# Patient Record
Sex: Male | Born: 1950 | Race: White | Hispanic: No | Smoking: Former smoker
Health system: Southern US, Community
[De-identification: ages and names within clinical notes are randomized; demographics above are authoritative.]

## PROBLEM LIST (undated history)

## (undated) DIAGNOSIS — I509 Heart failure, unspecified: Secondary | ICD-10-CM

## (undated) DIAGNOSIS — E785 Hyperlipidemia, unspecified: Secondary | ICD-10-CM

## (undated) DIAGNOSIS — N189 Chronic kidney disease, unspecified: Secondary | ICD-10-CM

## (undated) DIAGNOSIS — I10 Essential (primary) hypertension: Secondary | ICD-10-CM

## (undated) DIAGNOSIS — I351 Nonrheumatic aortic (valve) insufficiency: Secondary | ICD-10-CM

## (undated) DIAGNOSIS — R351 Nocturia: Secondary | ICD-10-CM

## (undated) DIAGNOSIS — R319 Hematuria, unspecified: Secondary | ICD-10-CM

## (undated) DIAGNOSIS — I429 Cardiomyopathy, unspecified: Secondary | ICD-10-CM

## (undated) DIAGNOSIS — I071 Rheumatic tricuspid insufficiency: Secondary | ICD-10-CM

## (undated) DIAGNOSIS — C801 Malignant (primary) neoplasm, unspecified: Secondary | ICD-10-CM

## (undated) DIAGNOSIS — I340 Nonrheumatic mitral (valve) insufficiency: Secondary | ICD-10-CM

## (undated) DIAGNOSIS — I480 Paroxysmal atrial fibrillation: Secondary | ICD-10-CM

## (undated) DIAGNOSIS — D494 Neoplasm of unspecified behavior of bladder: Secondary | ICD-10-CM

## (undated) DIAGNOSIS — I499 Cardiac arrhythmia, unspecified: Secondary | ICD-10-CM

## (undated) DIAGNOSIS — Z7901 Long term (current) use of anticoagulants: Secondary | ICD-10-CM

## (undated) DIAGNOSIS — I2510 Atherosclerotic heart disease of native coronary artery without angina pectoris: Secondary | ICD-10-CM

## (undated) HISTORY — PX: CARDIAC CATHETERIZATION: SHX172

---

## 2019-02-22 HISTORY — PX: CATARACT EXTRACTION W/ INTRAOCULAR LENS  IMPLANT, BILATERAL: SHX1307

## 2019-07-25 DIAGNOSIS — Z86718 Personal history of other venous thrombosis and embolism: Secondary | ICD-10-CM

## 2019-07-25 HISTORY — DX: Personal history of other venous thrombosis and embolism: Z86.718

## 2019-10-09 ENCOUNTER — Other Ambulatory Visit: Payer: Self-pay

## 2019-10-09 ENCOUNTER — Other Ambulatory Visit: Payer: Self-pay | Admitting: Urology

## 2019-10-09 ENCOUNTER — Encounter (HOSPITAL_BASED_OUTPATIENT_CLINIC_OR_DEPARTMENT_OTHER): Payer: Self-pay | Admitting: Urology

## 2019-10-09 MED ORDER — GEMCITABINE CHEMO FOR BLADDER INSTILLATION 2000 MG: 2000.0000 mg | Freq: Once | INTRAVENOUS | Status: AC

## 2019-10-09 NOTE — Progress Notes (Addendum)
ADDENDUM:  Received via fax from pt cardiologist lov note and ekg, stated had no echo. Reviewed this information with anesthesia, Konrad Felix PA,  Stated had to have the stress test and echo done ,as cardiologist recommended, prior to having this procedure. Called and left voice message with Alcus Dad, OR scheduler for Dr [name], informed her of anesthesia decision.   Spoke w/ via phone for pre-op interview--- PT Lab needs dos----  Istat 8             Lab results--- requested current ekg from pt's cardiologist to be faxed, stated was done 09-16-2019 COVID test ------ 10-10-2019 @ 1110 Arrive at ------- 0800 NPO after ------ [place] Medications to take morning of surgery ----- Metoprolol, Hydralazine, Dilt-XR, w/ sips of water Diabetic medication ----- n/a Patient Special Instructions ----- n/a Pre-Op special Istructions ----- called and requested pt's cardiology lov note, ekg, echo to be faxed.  Requested from dr winter office clearance to stop asa, pt had already stopped eliquis month ago. Patient verbalized understanding of instructions that were given at this phone interview. Patient denies shortness of breath, chest pain, fever, cough a this phone interview.   Anesthesia Review: hx AFib, HTN.  Pt denies any cardiac s&s, sob, or peripheral swelling. Chart to be reviewed by anesthesia when cardiology records received.  PCP: Dr Chesley Noon Cardiologist : Dr Ronnell Freshwater (per pt lov 09-16-2019 note requested) Chest x-ray : no EKG :  Per pt had one done at cardiology office 09-16-2019, requested Echo :  Requested last one from cardiology Cardiac Cath :  Per pt had one done at time of afib dx 2009 @ [facility], was told no blockage's Sleep Study/ CPAP :  NO Fasting Blood Sugar :      / Checks Blood Sugar -- times a day:   N/A Blood Thinner/ Instructions /Last Dose: Eliquis/  Per pt last dose approx. On month ago , put on hold due to hematuria ASA / Instructions/ Last Dose :   ASA  81mg /  Per pt was given instructions from dr winter office to stop 5 days prior to surgery, last dose to be 10-10-2019

## 2019-10-10 ENCOUNTER — Other Ambulatory Visit (HOSPITAL_COMMUNITY): Payer: Self-pay

## 2019-10-10 NOTE — Progress Notes (Signed)
Left voice mail message for Zachary Shannon patient still on surgery schedule for 10-14-2019.

## 2019-10-10 NOTE — Progress Notes (Signed)
Spoke with Konrad Felix pa medical clearance received for 10-14-2019 surgery, per Konrad Felix patient still needs cardiac clearance. Called and left voicemail message on [employer].

## 2019-10-13 ENCOUNTER — Encounter (HOSPITAL_COMMUNITY): Payer: Self-pay | Admitting: Physician Assistant

## 2019-10-13 ENCOUNTER — Inpatient Hospital Stay (HOSPITAL_COMMUNITY): Admission: RE | Admit: 2019-10-13 | Payer: Self-pay | Source: Ambulatory Visit

## 2019-10-13 NOTE — Progress Notes (Addendum)
Spoke with patient by phone and made aware per dr Christella Hartigan anesthesia patient cannot have surgery until stress test and echo done. Left voice mail message for connie per dr Christella Hartigan, patient cannot have surgery without stress test and echo being done.

## 2019-10-13 NOTE — Progress Notes (Signed)
Left voice mail message for Zachary Shannon  is patient getting cardiac clearance for 10-14-2019 surgery?

## 2019-10-13 NOTE — Progress Notes (Signed)
Spoke with Zachary Shannon at covid testing site and made aware patient coming for covid test at 1130 am today and surgery is tomorrow.

## 2019-10-14 ENCOUNTER — Ambulatory Visit (HOSPITAL_BASED_OUTPATIENT_CLINIC_OR_DEPARTMENT_OTHER): Admission: RE | Admit: 2019-10-14 | Payer: Medicare Other | Source: Ambulatory Visit | Admitting: Urology

## 2019-10-14 HISTORY — DX: Cardiomyopathy, unspecified: I42.9

## 2019-10-14 HISTORY — DX: Nocturia: R35.1

## 2019-10-14 HISTORY — DX: Neoplasm of unspecified behavior of bladder: D49.4

## 2019-10-14 HISTORY — DX: Hematuria, unspecified: R31.9

## 2019-10-14 HISTORY — DX: Essential (primary) hypertension: I10

## 2019-10-14 HISTORY — DX: Hyperlipidemia, unspecified: E78.5

## 2019-10-14 HISTORY — DX: Long term (current) use of anticoagulants: Z79.01

## 2019-10-14 HISTORY — DX: Atherosclerotic heart disease of native coronary artery without angina pectoris: I25.10

## 2019-10-14 HISTORY — DX: Paroxysmal atrial fibrillation: I48.0

## 2019-10-14 SURGERY — TURBT (TRANSURETHRAL RESECTION OF BLADDER TUMOR)
Anesthesia: General

## 2019-10-22 ENCOUNTER — Other Ambulatory Visit: Payer: Self-pay | Admitting: Urology

## 2019-10-22 NOTE — Progress Notes (Addendum)
Spoke w/ via phone for pre-op interview---patient Lab needs dos---- I stat 8              COVID test ------10-25-2019 1200 pm Arrive at -------930 am 10-29-2019 NPO after ------midnight Medications to take morning of surgery -----metoprolol, hydrazaline, diltiazem with sips of water Diabetic medication -----n/a Patient Special Instructions ----- Pre-Op special Istructions ----- Patient verbalized understanding of instructions that were given at this phone interview. Patient denies shortness of breath, chest pain, fever, cough a this phone interview.  Patient stated no changes to medications or medical history since history done 10-09-2019  Anesthesia Review: done 10-09-2019 by Janett Billow zanetto pa, received stress test report 10-22-2019 and reviewed by Janett Billow zanetto pa and ok to proceed per Janett Billow zanetto pa  PCP:dr martha cole Cardiologist :dr Salvatore Decent 09-16-2019 on chart, cardiac clearance note dr Felicie Morn 10-16-2019 on chart Chest x-ray : EKG :09-16-2019 dr Ashby Dawes on chart Stress test done 10-16-2019 dr Felicie Morn on chart Echo :none per cardiology Cardiac Cath : Per patient had one done at time of atrial fib dx 2009[facility]  [facility], was told no blockages Sleep Study/ CPAP :no Fasting Blood Sugar :      / Checks Blood Sugar -- times a day:  n/a Blood Thinner/ Instructions /Last Dose:patient not on eliquis at this time due to hematuria ASA / Instructions/ Last Dose : patient holding 81 mg aspirin 5 days prior to surgery per dr Felicie Morn and dr Chesley Noon  Patient denies shortness of breath, chest pain, fever, and cough at this phone interview.

## 2019-10-25 ENCOUNTER — Other Ambulatory Visit (HOSPITAL_COMMUNITY)
Admission: RE | Admit: 2019-10-25 | Discharge: 2019-10-25 | Disposition: A | Discharge status: Home / Self Care | Payer: Medicare Other | Source: Ambulatory Visit | Attending: Urology | Admitting: Urology

## 2019-10-25 DIAGNOSIS — Z20822 Contact with and (suspected) exposure to covid-19: Secondary | ICD-10-CM | POA: Insufficient documentation

## 2019-10-25 DIAGNOSIS — Z01812 Encounter for preprocedural laboratory examination: Secondary | ICD-10-CM | POA: Insufficient documentation

## 2019-10-25 LAB — SARS CORONAVIRUS 2 (TAT 6-24 HRS): SARS Coronavirus 2: NEGATIVE

## 2019-10-28 MED ORDER — DEXTROSE 5 % IV SOLN
3.0000 g | Freq: Once | INTRAVENOUS | Status: AC
  Administered 2019-10-29: 3 g via INTRAVENOUS
  Filled 2019-10-28 (×2): qty 3000

## 2019-10-28 NOTE — Anesthesia Preprocedure Evaluation (Addendum)
Anesthesia Evaluation  Patient identified by MRN, date of birth, ID band Patient awake    Reviewed: Allergy & Precautions, NPO status , Patient's Chart, lab work & pertinent test results, reviewed documented beta blocker date and time   History of Anesthesia Complications Negative for: history of anesthetic complications  Airway Mallampati: II  TM Distance: >3 FB Neck ROM: Full    Dental  (+) Missing,    Pulmonary neg pulmonary ROS, former smoker,    Pulmonary exam normal        Cardiovascular hypertension, Pt. on medications and Pt. on home beta blockers + CAD  Normal cardiovascular exam+ dysrhythmias (on Eliquis) Atrial Fibrillation   Stress echo 09/2019: EF 60%, low-intermediate risk study   Neuro/Psych negative neurological ROS  negative psych ROS   GI/Hepatic negative GI ROS, Neg liver ROS,   Endo/Other  negative endocrine ROS  Renal/GU negative Renal ROS  negative genitourinary   Musculoskeletal negative musculoskeletal ROS (+)   Abdominal   Peds  Hematology negative hematology ROS (+)   Anesthesia Other Findings Day of surgery medications reviewed with patient.  Reproductive/Obstetrics negative OB ROS                            Anesthesia Physical Anesthesia Plan  ASA: III  Anesthesia Plan: General   Post-op Pain Management:    Induction: Intravenous  PONV Risk Score and Plan: 2 and Treatment may vary due to age or medical condition, Ondansetron and Dexamethasone  Airway Management Planned: LMA  Additional Equipment: None  Intra-op Plan:   Post-operative Plan: Extubation in OR  Informed Consent: I have reviewed the patients History and Physical, chart, labs and discussed the procedure including the risks, benefits and alternatives for the proposed anesthesia with the patient or authorized representative who has indicated his/her understanding and acceptance.      Dental advisory given  Plan Discussed with: CRNA  Anesthesia Plan Comments:        Anesthesia Quick Evaluation

## 2019-10-29 ENCOUNTER — Ambulatory Visit (HOSPITAL_BASED_OUTPATIENT_CLINIC_OR_DEPARTMENT_OTHER)
Admission: RE | Admit: 2019-10-29 | Discharge: 2019-10-29 | Disposition: A | Discharge status: Home / Self Care | Payer: Medicare Other | Source: Ambulatory Visit | Attending: Urology | Admitting: Urology

## 2019-10-29 ENCOUNTER — Ambulatory Visit (HOSPITAL_BASED_OUTPATIENT_CLINIC_OR_DEPARTMENT_OTHER): Payer: Medicare Other | Admitting: Anesthesiology

## 2019-10-29 ENCOUNTER — Encounter (HOSPITAL_BASED_OUTPATIENT_CLINIC_OR_DEPARTMENT_OTHER): Payer: Self-pay | Admitting: Urology

## 2019-10-29 ENCOUNTER — Encounter (HOSPITAL_BASED_OUTPATIENT_CLINIC_OR_DEPARTMENT_OTHER)
Admission: RE | Disposition: A | Discharge status: Home / Self Care | Payer: Self-pay | Source: Ambulatory Visit | Attending: Urology

## 2019-10-29 ENCOUNTER — Other Ambulatory Visit: Payer: Self-pay

## 2019-10-29 DIAGNOSIS — Z7901 Long term (current) use of anticoagulants: Secondary | ICD-10-CM | POA: Diagnosis not present

## 2019-10-29 DIAGNOSIS — I429 Cardiomyopathy, unspecified: Secondary | ICD-10-CM | POA: Diagnosis not present

## 2019-10-29 DIAGNOSIS — I10 Essential (primary) hypertension: Secondary | ICD-10-CM | POA: Insufficient documentation

## 2019-10-29 DIAGNOSIS — I2510 Atherosclerotic heart disease of native coronary artery without angina pectoris: Secondary | ICD-10-CM | POA: Insufficient documentation

## 2019-10-29 DIAGNOSIS — R310 Gross hematuria: Secondary | ICD-10-CM | POA: Insufficient documentation

## 2019-10-29 DIAGNOSIS — N35911 Unspecified urethral stricture, male, meatal: Secondary | ICD-10-CM | POA: Insufficient documentation

## 2019-10-29 DIAGNOSIS — C678 Malignant neoplasm of overlapping sites of bladder: Secondary | ICD-10-CM | POA: Diagnosis not present

## 2019-10-29 DIAGNOSIS — I480 Paroxysmal atrial fibrillation: Secondary | ICD-10-CM | POA: Insufficient documentation

## 2019-10-29 DIAGNOSIS — N400 Enlarged prostate without lower urinary tract symptoms: Secondary | ICD-10-CM | POA: Diagnosis not present

## 2019-10-29 DIAGNOSIS — Z87891 Personal history of nicotine dependence: Secondary | ICD-10-CM | POA: Insufficient documentation

## 2019-10-29 DIAGNOSIS — N329 Bladder disorder, unspecified: Secondary | ICD-10-CM | POA: Diagnosis present

## 2019-10-29 HISTORY — PX: TRANSURETHRAL RESECTION OF BLADDER TUMOR: SHX2575

## 2019-10-29 LAB — POCT I-STAT, CHEM 8
BUN: 18 mg/dL (ref 8–23)
Calcium, Ion: 1.26 mmol/L (ref 1.15–1.40)
Chloride: 106 mmol/L (ref 98–111)
Creatinine, Ser: 1.5 mg/dL — ABNORMAL HIGH (ref 0.61–1.24)
Glucose, Bld: 112 mg/dL — ABNORMAL HIGH (ref 70–99)
HCT: 39 % (ref 39.0–52.0)
Hemoglobin: 13.3 g/dL (ref 13.0–17.0)
Potassium: 4.3 mmol/L (ref 3.5–5.1)
Sodium: 141 mmol/L (ref 135–145)
TCO2: 25 mmol/L (ref 22–32)

## 2019-10-29 SURGERY — TURBT (TRANSURETHRAL RESECTION OF BLADDER TUMOR)
Anesthesia: General | Site: Bladder

## 2019-10-29 MED ORDER — FENTANYL CITRATE (PF) 100 MCG/2ML IJ SOLN
INTRAMUSCULAR | Status: AC
  Filled 2019-10-29: qty 2

## 2019-10-29 MED ORDER — FENTANYL CITRATE (PF) 100 MCG/2ML IJ SOLN
INTRAMUSCULAR | Status: DC | PRN
  Administered 2019-10-29: 12.5 ug via INTRAVENOUS
  Administered 2019-10-29: 25 ug via INTRAVENOUS
  Administered 2019-10-29: 12.5 ug via INTRAVENOUS
  Administered 2019-10-29: 50 ug via INTRAVENOUS
  Administered 2019-10-29: 100 ug via INTRAVENOUS

## 2019-10-29 MED ORDER — DEXAMETHASONE SODIUM PHOSPHATE 10 MG/ML IJ SOLN
INTRAMUSCULAR | Status: DC | PRN
  Administered 2019-10-29: 5 mg via INTRAVENOUS

## 2019-10-29 MED ORDER — PHENYLEPHRINE HCL (PRESSORS) 10 MG/ML IV SOLN
INTRAVENOUS | Status: DC | PRN
  Administered 2019-10-29 (×14): 40 ug via INTRAVENOUS

## 2019-10-29 MED ORDER — DEXAMETHASONE SODIUM PHOSPHATE 10 MG/ML IJ SOLN
INTRAMUSCULAR | Status: AC
  Filled 2019-10-29: qty 1

## 2019-10-29 MED ORDER — GLYCOPYRROLATE 0.2 MG/ML IJ SOLN
INTRAMUSCULAR | Status: DC | PRN
  Administered 2019-10-29: .2 mg via INTRAVENOUS

## 2019-10-29 MED ORDER — PHENYLEPHRINE 40 MCG/ML (10ML) SYRINGE FOR IV PUSH (FOR BLOOD PRESSURE SUPPORT)
PREFILLED_SYRINGE | INTRAVENOUS | Status: AC
  Filled 2019-10-29: qty 10

## 2019-10-29 MED ORDER — PROPOFOL 10 MG/ML IV BOLUS
INTRAVENOUS | Status: AC
  Filled 2019-10-29: qty 40

## 2019-10-29 MED ORDER — GLYCOPYRROLATE PF 0.2 MG/ML IJ SOSY
PREFILLED_SYRINGE | INTRAMUSCULAR | Status: AC
  Filled 2019-10-29: qty 1

## 2019-10-29 MED ORDER — OXYCODONE HCL 5 MG/5ML PO SOLN
5.0000 mg | Freq: Once | ORAL | Status: DC | PRN
  Filled 2019-10-29: qty 5

## 2019-10-29 MED ORDER — LIDOCAINE 2% (20 MG/ML) 5 ML SYRINGE
INTRAMUSCULAR | Status: AC
  Filled 2019-10-29: qty 5

## 2019-10-29 MED ORDER — CEPHALEXIN 500 MG PO CAPS: 500.0000 mg | ORAL_CAPSULE | Freq: Two times a day (BID) | ORAL | 0 refills | Status: AC

## 2019-10-29 MED ORDER — LACTATED RINGERS IV SOLN
INTRAVENOUS | Status: DC
  Administered 2019-10-29: 50 mL/h via INTRAVENOUS
  Filled 2019-10-29 (×2): qty 1000

## 2019-10-29 MED ORDER — ONDANSETRON HCL 4 MG/2ML IJ SOLN
INTRAMUSCULAR | Status: AC
  Filled 2019-10-29: qty 2

## 2019-10-29 MED ORDER — CEFAZOLIN SODIUM-DEXTROSE 1-4 GM/50ML-% IV SOLN
INTRAVENOUS | Status: AC
  Filled 2019-10-29: qty 50

## 2019-10-29 MED ORDER — LIDOCAINE 2% (20 MG/ML) 5 ML SYRINGE
INTRAMUSCULAR | Status: DC | PRN
  Administered 2019-10-29: 100 mg via INTRAVENOUS

## 2019-10-29 MED ORDER — BELLADONNA ALKALOIDS-OPIUM 16.2-60 MG RE SUPP
RECTAL | Status: AC
  Filled 2019-10-29: qty 1

## 2019-10-29 MED ORDER — ONDANSETRON HCL 4 MG/2ML IJ SOLN
INTRAMUSCULAR | Status: DC | PRN
  Administered 2019-10-29: 4 mg via INTRAVENOUS

## 2019-10-29 MED ORDER — BELLADONNA ALKALOIDS-OPIUM 16.2-60 MG RE SUPP
RECTAL | Status: DC | PRN
  Administered 2019-10-29: 1 via RECTAL

## 2019-10-29 MED ORDER — GEMCITABINE CHEMO FOR BLADDER INSTILLATION 2000 MG
2000.0000 mg | Freq: Once | INTRAVENOUS | Status: AC
  Administered 2019-10-29: 2000 mg via INTRAVESICAL
  Filled 2019-10-29: qty 2000

## 2019-10-29 MED ORDER — EPHEDRINE SULFATE 50 MG/ML IJ SOLN
INTRAMUSCULAR | Status: DC | PRN
  Administered 2019-10-29: 20 mg via INTRAVENOUS

## 2019-10-29 MED ORDER — OXYBUTYNIN CHLORIDE 5 MG PO TABS: 5.0000 mg | ORAL_TABLET | Freq: Three times a day (TID) | ORAL | 1 refills | Status: DC | PRN

## 2019-10-29 MED ORDER — OXYCODONE HCL 5 MG PO TABS
5.0000 mg | ORAL_TABLET | Freq: Once | ORAL | Status: DC | PRN
  Filled 2019-10-29: qty 1

## 2019-10-29 MED ORDER — PROPOFOL 10 MG/ML IV BOLUS
INTRAVENOUS | Status: DC | PRN
  Administered 2019-10-29: 50 mg via INTRAVENOUS
  Administered 2019-10-29: 180 mg via INTRAVENOUS
  Administered 2019-10-29: 50 mg via INTRAVENOUS
  Administered 2019-10-29: 20 mg via INTRAVENOUS
  Administered 2019-10-29: 50 mg via INTRAVENOUS

## 2019-10-29 MED ORDER — EPHEDRINE 5 MG/ML INJ
INTRAVENOUS | Status: AC
  Filled 2019-10-29: qty 10

## 2019-10-29 MED ORDER — ACETAMINOPHEN 500 MG PO TABS
1000.0000 mg | ORAL_TABLET | Freq: Once | ORAL | Status: AC
  Administered 2019-10-29: 1000 mg via ORAL
  Filled 2019-10-29: qty 2

## 2019-10-29 MED ORDER — ACETAMINOPHEN 500 MG PO TABS
ORAL_TABLET | ORAL | Status: AC
  Filled 2019-10-29: qty 2

## 2019-10-29 MED ORDER — CEFAZOLIN SODIUM-DEXTROSE 2-4 GM/100ML-% IV SOLN
INTRAVENOUS | Status: AC
  Filled 2019-10-29: qty 100

## 2019-10-29 MED ORDER — SODIUM CHLORIDE 0.9 % IR SOLN
Status: DC | PRN
  Administered 2019-10-29 (×9): 3000 mL via INTRAVESICAL

## 2019-10-29 MED ORDER — HYDROCODONE-ACETAMINOPHEN 5-325 MG PO TABS: 1.0000 | ORAL_TABLET | ORAL | 0 refills | Status: DC | PRN

## 2019-10-29 MED ORDER — PROMETHAZINE HCL 25 MG/ML IJ SOLN
6.2500 mg | INTRAMUSCULAR | Status: DC | PRN
  Filled 2019-10-29: qty 1

## 2019-10-29 MED ORDER — FENTANYL CITRATE (PF) 100 MCG/2ML IJ SOLN
25.0000 ug | INTRAMUSCULAR | Status: DC | PRN
  Filled 2019-10-29: qty 1

## 2019-10-29 MED ORDER — PHENAZOPYRIDINE HCL 200 MG PO TABS: 200.0000 mg | ORAL_TABLET | Freq: Three times a day (TID) | ORAL | 0 refills | Status: DC | PRN

## 2019-10-29 SURGICAL SUPPLY — 25 items
BAG DRAIN URO-CYSTO SKYTR STRL (DRAIN) ×3 IMPLANT
BAG URINE DRAIN 2000ML AR STRL (UROLOGICAL SUPPLIES) IMPLANT
BAG URINE LEG 500ML (DRAIN) IMPLANT
CATH FOLEY 2WAY SLVR  5CC 20FR (CATHETERS) ×2
CATH FOLEY 2WAY SLVR 5CC 20FR (CATHETERS) IMPLANT
GLOVE BIO SURGEON STRL SZ 6.5 (GLOVE) ×1 IMPLANT
GLOVE BIO SURGEON STRL SZ7.5 (GLOVE) ×3 IMPLANT
GLOVE BIO SURGEONS STRL SZ 6.5 (GLOVE) ×1
GLOVE BIOGEL PI IND STRL 6.5 (GLOVE) IMPLANT
GLOVE BIOGEL PI INDICATOR 6.5 (GLOVE) ×2
GOWN STRL REUS W/TWL LRG LVL3 (GOWN DISPOSABLE) ×2 IMPLANT
GOWN STRL REUS W/TWL XL LVL3 (GOWN DISPOSABLE) ×3 IMPLANT
HOLDER FOLEY CATH W/STRAP (MISCELLANEOUS) ×2 IMPLANT
IV NS IRRIG 3000ML ARTHROMATIC (IV SOLUTION) ×7 IMPLANT
LOOP CUT BIPOLAR 24F LRG (ELECTROSURGICAL) IMPLANT
MANIFOLD NEPTUNE II (INSTRUMENTS) ×3 IMPLANT
NS IRRIG 500ML POUR BTL (IV SOLUTION) IMPLANT
PACK CYSTO (CUSTOM PROCEDURE TRAY) ×3 IMPLANT
SYR TOOMEY IRRIG 70ML (MISCELLANEOUS)
SYRINGE TOOMEY IRRIG 70ML (MISCELLANEOUS) IMPLANT
TUBE CONNECTING 12'X1/4 (SUCTIONS) ×1
TUBE CONNECTING 12X1/4 (SUCTIONS) ×2 IMPLANT
TUBING UROLOGY SET (TUBING) ×3 IMPLANT
WATER STERILE IRR 3000ML UROMA (IV SOLUTION) IMPLANT
WATER STERILE IRR 500ML POUR (IV SOLUTION) ×2 IMPLANT

## 2019-10-29 NOTE — Anesthesia Procedure Notes (Signed)
Procedure Name: LMA Insertion Date/Time: 10/29/2019 10:59 AM Performed by: Justice Rocher, CRNA Pre-anesthesia Checklist: Patient identified, Emergency Drugs available, Suction available, Patient being monitored and Timeout performed Patient Re-evaluated:Patient Re-evaluated prior to induction Oxygen Delivery Method: Circle system utilized Preoxygenation: Pre-oxygenation with 100% oxygen Induction Type: IV induction Ventilation: Mask ventilation without difficulty LMA: LMA inserted LMA Size: 5.0 Number of attempts: 1 Airway Equipment and Method: Bite block Placement Confirmation: positive ETCO2,  CO2 detector and breath sounds checked- equal and bilateral Tube secured with: Tape Dental Injury: Teeth and Oropharynx as per pre-operative assessment

## 2019-10-29 NOTE — Anesthesia Postprocedure Evaluation (Signed)
Anesthesia Post Note  Patient: Zachary Shannon.  Procedure(s) Performed: TRANSURETHRAL RESECTION OF BLADDER TUMOR  (TURBT), CYSTOSCOPY/ INTRAVESICAL INSTILLATION OF GEMCITABINE (N/A Bladder)     Patient location during evaluation: PACU Anesthesia Type: General Level of consciousness: awake and alert and oriented Pain management: pain level controlled Vital Signs Assessment: post-procedure vital signs reviewed and stable Respiratory status: spontaneous breathing, nonlabored ventilation and respiratory function stable Cardiovascular status: blood pressure returned to baseline Postop Assessment: no apparent nausea or vomiting Anesthetic complications: no    Last Vitals:  Vitals:   10/29/19 1232 10/29/19 1245  BP: 116/78 105/64  Pulse: 71 76  Resp: 14 15  Temp: 36.8 C   SpO2: 92% 92%    Last Pain:  Vitals:   10/29/19 1232  TempSrc:   PainSc: Meta E Calliope Delangel

## 2019-10-29 NOTE — Op Note (Addendum)
Operative Note  Preoperative diagnosis:  1.  Multiple bladder lesions noted on recent CT 2.  Gross hematuria  Postoperative diagnosis: 1.  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  Meatal stenosis  Procedure(s): 1.  TURBT of 6 cm bladder tumor (large) 2.  Intravesical instillation of gemcitabine 3.  Meatal dilation  Surgeon: Ellison Hughs, MD  Assistants:  None  Anesthesia:  General  Complications:  None  EBL: 150 mL  Specimens: 1.  Superficial and deep bladder tumor margins  Drains/Catheters: 1.  20 French two-way Foley catheter with 10 mL in the balloon  Intraoperative findings:   1.  As above.  The resection was made difficult by the anterior extent of the bladder tumor.  The tumor was immediately adjacent to the left ureteral orifice, but not directly involving it.  The left ureteral orifice was preserved and was effluxing clear urine at the conclusion of the case. 2.  No visual evidence of bladder perforation upon complete bladder tumor removal 3.  Meatal stenosis requiring urethral dilation using Leander Rams sounds.  Indication:  Zachary Shannon. is a 69 y.o. 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  The patient has been consented for the above procedures, voices understanding and wishes to proceed.  Description of procedure:  After informed consent was obtained, the patient was brought to the operating room and general LMA anesthesia was administered. The patient was then placed in the dorsolithotomy position and prepped and draped in the usual sterile fashion. A timeout was performed. A 23 French rigid cystoscope was then inserted into the urethral meatus and advanced into the bladder under  direct vision. A complete bladder survey revealed the findings listed above.  Due to meatal stenosis, the patient's urethra was dilated with Leander Rams sounds, starting at 55 [place] and progressing up to 28 [place], and 2 [place] increments.  Following urethral dilation, a 20 French resectoscope was then inserted into the urethra and advanced up into the bladder.  His large papillary bladder tumor was then systematically resected, starting superficially and then progressing down to the detrusor musculature.  The superficial bladder tumor was sent off as a separate specimen from the deep margin for permanent section.  Reinspection of the bladder revealed all obvious tumor had been fully resected and there was no evidence of perforation. The Microvasive evacuator was then used to irrigate the bladder and remove all of the portions of bladder tumor which were sent to pathology. I then removed the resectoscope.  A 20 French Foley catheter was then inserted in the bladder and irrigated. The irrigant returned slightly pink with no clots. The patient was awakened and taken to the recovery room.  While in the recovery room 2000 mg of gemcitabine in 50 mL of water was instilled in the bladder through the catheter and the catheter was plugged. This will remain indwelling for approximately one hour. It will then be drained from the bladder and the catheter will be removed and the patient discharged home.  Plan: The patient will keep his Foley catheter in until the morning of 11/03/2019.

## 2019-10-29 NOTE — Discharge Instructions (Signed)
Indwelling Urinary Catheter Insertion, Care After This sheet gives you information about how to care for yourself after your procedure. Your health care provider may also give you more specific instructions. If you have problems or questions, contact your health care provider. What can I expect after the procedure? After the procedure, it is common to have:  Slight discomfort around your urethra where the catheter enters your body. Follow these instructions at home:   Keep the drainage bag at or below the level of your bladder. Doing this ensures that urine can only drain out, not back into your body.  Secure the catheter tubing and drainage bag to your leg or thigh to keep it from moving.  Check the catheter tubing regularly to make sure there are no kinks or blockages.  Take showers daily to keep the catheter clean. Do not take a bath.  Do not pull on your catheter or try to remove it.  Disconnect the tubing and drainage bag as little as possible.  Empty the drainage bag every 2-4 hours, or more often if needed. Do not let the bag get completely full.  Wash your hands with soap and water before and after touching the catheter, tubing, or drainage bag.  Do not let the drainage bag or catheter tubing touch the floor.  Drink enough fluids to keep your urine clear or pale yellow, or as told by your health care provider. Contact a health care provider if:  Urine stops flowing into the drainage bag.  You feel pain or pressure in the bladder area.  You have back pain.  Your catheter gets clogged.  Your catheter starts to leak.  Your urine looks cloudy.  Your drainage bag or tubing looks dirty.  You notice a bad smell when emptying your drainage bag. Get help right away if:  You have a fever or chills.  You have severe pain in your back or your lower abdomen.  You have warmth, redness, swelling, or pain in the urethra area.  You notice blood in your urine.  Your  catheter gets pulled out. Summary  Do not pull on your catheter or try to remove it.  Keep the drainage bag at or below the level of your bladder, but do not let the drainage bag or catheter tubing touch the floor.  Wash your hands with soap and water before and after touching the catheter, tubing, or drainage bag.  Contact your health care provider if you have a fever, chills, or any other signs of infection. This information is not intended to replace advice given to you by your health care provider. Make sure you discuss any questions you have with your health care provider. Document Revised: 11/01/2018 Document Reviewed: 08/19/2016 Elsevier Patient Education  [place] Instructions  Activity: Get plenty of rest for the remainder of the day. A responsible individual must stay with you for 24 hours following the procedure.  For the next 24 hours, DO NOT: -Drive a car -Paediatric nurse -Drink alcoholic beverages -Take any medication unless instructed by your physician -Make any legal decisions or sign important papers.  Meals: Start with liquid foods such as gelatin or soup. Progress to regular foods as tolerated. Avoid greasy, spicy, heavy foods. If nausea and/or vomiting occur, drink only clear liquids until the nausea and/or vomiting subsides. Call your physician if vomiting continues.  Special Instructions/Symptoms: Your throat may feel dry or sore from the anesthesia or the breathing tube  placed in your throat during surgery. If this causes discomfort, gargle with warm salt water. The discomfort should disappear within 24 hours.  If you had a scopolamine patch placed behind your ear for the management of post- operative nausea and/or vomiting:  1. The medication in the patch is effective for 72 hours, after which it should be removed.  Wrap patch in a tissue and discard in the trash. Wash hands thoroughly with soap and water. 2. You  may remove the patch earlier than 72 hours if you experience unpleasant side effects which may include dry mouth, dizziness or visual disturbances. 3. Avoid touching the patch. Wash your hands with soap and water after contact with the patch.

## 2019-10-29 NOTE — Transfer of Care (Signed)
Immediate Anesthesia Transfer of Care Note  Patient: Zachary Shannon.  Procedure(s) Performed: Procedure(s) (LRB): TRANSURETHRAL RESECTION OF BLADDER TUMOR  (TURBT), CYSTOSCOPY/ INTRAVESICAL INSTILLATION OF GEMCITABINE (N/A)  Patient Location: PACU  Anesthesia Type: General  Level of Consciousness: awake, sedated, patient cooperative and responds to stimulation  Airway & Oxygen Therapy: Patient Spontanous Breathing and Patient connected to [place] and soft FM   Post-op Assessment: Report given to PACU RN, Post -op Vital signs reviewed and stable and Patient moving all extremities  Post vital signs: Reviewed and stable  Complications: No apparent anesthesia complications

## 2019-10-29 NOTE — H&P (Signed)
Urology Preoperative H&P   Chief Complaint: bladder tumors  History of Present Illness: Zachary Bhatnagar. is a 69 y.o. male with male with a history of gross hematuria. PMhx of A-fib (on eliquis and aspirin), HTN and HLD. 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  He is here today for further cystoscopy evaluation and resection of the bladder lesions noted on his CT.  He reports that he is urinating without difficulty and denies interval episodes of hematuria or dysuria.    Past Medical History:  Diagnosis Date  . Anticoagulant long-term use    eliquis--- managed by cardiology  . Bladder tumor   . Cardiomyopathy [facility])    dx 2009 per cardiac cath w/ ef 20-25%;   per cardiology note   . Coronary artery disease cardiologist--- dr Ronnell Freshwater  ([facility] in [place] )   per cardiologist note dated 09-16-2019 , received via fax,  pt had cardiac cath 2009 done at [facility] that showed CTO of LAD with excellent collateralization from LCx and RCA, no PCI performed, sig. cardiomyopathy ef 20-25%;  per note stated improved to 45-50% (no documentation available in note and no echo)  . Hematuria   . Hyperlipidemia   . Hypertension    followed by pcp  . Nocturia   . PAF (paroxysmal atrial fibrillation) [facility])    cardiologist--- dr Ronnell Freshwater--- dx 2009    Past Surgical History:  Procedure Laterality Date  . CARDIAC CATHETERIZATION  2009   @[facility]   per pt done due to dx AFib;  told no blockage's  . CATARACT EXTRACTION W/ INTRAOCULAR LENS  IMPLANT, BILATERAL  02/2019    Allergies: No Known Allergies  History reviewed. No pertinent family history.  Social History:  reports that he quit smoking about 11 years ago. His smoking use included cigarettes. He has a 14.00 pack-year smoking history. He quit smokeless tobacco use about 11 years ago. He  reports that he does not drink alcohol or use drugs.  ROS: A complete review of systems was performed.  All systems are negative except for pertinent findings as noted.  Physical Exam:  Vital signs in last 24 hours:   Constitutional:  Alert and oriented, No acute distress Cardiovascular: Regular rate and rhythm, No JVD Respiratory: Normal respiratory effort, Lungs clear bilaterally GI: Abdomen is soft, nontender, nondistended, no abdominal masses GU: No CVA tenderness Lymphatic: No lymphadenopathy Neurologic: Grossly intact, no focal deficits Psychiatric: Normal mood and affect  Laboratory Data:  No results for input(s): WBC, HGB, HCT, PLT in the last 72 hours.  No results for input(s): NA, K, CL, GLUCOSE, BUN, CALCIUM, CREATININE in the last 72 hours.  Invalid input(s): CO3   No results found for this or any previous visit (from the past 24 hour(s)). Recent Results (from the past 240 hour(s))  SARS CORONAVIRUS 2 (TAT 6-24 HRS) Nasopharyngeal Nasopharyngeal Swab     Status: None   Collection Time: 10/25/19  1:28 PM   Specimen: Nasopharyngeal Swab  Result Value Ref Range Status   SARS Coronavirus 2 NEGATIVE NEGATIVE Final    Comment: (NOTE) SARS-CoV-2 target nucleic acids are NOT DETECTED. The SARS-CoV-2 RNA is generally detectable in upper and lower respiratory specimens during the acute phase of infection. Negative results do not preclude SARS-CoV-2 infection, do not rule out co-infections with other pathogens, and should not be used as the sole basis for treatment or other patient  management decisions. Negative results must be combined with clinical observations, patient history, and epidemiological information. The expected result is Negative. Fact Sheet for Patients: SugarRoll.be Fact Sheet for Healthcare Providers: https://www.woods-mathews.com/ This test is not yet approved or cleared by the [place] FDA and  has been  authorized for detection and/or diagnosis of SARS-CoV-2 by FDA under an Emergency Use Authorization (EUA). This EUA will remain  in effect (meaning this test can be used) for the duration of the COVID-19 declaration under Section 56 4(b)(1) of the Act, 21 U.S.C. section 360bbb-3(b)(1), unless the authorization is terminated or revoked sooner. Performed at [facility], [address] [place], [place] [address]     Renal Function: No results for input(s): CREATININE in the last 168 hours. CrCl cannot be calculated (No successful lab value found.).  Radiologic Imaging: CLINICAL DATA:  Gross hematuria.     EXAM:  CT ABDOMEN AND PELVIS WITHOUT AND WITH CONTRAST     TECHNIQUE:  Multidetector CT imaging of the abdomen and pelvis was performed  following the standard protocol before and following the bolus  administration of intravenous contrast.     CONTRAST:  125 cc Omnipaque 300     COMPARISON:  None     FINDINGS:  Lower chest: No acute abnormality.     Hepatobiliary: No focal liver abnormality is seen. Stones are  identified within the neck of gallbladder measuring up to 4 mm. No  gallbladder wall inflammation. No biliary ductal dilatation.     Pancreas: Unremarkable. No pancreatic ductal dilatation or  surrounding inflammatory changes.     Spleen: Normal in size without focal abnormality.     Adrenals/Urinary Tract: Normal appearance of the adrenal glands.     No kidney stone identified bilaterally. No hydronephrosis. Scarring  is identified within the inferior pole of the right kidney. No  kidney mass or hydronephrosis identified bilaterally. Symmetric  opacification of the collecting systems on the delayed images. No  filling defects identified within the collecting systems or ureters.     Multiple bladder lesions are identified. At least 3 distinct lesions  are noted involving the urinary bladder:     -in the region of the trigone there is a mass which  measures 3.1 x  2.8 cm, image 35/11.     -Lesion arising from the left bladder wall measures 3.2 x 2.9 cm,  image 31/11.     -Posterior dome of bladder lesion measures 0.8 x 0.6 cm, image  28/11.     Stomach/Bowel: Stomach is within normal limits. No evidence of bowel  wall thickening, distention, or inflammatory changes. Distal colonic  diverticulosis without acute inflammation.     Vascular/Lymphatic: Aortic atherosclerosis. No aneurysm. No  abdominal or pelvic adenopathy.     Reproductive: Prostate is unremarkable.     Other: No free fluid or fluid collections. Right inguinal hernia  contains a nonobstructed loop of small bowel. Left inguinal hernia  contains fat only.     Musculoskeletal: No acute or significant osseous findings.     IMPRESSION:  1. Multifocal bladder lesions are identified compatible with  multifocal urothelial neoplasm. No evidence for upper tract  obstruction.  2. Gallstones.  3. Right inguinal hernia contains a nonobstructed loop of small  bowel. Left inguinal hernia contains fat only.     Aortic Atherosclerosis (ICD10-I70.0).        Electronically Signed    By: Kerby Moors M.D.    On: 09/22/2019 15:36   I independently  reviewed the above imaging studies.  Assessment and Plan Zachary Franklin Andria Diperna. is a 69 y.o. 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 The risks include, but are not limited to, bleeding, urinary tract infection, bladder perforation requiring prolonged catheterization and/or open bladder repair, ureteral obstruction, voiding dysfunction and the inherent risks of general anesthesia. The patient voices understanding and wishes to proceed.    Zachary Hughs, MD 10/29/2019, 10:10 AM  [facility] Pager: [phone]

## 2019-10-30 LAB — SURGICAL PATHOLOGY

## 2020-07-08 ENCOUNTER — Other Ambulatory Visit: Payer: Self-pay | Admitting: Urology

## 2020-07-12 NOTE — Patient Instructions (Signed)
DUE TO COVID-19 ONLY ONE VISITOR IS ALLOWED TO COME WITH YOU AND STAY IN THE WAITING ROOM ONLY DURING PRE OP AND PROCEDURE DAY OF SURGERY. THE 1 VISITOR  MAY VISIT WITH YOU AFTER SURGERY IN YOUR PRIVATE ROOM DURING VISITING HOURS ONLY!  YOU NEED TO HAVE A COVID 19 TEST ON: 07/13/20, THIS TEST MUST BE DONE BEFORE SURGERY,  COVID TESTING SITE [address] [place] [address], IT IS ON THE RIGHT GOING OUT [address] APPROXIMATELY  2 MINUTES PAST [employer] ON THE RIGHT. ONCE YOUR COVID TEST IS COMPLETED,  PLEASE BEGIN THE QUARANTINE INSTRUCTIONS AS OUTLINED IN YOUR HANDOUT.                Zachary Shannon.   Your procedure is scheduled on: 07/14/20   Report to [facility] Main  Entrance   Report to admitting at: 8:30 AM     Call this number if you have problems the morning of surgery [phone]    Remember: Do not eat solid food :After Midnight. Clear liquids until: 7:30 am.  CLEAR LIQUID DIET   Foods Allowed                                                                     Foods Excluded  Coffee and tea, regular and decaf                             liquids that you cannot  Plain Jell-O any favor except red or purple                                           see through such as: Fruit ices (not with fruit pulp)                                     milk, soups, orange juice  Iced Popsicles                                    All solid food Carbonated beverages, regular and diet                                    Cranberry, grape and apple juices Sports drinks like Gatorade Lightly seasoned clear broth or consume(fat free) Sugar, honey syrup  Sample Menu Breakfast                                Lunch                                     Supper Cranberry juice  Beef broth                            Chicken broth Jell-O                                     Grape juice                           Apple juice Coffee or tea                         Jell-O                                      Popsicle                                                Coffee or tea                        Coffee or tea  _____________________________________________________________________  BRUSH YOUR TEETH MORNING OF SURGERY AND RINSE YOUR MOUTH OUT, NO CHEWING GUM CANDY OR MINTS.    Take these medicines the morning of surgery with A SIP OF WATER: Diltiazem,hydralazine,metoprolol.                               You may not have any metal on your body including hair pins and              piercings  Do not wear jewelry, lotions, powders or perfumes, deodorant             Men may shave face and neck.   Do not bring valuables to the hospital. [name].  Contacts, dentures or bridgework may not be worn into surgery.  Leave suitcase in the car. After surgery it may be brought to your room.     Patients discharged the day of surgery will not be allowed to drive home. IF YOU ARE HAVING SURGERY AND GOING HOME THE SAME DAY, YOU MUST HAVE AN ADULT TO DRIVE YOU HOME AND BE WITH YOU FOR 24 HOURS. YOU MAY GO HOME BY TAXI OR UBER OR ORTHERWISE, BUT AN ADULT MUST ACCOMPANY YOU HOME AND STAY WITH YOU FOR 24 HOURS.  Name and phone number of your driver:  Special Instructions: N/A              Please read over the following fact sheets you were given: _____________________________________________________________________         [facility] - Preparing for Surgery Before surgery, you can play an important role.  Because skin is not sterile, your skin needs to be as free of germs as possible.  You can reduce the number of germs on your skin by washing with CHG (chlorahexidine gluconate) soap before surgery.  CHG is an antiseptic cleaner which kills germs and bonds with the skin to continue killing germs even after  washing. Please DO NOT use if you have an allergy to CHG or antibacterial soaps.  If your skin  becomes reddened/irritated stop using the CHG and inform your nurse when you arrive at Short Stay. Do not shave (including legs and underarms) for at least 48 hours prior to the first CHG shower.  You may shave your face/neck. Please follow these instructions carefully:  1.  Shower with CHG Soap the night before surgery and the  morning of Surgery.  2.  If you choose to wash your hair, wash your hair first as usual with your  normal  shampoo.  3.  After you shampoo, rinse your hair and body thoroughly to remove the  shampoo.                           4.  Use CHG as you would any other liquid soap.  You can apply chg directly  to the skin and wash                       Gently with a scrungie or clean washcloth.  5.  Apply the CHG Soap to your body ONLY FROM THE NECK DOWN.   Do not use on face/ open                           Wound or open sores. Avoid contact with eyes, ears mouth and genitals (private parts).                       Wash face,  Genitals (private parts) with your normal soap.             6.  Wash thoroughly, paying special attention to the area where your surgery  will be performed.  7.  Thoroughly rinse your body with warm water from the neck down.  8.  DO NOT shower/wash with your normal soap after using and rinsing off  the CHG Soap.                9.  Pat yourself dry with a clean towel.            10.  Wear clean pajamas.            11.  Place clean sheets on your bed the night of your first shower and do not  sleep with pets. Day of Surgery : Do not apply any lotions/deodorants the morning of surgery.  Please wear clean clothes to the hospital/surgery center.  FAILURE TO FOLLOW THESE INSTRUCTIONS MAY RESULT IN THE CANCELLATION OF YOUR SURGERY PATIENT SIGNATURE_________________________________  NURSE SIGNATURE__________________________________  ________________________________________________________________________

## 2020-07-13 ENCOUNTER — Encounter (HOSPITAL_COMMUNITY): Payer: Self-pay

## 2020-07-13 ENCOUNTER — Other Ambulatory Visit (HOSPITAL_COMMUNITY)
Admission: RE | Admit: 2020-07-13 | Discharge: 2020-07-13 | Disposition: A | Discharge status: Home / Self Care | Payer: Medicare Other | Source: Ambulatory Visit | Attending: Urology | Admitting: Urology

## 2020-07-13 ENCOUNTER — Other Ambulatory Visit: Payer: Self-pay

## 2020-07-13 ENCOUNTER — Encounter (HOSPITAL_COMMUNITY)
Admission: RE | Admit: 2020-07-13 | Discharge: 2020-07-13 | Disposition: A | Discharge status: Home / Self Care | Payer: Medicare Other | Source: Ambulatory Visit | Attending: Urology | Admitting: Urology

## 2020-07-13 DIAGNOSIS — Z20822 Contact with and (suspected) exposure to covid-19: Secondary | ICD-10-CM | POA: Insufficient documentation

## 2020-07-13 DIAGNOSIS — Z01812 Encounter for preprocedural laboratory examination: Secondary | ICD-10-CM | POA: Insufficient documentation

## 2020-07-13 HISTORY — DX: Malignant (primary) neoplasm, unspecified: C80.1

## 2020-07-13 HISTORY — DX: Cardiac arrhythmia, unspecified: I49.9

## 2020-07-13 LAB — CBC
HCT: 45.6 % (ref 39.0–52.0)
Hemoglobin: 14.7 g/dL (ref 13.0–17.0)
MCH: 30.3 pg (ref 26.0–34.0)
MCHC: 32.2 g/dL (ref 30.0–36.0)
MCV: 94 fL (ref 80.0–100.0)
Platelets: 236 10*3/uL (ref 150–400)
RBC: 4.85 MIL/uL (ref 4.22–5.81)
RDW: 16.4 % — ABNORMAL HIGH (ref 11.5–15.5)
WBC: 8.2 10*3/uL (ref 4.0–10.5)
nRBC: 0 % (ref 0.0–0.2)

## 2020-07-13 LAB — BASIC METABOLIC PANEL
Anion gap: 10 (ref 5–15)
BUN: 14 mg/dL (ref 8–23)
CO2: 25 mmol/L (ref 22–32)
Calcium: 8.8 mg/dL — ABNORMAL LOW (ref 8.9–10.3)
Chloride: 105 mmol/L (ref 98–111)
Creatinine, Ser: 1.09 mg/dL (ref 0.61–1.24)
GFR, Estimated: >60 mL/min (ref >60)
Glucose, Bld: 98 mg/dL (ref 70–99)
Potassium: 4 mmol/L (ref 3.5–5.1)
Sodium: 140 mmol/L (ref 135–145)

## 2020-07-13 LAB — SARS CORONAVIRUS 2 (TAT 6-24 HRS): SARS Coronavirus 2: NEGATIVE

## 2020-07-13 NOTE — Progress Notes (Signed)
COVID Vaccine Completed: Yes Date COVID Vaccine completed: 06/18/20. [place] COVID vaccine manufacturer:Pfizer    Moderna     PCP - Chesley Noon: DO. Cardiologist - Dr. Leretha Pol.: Clearance: Tiffany Plunk.: PA.  Chest x-ray -  EKG -  Stress T12/15/21. Request ed  ECHO -  Cardiac Cath -  Pacemaker/ICD device last checked:  Sleep Study -  CPAP -   Fasting Blood Sugar -  Checks Blood Sugar _____ times a day  Blood Thinner Instructions: Eliquis on hold since 07/12/20 as per cardiologist instructions. Aspirin Instructions: Last Dose:  Anesthesia review: Hx: HTN,CAD,Afib.  Patient denies shortness of breath, fever, cough and chest pain at PAT appointment   Patient verbalized understanding of instructions that were given to them at the PAT appointment. Patient was also instructed that they will need to review over the PAT instructions again at home before surgery.

## 2020-07-13 NOTE — Progress Notes (Signed)
Anesthesia Chart Review   Case: [id number] Date/Time: 07/14/20 1000   Procedure: TRANSURETHRAL RESECTION OF BLADDER TUMOR (TURBT) WITH CYSTOSCOPY/ GEMCITABINE INSTILLATION (N/A )   Anesthesia type: General   Pre-op diagnosis: BLADDER CANCER   Location: [facility]   Surgeons: Ceasar Mons, MD      DISCUSSION:69 y.o. former smoker with h/o HTN, PAF (on Eliquis), CAD, bladder cancer scheduled for above procedure 07/14/20 with Dr. Harrell Gave [name].   Seen by cardiology 07/07/20 for preoperative evaluation.  Per OV note, "He is at low risk for the planned urologic bladder procedure. His estimated risk for major adverse cardiac events is somewhere between 0.06% and 0.9% this is derived from my clinical evaluation in addition to using standard surgical risk calculator tools. With regards to the Eliquis, I still see Holter 48 hours before the procedure. The manufacturer's recommendation is to hold the Eliquis for 24 hours there is a low risk of bleeding/hemorrhage. It should be discontinued for 48 hours before elective surgery or invasive procedures with a moderate or high risk of unacceptable or clinically significant bleeding I would recommend sticking with the 48 hours before surgery and restarting it as soon as feasible from your standpoint after surgery(48 hrs) please give me a call if you have any questions."  Anticipate pt can proceed with planned procedure barring acute status change.   VS: There were no vitals taken for this visit.  PROVIDERS: Minna Antis, DO is PCP    LABS: Labs reviewed: Acceptable for surgery. (all labs ordered are listed, but only abnormal results are displayed)  Labs Reviewed - No data to display   IMAGES:   EKG: requested  CV: Echo 11/04/19 Summary   1. Overall left ventricular ejection fraction is estimated at 60 to 65%.   2. Normal global left ventricular systolic function.   3. Normal right ventricular size and systolic  function.   4. Moderately dilated left atrium.   5. Moderately dilated right atrium.   6. Mild mitral valve regurgitation.  Past Medical History:  Diagnosis Date  . Anticoagulant long-term use    eliquis--- managed by cardiology  . Bladder tumor   . Cancer ([place])    bladder  . Cardiomyopathy [facility])    dx 2009 per cardiac cath w/ ef 20-25%;   per cardiology note   . Coronary artery disease cardiologist--- dr Ronnell Freshwater  ([facility] in [place] )   per cardiologist note dated 09-16-2019 , received via fax,  pt had cardiac cath 2009 done at [facility] that showed CTO of LAD with excellent collateralization from LCx and RCA, no PCI performed, sig. cardiomyopathy ef 20-25%;  per note stated improved to 45-50% (no documentation available in note and no echo)  . Dysrhythmia    Afib  . Hematuria   . Hyperlipidemia   . Hypertension    followed by pcp  . Nocturia   . PAF (paroxysmal atrial fibrillation) [facility])    cardiologist--- dr Ronnell Freshwater--- dx 2009    Past Surgical History:  Procedure Laterality Date  . CARDIAC CATHETERIZATION  2009   @[place]  [facility]   per pt done due to dx AFib;  told no blockage's  . CATARACT EXTRACTION W/ INTRAOCULAR LENS  IMPLANT, BILATERAL  02/2019  . TRANSURETHRAL RESECTION OF BLADDER TUMOR N/A 10/29/2019   Procedure: TRANSURETHRAL RESECTION OF BLADDER TUMOR  (TURBT), CYSTOSCOPY/ INTRAVESICAL INSTILLATION OF GEMCITABINE;  Surgeon: Ceasar Mons, MD;  Location[facility];  Service:  Urology;  Laterality: N/A;    MEDICATIONS: No current facility-administered medications for this encounter.   Marland Kitchen apixaban (ELIQUIS) 5 MG TABS tablet  . atorvastatin (LIPITOR) 10 MG tablet  . diltiazem (DILACOR XR) 240 MG 24 hr capsule  . enalapril (VASOTEC) 20 MG tablet  . hydrALAZINE (APRESOLINE) 25 MG tablet  . metoprolol tartrate (LOPRESSOR) 50 MG tablet   . gemcitabine (GEMZAR) chemo syringe for  bladder instillation 2,000 mg    Zachary Felix, PA-C [facility] Pre-Surgical Testing [phone]

## 2020-07-13 NOTE — Anesthesia Preprocedure Evaluation (Addendum)
Anesthesia Evaluation  Patient identified by MRN, date of birth, ID band Patient awake    Reviewed: Allergy & Precautions, NPO status , Patient's Chart, lab work & pertinent test results, reviewed documented beta blocker date and time   History of Anesthesia Complications Negative for: history of anesthetic complications  Airway Mallampati: II  TM Distance: >3 FB Neck ROM: Full    Dental   Pulmonary neg pulmonary ROS, former smoker,    Pulmonary exam normal        Cardiovascular hypertension, Pt. on medications and Pt. on home beta blockers + CAD and +CHF  Normal cardiovascular exam+ dysrhythmias Atrial Fibrillation      Neuro/Psych negative neurological ROS  negative psych ROS   GI/Hepatic negative GI ROS, Neg liver ROS,   Endo/Other  negative endocrine ROS  Renal/GU negative Renal ROS   Bladder tumor    Musculoskeletal negative musculoskeletal ROS (+)   Abdominal   Peds  Hematology Eliquis   Anesthesia Other Findings  Echo 11/04/19: EF 60-65%, mild MR  Seen by cardiology 07/07/20 for preoperative evaluation.  Per OV note, "He is at low risk for the planned urologic bladder procedure. His estimated risk for major adverse cardiac events is somewhere between 0.06% and 0.9% this is derived from my clinical evaluation in addition to using standard surgical risk calculator tools. With regards to the Eliquis, I still see Holter 48 hours before the procedure. The manufacturer's recommendation is to hold the Eliquis for 24 hours there is a low risk of bleeding/hemorrhage. It should be discontinued for 48 hours before elective surgery or invasive procedures with a moderate or high risk of unacceptable or clinically significant bleeding I would recommend sticking with the 48 hours before surgery and restarting it as soon as feasible from your standpoint after surgery(48 hrs) please give me a call if you have any questions."   Reproductive/Obstetrics                            Anesthesia Physical Anesthesia Plan  ASA: III  Anesthesia Plan: General   Post-op Pain Management:    Induction: Intravenous  PONV Risk Score and Plan: 2 and Ondansetron, Dexamethasone, Midazolam and Treatment may vary due to age or medical condition  Airway Management Planned: LMA  Additional Equipment: None  Intra-op Plan:   Post-operative Plan: Extubation in OR  Informed Consent: I have reviewed the patients History and Physical, chart, labs and discussed the procedure including the risks, benefits and alternatives for the proposed anesthesia with the patient or authorized representative who has indicated his/her understanding and acceptance.     Dental advisory given  Plan Discussed with:   Anesthesia Plan Comments: (See PAT note 07/13/20, Konrad Felix, PA-C)       Anesthesia Quick Evaluation

## 2020-07-14 ENCOUNTER — Ambulatory Visit (HOSPITAL_COMMUNITY): Payer: Medicare Other | Admitting: Physician Assistant

## 2020-07-14 ENCOUNTER — Ambulatory Visit (HOSPITAL_COMMUNITY)
Admission: RE | Admit: 2020-07-14 | Discharge: 2020-07-14 | Disposition: A | Discharge status: Home / Self Care | Payer: Medicare Other | Attending: Urology | Admitting: Urology

## 2020-07-14 ENCOUNTER — Encounter (HOSPITAL_COMMUNITY): Payer: Self-pay | Admitting: Urology

## 2020-07-14 ENCOUNTER — Encounter (HOSPITAL_COMMUNITY)
Admission: RE | Disposition: A | Discharge status: Home / Self Care | Payer: Self-pay | Source: Home / Self Care | Attending: Urology

## 2020-07-14 DIAGNOSIS — I10 Essential (primary) hypertension: Secondary | ICD-10-CM | POA: Diagnosis not present

## 2020-07-14 DIAGNOSIS — I480 Paroxysmal atrial fibrillation: Secondary | ICD-10-CM | POA: Insufficient documentation

## 2020-07-14 DIAGNOSIS — Z86718 Personal history of other venous thrombosis and embolism: Secondary | ICD-10-CM | POA: Diagnosis not present

## 2020-07-14 DIAGNOSIS — I2510 Atherosclerotic heart disease of native coronary artery without angina pectoris: Secondary | ICD-10-CM | POA: Diagnosis not present

## 2020-07-14 DIAGNOSIS — E785 Hyperlipidemia, unspecified: Secondary | ICD-10-CM | POA: Insufficient documentation

## 2020-07-14 DIAGNOSIS — D494 Neoplasm of unspecified behavior of bladder: Secondary | ICD-10-CM | POA: Diagnosis present

## 2020-07-14 DIAGNOSIS — Z79899 Other long term (current) drug therapy: Secondary | ICD-10-CM | POA: Diagnosis not present

## 2020-07-14 DIAGNOSIS — Z7901 Long term (current) use of anticoagulants: Secondary | ICD-10-CM | POA: Insufficient documentation

## 2020-07-14 DIAGNOSIS — Z87891 Personal history of nicotine dependence: Secondary | ICD-10-CM | POA: Diagnosis not present

## 2020-07-14 DIAGNOSIS — Z7982 Long term (current) use of aspirin: Secondary | ICD-10-CM | POA: Diagnosis not present

## 2020-07-14 DIAGNOSIS — C674 Malignant neoplasm of posterior wall of bladder: Secondary | ICD-10-CM | POA: Insufficient documentation

## 2020-07-14 HISTORY — PX: TRANSURETHRAL RESECTION OF BLADDER TUMOR: SHX2575

## 2020-07-14 SURGERY — TURBT (TRANSURETHRAL RESECTION OF BLADDER TUMOR)
Anesthesia: General

## 2020-07-14 MED ORDER — ONDANSETRON HCL 4 MG/2ML IJ SOLN
INTRAMUSCULAR | Status: DC | PRN
  Administered 2020-07-14: 4 mg via INTRAVENOUS

## 2020-07-14 MED ORDER — PHENYLEPHRINE 40 MCG/ML (10ML) SYRINGE FOR IV PUSH (FOR BLOOD PRESSURE SUPPORT)
PREFILLED_SYRINGE | INTRAVENOUS | Status: AC
  Filled 2020-07-14: qty 10

## 2020-07-14 MED ORDER — SODIUM CHLORIDE 0.9 % IR SOLN
Status: DC | PRN
  Administered 2020-07-14: 3000 mL

## 2020-07-14 MED ORDER — DEXAMETHASONE SODIUM PHOSPHATE 10 MG/ML IJ SOLN
INTRAMUSCULAR | Status: AC
Start: 2020-07-14 — End: ?
  Filled 2020-07-14: qty 1

## 2020-07-14 MED ORDER — 0.9 % SODIUM CHLORIDE (POUR BTL) OPTIME
TOPICAL | Status: DC | PRN
  Administered 2020-07-14: 10:00:00 1000 mL

## 2020-07-14 MED ORDER — DEXAMETHASONE SODIUM PHOSPHATE 10 MG/ML IJ SOLN
INTRAMUSCULAR | Status: DC | PRN
  Administered 2020-07-14: 8 mg via INTRAVENOUS

## 2020-07-14 MED ORDER — FENTANYL CITRATE (PF) 100 MCG/2ML IJ SOLN
INTRAMUSCULAR | Status: DC | PRN
  Administered 2020-07-14 (×2): 50 ug via INTRAVENOUS

## 2020-07-14 MED ORDER — MIDAZOLAM HCL 5 MG/5ML IJ SOLN
INTRAMUSCULAR | Status: DC | PRN
  Administered 2020-07-14: 1 mg via INTRAVENOUS

## 2020-07-14 MED ORDER — LIDOCAINE 2% (20 MG/ML) 5 ML SYRINGE
INTRAMUSCULAR | Status: DC | PRN
  Administered 2020-07-14: 80 mg via INTRAVENOUS

## 2020-07-14 MED ORDER — SUGAMMADEX SODIUM 200 MG/2ML IV SOLN
INTRAVENOUS | Status: DC | PRN
  Administered 2020-07-14: 200 mg via INTRAVENOUS

## 2020-07-14 MED ORDER — CHLORHEXIDINE GLUCONATE 0.12 % MT SOLN
15.0000 mL | Freq: Once | OROMUCOSAL | Status: AC
  Administered 2020-07-14: 08:00:00 15 mL via OROMUCOSAL

## 2020-07-14 MED ORDER — MIDAZOLAM HCL 2 MG/2ML IJ SOLN
INTRAMUSCULAR | Status: AC
  Filled 2020-07-14: qty 2

## 2020-07-14 MED ORDER — LIDOCAINE HCL (PF) 2 % IJ SOLN
INTRAMUSCULAR | Status: AC
  Filled 2020-07-14: qty 5

## 2020-07-14 MED ORDER — PHENYLEPHRINE 40 MCG/ML (10ML) SYRINGE FOR IV PUSH (FOR BLOOD PRESSURE SUPPORT)
PREFILLED_SYRINGE | INTRAVENOUS | Status: DC | PRN
  Administered 2020-07-14: 80 ug via INTRAVENOUS

## 2020-07-14 MED ORDER — FENTANYL CITRATE (PF) 100 MCG/2ML IJ SOLN
INTRAMUSCULAR | Status: AC
  Filled 2020-07-14: qty 2

## 2020-07-14 MED ORDER — PROPOFOL 10 MG/ML IV BOLUS
INTRAVENOUS | Status: DC | PRN
  Administered 2020-07-14: 120 mg via INTRAVENOUS

## 2020-07-14 MED ORDER — ONDANSETRON HCL 4 MG/2ML IJ SOLN
INTRAMUSCULAR | Status: AC
  Filled 2020-07-14: qty 2

## 2020-07-14 MED ORDER — ROCURONIUM BROMIDE 10 MG/ML (PF) SYRINGE
PREFILLED_SYRINGE | INTRAVENOUS | Status: DC | PRN
  Administered 2020-07-14: 60 mg via INTRAVENOUS

## 2020-07-14 MED ORDER — LACTATED RINGERS IV SOLN: INTRAVENOUS | Status: DC

## 2020-07-14 MED ORDER — GEMCITABINE CHEMO FOR BLADDER INSTILLATION 2000 MG
2000.0000 mg | Freq: Once | INTRAVENOUS | Status: AC
  Administered 2020-07-14: 11:00:00 2000 mg via INTRAVESICAL
  Filled 2020-07-14: qty 2000

## 2020-07-14 MED ORDER — CIPROFLOXACIN IN D5W 400 MG/200ML IV SOLN
400.0000 mg | Freq: Once | INTRAVENOUS | Status: AC
  Administered 2020-07-14: 10:00:00 400 mg via INTRAVENOUS
  Filled 2020-07-14: qty 200

## 2020-07-14 MED ORDER — ROCURONIUM BROMIDE 10 MG/ML (PF) SYRINGE
PREFILLED_SYRINGE | INTRAVENOUS | Status: AC
  Filled 2020-07-14: qty 10

## 2020-07-14 MED ORDER — ORAL CARE MOUTH RINSE: 15.0000 mL | Freq: Once | OROMUCOSAL | Status: AC

## 2020-07-14 MED ORDER — PROPOFOL 10 MG/ML IV BOLUS
INTRAVENOUS | Status: AC
Start: 2020-07-14 — End: ?
  Filled 2020-07-14: qty 20

## 2020-07-14 SURGICAL SUPPLY — 17 items
BAG URINE DRAIN 2000ML AR STRL (UROLOGICAL SUPPLIES) IMPLANT
BAG URO CATCHER STRL LF (MISCELLANEOUS) ×3 IMPLANT
CATH FOLEY 2WAY SLVR  5CC 16FR (CATHETERS) ×2
CATH FOLEY 2WAY SLVR 5CC 16FR (CATHETERS) ×1 IMPLANT
ELECT REM PT RETURN 15FT ADLT (MISCELLANEOUS) ×3 IMPLANT
GLOVE SURG ENC TEXT LTX SZ7.5 (GLOVE) ×3 IMPLANT
GOWN STRL REUS W/TWL XL LVL3 (GOWN DISPOSABLE) ×3 IMPLANT
KIT TURNOVER KIT A (KITS) IMPLANT
LOOP CUT BIPOLAR 24F LRG (ELECTROSURGICAL) ×3 IMPLANT
MANIFOLD NEPTUNE II (INSTRUMENTS) ×3 IMPLANT
PACK CYSTO (CUSTOM PROCEDURE TRAY) ×3 IMPLANT
PENCIL SMOKE EVACUATOR (MISCELLANEOUS) IMPLANT
SYR TOOMEY IRRIG 70ML (MISCELLANEOUS) ×3
SYRINGE TOOMEY IRRIG 70ML (MISCELLANEOUS) ×1 IMPLANT
TUBING CONNECTING 10 (TUBING) ×2 IMPLANT
TUBING CONNECTING 10' (TUBING) ×1
TUBING UROLOGY SET (TUBING) ×3 IMPLANT

## 2020-07-14 NOTE — Anesthesia Postprocedure Evaluation (Signed)
Anesthesia Post Note  Patient: Zachary Shannon.  Procedure(s) Performed: TRANSURETHRAL RESECTION OF BLADDER TUMOR (TURBT) WITH CYSTOSCOPY/ GEMCITABINE INSTILLATION (N/A )     Patient location during evaluation: PACU Anesthesia Type: General Level of consciousness: awake and alert Pain management: pain level controlled Vital Signs Assessment: post-procedure vital signs reviewed and stable Respiratory status: spontaneous breathing, nonlabored ventilation and respiratory function stable Cardiovascular status: blood pressure returned to baseline and stable Postop Assessment: no apparent nausea or vomiting Anesthetic complications: no   No complications documented.  Last Vitals:  Vitals:   07/14/20 1200 07/14/20 1215  BP: 114/83 125/88  Pulse: 63 66  Resp: 13 14  Temp:  37 C  SpO2: 93% 92%    Last Pain:  Vitals:   07/14/20 1215  TempSrc:   PainSc: 0-No pain                 Lidia Collum

## 2020-07-14 NOTE — Anesthesia Procedure Notes (Signed)

## 2020-07-14 NOTE — Op Note (Signed)
Operative Note  Preoperative diagnosis:  1.  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  History of high-grade T1 urothelial carcinoma the bladder  Postoperative diagnosis: 1.  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  History of high-grade T1 urothelial carcinoma the bladder  Procedure(s): 1.  TURBT (medium) 2.  Intravesical instillation of gemcitabine  Surgeon: Ellison Hughs, MD  Assistants:  None  Anesthesia:  General  Complications:  None  EBL: 10 mL  Specimens: 1.  Superficial and deep margins of posterior bladder wall tumor  Drains/Catheters: 1.  16 French Foley catheter  Intraoperative findings:   1. 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 No evidence of bladder perforation following resection  Indication:  Zachary Shannon. is a 69 y.o. male with a history of high-grade T1 urothelial carcinoma the bladder.  The patient completed induction BCG in July 2021.  He had a surveillance cystoscopy in November that revealed a 2 cm papillary bladder tumor recurrence involving the posterior bladder wall.  He is here today for TURBT.  He has been consented for the above procedures, voices understanding wishes to proceed.  Description of procedure:  After informed consent was obtained, the patient was brought to the operating room and general LMA anesthesia was administered. The patient was then placed in the dorsolithotomy position and prepped and draped in the usual sterile fashion. A timeout was performed. A 23 French rigid cystoscope was then inserted into the urethral meatus and advanced into the bladder under direct vision. A complete bladder survey revealed the findings listed above.  The rigid cystoscope was exchanged for a 26 French resectoscope with a bipolar loop working element.  The 2 cm papillary bladder tumor was then resected superficially.  The tumor was then evacuated  from the bladder through the sheath of the resectoscope and sent off for permanent section.  I then took deep margins of the tumor, down to the detrusor musculature.  The specimen was also sent off for permanent section.  The area of resection was then extensively fulgurated until hemostasis was achieved.  An 21 French Foley catheter was then inserted in the bladder and irrigated. The irrigant returned slightly pink with no clots. The patient was awakened and taken to the recovery room.  While in the recovery room 2000 mg of gemcitabine in 50 mL of water was instilled in the bladder through the catheter and the catheter was plugged. This will remain indwelling for approximately one hour. It will then be drained from the bladder and the catheter will be removed and the patient discharged home.  Plan: Intravesical gemcitabine will need to stay in place for approximately 1 hour in the recovery room.  His Foley cath will need to be removed prior to discharge.  Follow-up in 2 weeks to discuss pathology results.

## 2020-07-14 NOTE — H&P (Signed)
PRE-OP H&P  Office Visit Report     06/14/2020   --------------------------------------------------------------------------------   Zachary Shannon  [id number]  DOB: August 20, 1950, 69 year old Male   PRIMARY CARE:    REFERRING:  Ardis Hughs, MD  PROVIDER:  Ellison Hughs, M.D.  LOCATION:  [facility] 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 PMhx of A-fib (on eliquis and aspirin), HTN and HLD.   Last PSA- 0.54 (08/2019)--No personal/family history of prostate cancer.  Last BCG treatment: 01/29/20   11/13/19: Zachary Shannon is here for a routine post-op visit following his TURBT. He reports that he was diagnosed with a LLE DVT soon after his surgery and was started on Eliquis. He report an episode of gross hematuria with clots soon after starting Eliquis and reports that it has since subsided. UA today shows a large amount of blood in his urine specimen with few bacteria. He denies dysuria or urgency and feels like he is emptying his bladder well.   06/14/20: The patient is here today for a routine follow-up and surveillance cystoscopy after completing induction BCG. He reports 1 episode of gross hematuria during BCG treatments, but states that he tolerated treatment well overall. From a urinary standpoint, he reports a good force of stream and feels like he is emptying his bladder well. He denies interval UTIs or dysuria. He continues to take Eliquis due to his history of a LLE DVT.     ALLERGIES: No Allergies    MEDICATIONS: Metoprolol Succinate 25 mg tablet, extended release 24 hr  Atorvastatin Calcium 10 mg tablet  Eliquis 5 mg tablet  Enalapril Maleate 20 mg tablet  Hydralazine Hcl 25 mg tablet      GU PSH: Bladder Instill AntiCA Agent - 01/29/2020, 01/22/2020, 01/15/2020, 01/08/2020, 12/25/2019, 12/18/2019, 10/29/2019 Cystoscopy TURBT >5 cm - 10/29/2019 Locm 300-399Mg /Ml Iodine,1Ml - 09/22/2019       PSH Notes: spinal cyst removed    NON-GU PSH: Cataract surgery, Bilateral     GU PMH: Bladder Cancer overlapping sites - 11/13/2019 Gross hematuria - 09/10/2019    NON-GU PMH: Encounter for antineoplastic chemotherapy - 12/18/2019 Atrial Fibrillation Hypertension Phlebitis and thrombophlebitis of unspecified deep vessels of unspecified lower extremity    FAMILY HISTORY: 4 daughters - No Family History Breast Cancer - Daughter colonic polyps - Daughter DVT prophylaxis - Mother Lung Cancer - Father   SOCIAL HISTORY: Marital Status: Divorced Preferred Language: English; Race: White Current Smoking Status: Patient does not smoke anymore.   Tobacco Use Assessment Completed: Used Tobacco in last 30 days? Has never drank.  Does not drink caffeine.    REVIEW OF SYSTEMS:    GU Review Male:   Patient denies frequent urination, hard to postpone urination, burning/ pain with urination, get up at night to urinate, leakage of urine, stream starts and stops, trouble starting your stream, have to strain to urinate , erection problems, and penile pain.  Gastrointestinal (Upper):   Patient denies nausea, vomiting, and indigestion/ heartburn.  Gastrointestinal (Lower):   Patient denies diarrhea and constipation.  Constitutional:   Patient denies fever, night sweats, weight loss, and fatigue.  Skin:   Patient denies skin rash/ lesion and itching.  Eyes:   Patient denies blurred vision and double vision.  Ears/ Nose/ Throat:  Patient denies sore throat and sinus problems.  Hematologic/Lymphatic:   Patient denies swollen glands and easy bruising.  Cardiovascular:   Patient denies leg swelling and chest pains.  Respiratory:   Patient denies cough and shortness of breath.  Endocrine:   Patient denies  excessive thirst.  Musculoskeletal:   Patient denies back pain and joint pain.  Neurological:   Patient denies headaches and dizziness.  Psychologic:   Patient denies depression and anxiety.   VITAL SIGNS:      06/14/2020 12:21 PM  Weight 255 lb / 115.67 kg  BP 132/78 mmHg  Pulse 78 /min  Temperature 97.5 F / 36.3 C   GU PHYSICAL EXAMINATION:    Urethral Meatus: Normal size. No lesion, no wart, no discharge, no polyp. Normal location.  Penis: Circumcised, no warts, no cracks. No dorsal Peyronie's plaques, no left corporal Peyronie's plaques, no right corporal Peyronie's plaques, no scarring, no warts. No balanitis, no meatal stenosis.   MULTI-SYSTEM PHYSICAL EXAMINATION:    Constitutional: Well-nourished. No physical deformities. Normally developed. Good grooming.  Neurologic / Psychiatric: Oriented to time, oriented to place, oriented to person. No depression, no anxiety, no agitation.  Gastrointestinal: Obese abdomen. No mass, no tenderness, no rigidity.      Complexity of Data:  Records Review:   Pathology Reports, Previous Patient Records   09/12/19  PSA  Total PSA 0.54 ng/mL   Notes:                     The   PROCEDURES:         Flexible Cystoscopy - 52000  Risks, benefits, and some of the potential complications of the procedure were discussed at length with the patient including infection, bleeding, voiding discomfort, urinary retention, fever, chills, sepsis, and others. All questions were answered. Informed consent was obtained. Antibiotic prophylaxis was given. Sterile technique and intraurethral analgesia were used.  Meatus:  Normal size. Normal location. Normal condition.  Urethra:  No strictures.  External Sphincter:  Normal.  Verumontanum:  Normal.  Prostate:  Non-obstructing. No hyperplasia.  Bladder Neck:  Non-obstructing.  Ureteral Orifices:  Normal location. Normal size. Normal shape. Effluxed clear urine.  Bladder:  A posterior wall tumor. 2 cm tumor. No  trabeculation. Normal mucosa. No stones.      The lower urinary tract was carefully examined. The procedure was well-tolerated and without complications. Antibiotic instructions were given. Instructions were given to call the office immediately for bloody urine, difficulty urinating, urinary retention, painful or frequent urination, fever, chills, nausea, vomiting or other illness. The patient stated that he understood these instructions and would comply with them.         Urinalysis w/Scope Dipstick Dipstick Cont'd Micro  Color: Amber Bilirubin: Neg mg/dL WBC/hpf: 0 - 5/hpf  Appearance: Cloudy Ketones: Neg mg/dL RBC/hpf: 40 - 60/hpf  Specific Gravity: 1.025 Blood: 1+ ery/uL Bacteria: Rare (0-9/hpf)  pH: 5.5 Protein: Trace mg/dL Cystals: NS (Not Seen)  Glucose: Neg mg/dL Urobilinogen: 0.2 mg/dL Casts: NS (Not Seen)    Nitrites: Neg Trichomonas: Not Present    Leukocyte Esterase: Neg leu/uL Mucous: Present      Epithelial Cells: 0 - 5/hpf      Yeast: NS (Not Seen)      Sperm: Not Present    ASSESSMENT:      ICD-10 Details  1 GU:   Bladder Cancer overlapping sites - C67.8 Chronic, Worsening   PLAN:  Medications New Meds: Bactrim Ds 800 mg-160 mg tablet 1 tablet PO BID   #14  0 Refill(s)            Orders Labs Urine Culture          Schedule Return Visit/Planned Activity: Next Available Appointment - Schedule Surgery          Document Letter(s):  Created for Patient: Clinical Summary         Notes:   -Cystoscopy revealed a 2 cm bladder tumor with features concerning for urothelial carcinoma.  -The risks, benefits and alternatives of cystoscopy with TURBT and gemcitabine instillation was discussed with the patient. The risks include, but are not limited to, bleeding, urinary tract infection, bladder perforation requiring prolonged catheterization and/or open bladder repair, ureteral obstruction, voiding dysfunction and the inherent risks of general anesthesia. The  patient voices understanding and wishes to proceed.

## 2020-07-14 NOTE — Transfer of Care (Signed)
Immediate Anesthesia Transfer of Care Note  Patient: Zachary Shannon.  Procedure(s) Performed: TRANSURETHRAL RESECTION OF BLADDER TUMOR (TURBT) WITH CYSTOSCOPY/ GEMCITABINE INSTILLATION (N/A )  Patient Location: PACU  Anesthesia Type:General  Level of Consciousness: awake, alert , oriented and patient cooperative  Airway & Oxygen Therapy: Patient Spontanous Breathing and Patient connected to face mask oxygen  Post-op Assessment: Report given to RN, Post -op Vital signs reviewed and stable and Patient moving all extremities  Post vital signs: Reviewed and stable  Last Vitals:  Vitals Value Taken Time  BP 123/86 07/14/20 1055  Temp    Pulse 57 07/14/20 1100  Resp 13 07/14/20 1100  SpO2 100 % 07/14/20 1100  Vitals shown include unvalidated device data.  Last Pain:  Vitals:   07/14/20 0825  TempSrc: Oral  PainSc:          Complications: No complications documented.

## 2020-07-15 ENCOUNTER — Encounter (HOSPITAL_COMMUNITY): Payer: Self-pay | Admitting: Urology

## 2020-07-15 LAB — SURGICAL PATHOLOGY

## 2020-09-10 ENCOUNTER — Other Ambulatory Visit: Payer: Self-pay | Admitting: Urology

## 2020-10-26 ENCOUNTER — Other Ambulatory Visit: Payer: Self-pay | Admitting: Urology

## 2020-10-26 ENCOUNTER — Inpatient Hospital Stay (HOSPITAL_COMMUNITY)
Admission: RE | Admit: 2020-10-26 | Discharge: 2020-11-02 | DRG: 654 | Disposition: A | Discharge status: Home Health | Payer: Medicare Other | Source: Ambulatory Visit | Attending: Urology | Admitting: Urology

## 2020-10-26 ENCOUNTER — Encounter (HOSPITAL_COMMUNITY): Payer: Self-pay | Admitting: Urology

## 2020-10-26 ENCOUNTER — Other Ambulatory Visit: Payer: Self-pay

## 2020-10-26 DIAGNOSIS — Z86718 Personal history of other venous thrombosis and embolism: Secondary | ICD-10-CM | POA: Diagnosis not present

## 2020-10-26 DIAGNOSIS — L03319 Cellulitis of trunk, unspecified: Secondary | ICD-10-CM | POA: Diagnosis not present

## 2020-10-26 DIAGNOSIS — Z6836 Body mass index (BMI) 36.0-36.9, adult: Secondary | ICD-10-CM | POA: Diagnosis not present

## 2020-10-26 DIAGNOSIS — K567 Ileus, unspecified: Secondary | ICD-10-CM | POA: Diagnosis not present

## 2020-10-26 DIAGNOSIS — E669 Obesity, unspecified: Secondary | ICD-10-CM | POA: Diagnosis present

## 2020-10-26 DIAGNOSIS — I10 Essential (primary) hypertension: Secondary | ICD-10-CM | POA: Diagnosis present

## 2020-10-26 DIAGNOSIS — Z79899 Other long term (current) drug therapy: Secondary | ICD-10-CM | POA: Diagnosis not present

## 2020-10-26 DIAGNOSIS — C61 Malignant neoplasm of prostate: Secondary | ICD-10-CM | POA: Diagnosis present

## 2020-10-26 DIAGNOSIS — Z961 Presence of intraocular lens: Secondary | ICD-10-CM | POA: Diagnosis present

## 2020-10-26 DIAGNOSIS — C679 Malignant neoplasm of bladder, unspecified: Secondary | ICD-10-CM | POA: Diagnosis present

## 2020-10-26 DIAGNOSIS — M1610 Unilateral primary osteoarthritis, unspecified hip: Secondary | ICD-10-CM | POA: Diagnosis present

## 2020-10-26 DIAGNOSIS — I2510 Atherosclerotic heart disease of native coronary artery without angina pectoris: Secondary | ICD-10-CM | POA: Diagnosis present

## 2020-10-26 DIAGNOSIS — Z20822 Contact with and (suspected) exposure to covid-19: Secondary | ICD-10-CM | POA: Diagnosis present

## 2020-10-26 DIAGNOSIS — K4020 Bilateral inguinal hernia, without obstruction or gangrene, not specified as recurrent: Secondary | ICD-10-CM | POA: Diagnosis present

## 2020-10-26 DIAGNOSIS — Z9842 Cataract extraction status, left eye: Secondary | ICD-10-CM | POA: Diagnosis not present

## 2020-10-26 DIAGNOSIS — I480 Paroxysmal atrial fibrillation: Secondary | ICD-10-CM | POA: Diagnosis present

## 2020-10-26 DIAGNOSIS — R110 Nausea: Secondary | ICD-10-CM | POA: Diagnosis not present

## 2020-10-26 DIAGNOSIS — E785 Hyperlipidemia, unspecified: Secondary | ICD-10-CM | POA: Diagnosis present

## 2020-10-26 DIAGNOSIS — Z7901 Long term (current) use of anticoagulants: Secondary | ICD-10-CM | POA: Diagnosis not present

## 2020-10-26 DIAGNOSIS — Z9841 Cataract extraction status, right eye: Secondary | ICD-10-CM | POA: Diagnosis not present

## 2020-10-26 DIAGNOSIS — Z87891 Personal history of nicotine dependence: Secondary | ICD-10-CM

## 2020-10-26 DIAGNOSIS — N179 Acute kidney failure, unspecified: Secondary | ICD-10-CM | POA: Diagnosis not present

## 2020-10-26 LAB — TYPE AND SCREEN
ABO/RH(D): A POS
Antibody Screen: NEGATIVE

## 2020-10-26 LAB — COMPREHENSIVE METABOLIC PANEL
ALT: 20 U/L (ref 0–44)
AST: 25 U/L (ref 15–41)
Albumin: 3.9 g/dL (ref 3.5–5.0)
Alkaline Phosphatase: 105 U/L (ref 38–126)
Anion gap: 9 (ref 5–15)
BUN: 19 mg/dL (ref 8–23)
CO2: 24 mmol/L (ref 22–32)
Calcium: 9.1 mg/dL (ref 8.9–10.3)
Chloride: 106 mmol/L (ref 98–111)
Creatinine, Ser: 1.13 mg/dL (ref 0.61–1.24)
GFR, Estimated: >60 mL/min (ref >60)
Glucose, Bld: 106 mg/dL — ABNORMAL HIGH (ref 70–99)
Potassium: 4.4 mmol/L (ref 3.5–5.1)
Sodium: 139 mmol/L (ref 135–145)
Total Bilirubin: 1.4 mg/dL — ABNORMAL HIGH (ref 0.3–1.2)
Total Protein: 7.6 g/dL (ref 6.5–8.1)

## 2020-10-26 LAB — CBC WITH DIFFERENTIAL/PLATELET
Abs Immature Granulocytes: 0.14 10*3/uL — ABNORMAL HIGH (ref 0.00–0.07)
Basophils Absolute: 0.1 10*3/uL (ref 0.0–0.1)
Basophils Relative: 1 %
Eosinophils Absolute: 0.1 10*3/uL (ref 0.0–0.5)
Eosinophils Relative: 1 %
HCT: 49.7 % (ref 39.0–52.0)
Hemoglobin: 15.8 g/dL (ref 13.0–17.0)
Immature Granulocytes: 1 %
Lymphocytes Relative: 13 %
Lymphs Abs: 1.3 10*3/uL (ref 0.7–4.0)
MCH: 32.3 pg (ref 26.0–34.0)
MCHC: 31.8 g/dL (ref 30.0–36.0)
MCV: 101.6 fL — ABNORMAL HIGH (ref 80.0–100.0)
Monocytes Absolute: 0.9 10*3/uL (ref 0.1–1.0)
Monocytes Relative: 9 %
Neutro Abs: 7.5 10*3/uL (ref 1.7–7.7)
Neutrophils Relative %: 75 %
Platelets: 256 10*3/uL (ref 150–400)
RBC: 4.89 MIL/uL (ref 4.22–5.81)
RDW: 14.3 % (ref 11.5–15.5)
WBC: 10 10*3/uL (ref 4.0–10.5)
nRBC: 0 % (ref 0.0–0.2)

## 2020-10-26 LAB — SARS CORONAVIRUS 2 (TAT 6-24 HRS): SARS Coronavirus 2: NEGATIVE

## 2020-10-26 LAB — HIV ANTIBODY (ROUTINE TESTING W REFLEX): HIV Screen 4th Generation wRfx: NONREACTIVE

## 2020-10-26 MED ORDER — METRONIDAZOLE 500 MG PO TABS
500.0000 mg | ORAL_TABLET | ORAL | Status: AC
  Administered 2020-10-26 (×2): 500 mg via ORAL
  Filled 2020-10-26 (×2): qty 1

## 2020-10-26 MED ORDER — ATORVASTATIN CALCIUM 10 MG PO TABS
10.0000 mg | ORAL_TABLET | Freq: Every day | ORAL | Status: DC
  Administered 2020-10-26 – 2020-11-01 (×7): 10 mg via ORAL
  Filled 2020-10-26 (×7): qty 1

## 2020-10-26 MED ORDER — METOPROLOL TARTRATE 25 MG PO TABS
25.0000 mg | ORAL_TABLET | Freq: Two times a day (BID) | ORAL | Status: DC
  Administered 2020-10-26 – 2020-11-02 (×13): 25 mg via ORAL
  Filled 2020-10-26 (×13): qty 1

## 2020-10-26 MED ORDER — PEG 3350-KCL-NA BICARB-NACL 420 G PO SOLR
4000.0000 mL | Freq: Once | ORAL | Status: AC
  Administered 2020-10-26: 4000 mL via ORAL

## 2020-10-26 MED ORDER — DILTIAZEM HCL ER COATED BEADS 240 MG PO CP24
240.0000 mg | ORAL_CAPSULE | Freq: Every day | ORAL | Status: DC
  Administered 2020-10-28 – 2020-11-02 (×6): 240 mg via ORAL
  Filled 2020-10-26 (×6): qty 1

## 2020-10-26 MED ORDER — NEOMYCIN SULFATE 500 MG PO TABS
1000.0000 mg | ORAL_TABLET | ORAL | Status: AC
  Administered 2020-10-26 (×2): 1000 mg via ORAL
  Filled 2020-10-26 (×2): qty 2

## 2020-10-26 MED ORDER — ALVIMOPAN 12 MG PO CAPS
12.0000 mg | ORAL_CAPSULE | ORAL | Status: AC
  Administered 2020-10-27: 12 mg via ORAL
  Filled 2020-10-26: qty 1

## 2020-10-26 MED ORDER — SODIUM CHLORIDE 0.9 % IV SOLN: INTRAVENOUS | Status: DC

## 2020-10-26 NOTE — H&P (Signed)
Zachary Klinefelter Joangel Vanosdol. is an 70 y.o. male.    Chief Complaint: Pre-Op Cystoprostatectomy  HPI:   1 - Recurrent BCG-Refractory High Grade Bladder Cancer - T1G3 by TURBT 10/2019, induction BCG, and recurrence T1G3 06/2020. Staging CT 09/2019 localized, single ureters bilaterally.   2 - Prostate Screening - 2021 - PSA 0.54   3 - Bilateral Inguinal Hernias - R>L incidental hernias on CT 09/2019, right with some small bowel. NO h/o strangulation.   PMH sig for AFib/DVT/ELiquus (follows [place] [facility] with [facility], has clearnce to stop eliqus 48 hours prior surgery), morbid obesity, hip OA (possible replacement pending). He is retired from [employer], then [employer] in [place]. His daughter Zachary Shannon is very involved and his POA. His PCP is Chesley Noon DOwith [facility] in [place].   Today "Zachary Shannon" is seen as pre-op admission for stomal marking, bowel prep, prior to planned cystectomy / urinary diversion tomorrow.     Past Medical History:  Diagnosis Date  . Anticoagulant long-term use    eliquis--- managed by cardiology  . Bladder tumor   . Cancer ([place])    bladder  . Cardiomyopathy [facility])    dx 2009 per cardiac cath w/ ef 20-25%;   per cardiology note   . Coronary artery disease cardiologist--- dr Ronnell Freshwater  ([facility] in [place] )   per cardiologist note dated 09-16-2019 , received via fax,  pt had cardiac cath 2009 done at [facility] that showed CTO of LAD with excellent collateralization from LCx and RCA, no PCI performed, sig. cardiomyopathy ef 20-25%;  per note stated improved to 45-50% (no documentation available in note and no echo)  . Dysrhythmia    Afib  . Hematuria   . Hyperlipidemia   . Hypertension    followed by pcp  . Nocturia   . PAF (paroxysmal atrial fibrillation) [facility])    cardiologist--- dr Ronnell Freshwater--- dx 2009    Past Surgical History:  Procedure Laterality Date  . CARDIAC CATHETERIZATION   2009   @[email or web address]   per pt done due to dx AFib;  told no blockage's  . CATARACT EXTRACTION W/ INTRAOCULAR LENS  IMPLANT, BILATERAL  02/2019  . TRANSURETHRAL RESECTION OF BLADDER TUMOR N/A 10/29/2019   Procedure: TRANSURETHRAL RESECTION OF BLADDER TUMOR  (TURBT), CYSTOSCOPY/ INTRAVESICAL INSTILLATION OF GEMCITABINE;  Surgeon: Ceasar Mons, MD;  Location: [facility];  Service: Urology;  Laterality: N/A;  . TRANSURETHRAL RESECTION OF BLADDER TUMOR N/A 07/14/2020   Procedure: TRANSURETHRAL RESECTION OF BLADDER TUMOR (TURBT) WITH CYSTOSCOPY/ GEMCITABINE INSTILLATION;  Surgeon: Ceasar Mons, MD;  Location: [facility];  Service: Urology;  Laterality: N/A;    History reviewed. No pertinent family history. Social History:  reports that he quit smoking about 12 years ago. His smoking use included cigarettes. He has a 14.00 pack-year smoking history. He quit smokeless tobacco use about 12 years ago. He reports that he does not drink alcohol and does not use drugs.  Allergies: No Known Allergies  Medications Prior to Admission  Medication Sig Dispense Refill  . apixaban (ELIQUIS) 5 MG TABS tablet Take 5 mg by mouth 2 (two) times daily.    Marland Kitchen atorvastatin (LIPITOR) 10 MG tablet Take 10 mg by mouth at bedtime.    Marland Kitchen diltiazem (DILACOR XR) 240 MG 24 hr capsule Take 240 mg by mouth daily.    . enalapril (VASOTEC) 20 MG tablet Take 20 mg by mouth 2 (two) times daily.    . hydrALAZINE (APRESOLINE)  25 MG tablet Take 25 mg by mouth 3 (three) times daily.    . metoprolol tartrate (LOPRESSOR) 50 MG tablet Take 25 mg by mouth 2 (two) times daily.      No results found for this or any previous visit (from the past 48 hour(s)). No results found.  Review of Systems  All other systems reviewed and are negative.   Blood pressure 116/82, pulse (!) 57, temperature 98 F (36.7 C), resp. rate 16, SpO2 97 %. Physical Exam Vitals reviewed.  HENT:     Nose: Nose  normal.     Mouth/Throat:     Mouth: Mucous membranes are moist.  Eyes:     Pupils: Pupils are equal, round, and reactive to light.  Cardiovascular:     Rate and Rhythm: Normal rate.     Pulses: Normal pulses.  Abdominal:     Comments: Stable large trucal obesity. Ostomy site marking noted.   Genitourinary:    Comments: No CVAT Musculoskeletal:        General: Normal range of motion.     Cervical back: Normal range of motion.  Skin:    General: Skin is warm.  Neurological:     General: No focal deficit present.     Mental Status: He is alert.  Psychiatric:        Mood and Affect: Mood normal.      Assessment/Plan CMP, CBC, Clears, Bowel Prep, 1/2 BP home BP meds, Stomal marking, C19 screen, Type and Screen in preparation for major extirpative surgery tomorrow. Risks (including mortality and non-cure), benefits, alternatives, expected peri-op course (5 hour surgery, 7 day hospital stay, home with home health v. Rehab) discussed exteneively previously and reiterated today. His obesity and baseline CV comorbidity increases risk of ALL peri-op complications, expecially infectious and CV including heart attack, CHF exacerbation, and mortality.     Alexis Frock, MD 10/26/2020, 12:48 PM

## 2020-10-26 NOTE — Plan of Care (Signed)
  Problem: Pain Managment: Goal: General experience of comfort will improve Outcome: Progressing   Problem: Safety: Goal: Ability to remain free from injury will improve Outcome: Progressing   

## 2020-10-26 NOTE — Consult Note (Addendum)
Zachary Shannon Nurse requested for preoperative stoma site marking  Discussed surgical procedure and stoma creation with patient.  Explained role of the [facility] nurse team.  Provided the patient with educational booklet and provided samples of pouching options.  Answered patient's questions.   Examined patient sitting and standing in order to place the marking in the patient's visual field, away from any creases or abdominal contour issues and within the rectus muscle.  Attempted to mark below the patient's belt line, but this was not possible since a significant crease occurs lower on the abd when the patient leans forward and should be avoided if possible.    Marked for ileal conduit in the RLQ __3__  cm to the right of the umbilicus and  __0__ cm above the umbilicus.  Patient's abdomen cleansed with CHG wipes at site markings, allowed to air dry prior to marking. Pt plans for surgery tomorrow.  [facility] Nurse team will follow up with patient after surgery for continued ostomy care and teaching. Julien Girt MSN, RN, [place], [place], [place]

## 2020-10-27 ENCOUNTER — Inpatient Hospital Stay (HOSPITAL_COMMUNITY): Payer: Medicare Other | Admitting: Certified Registered Nurse Anesthetist

## 2020-10-27 ENCOUNTER — Encounter (HOSPITAL_COMMUNITY): Payer: Self-pay | Admitting: Urology

## 2020-10-27 ENCOUNTER — Encounter (HOSPITAL_COMMUNITY)
Admission: RE | Disposition: A | Discharge status: Home Health | Payer: Self-pay | Source: Ambulatory Visit | Attending: Urology

## 2020-10-27 HISTORY — PX: LYMPH NODE DISSECTION: SHX5087

## 2020-10-27 HISTORY — PX: CYSTOSCOPY WITH INJECTION: SHX1424

## 2020-10-27 HISTORY — PX: ROBOT ASSISTED LAPAROSCOPIC COMPLETE CYSTECT ILEAL CONDUIT: SHX5139

## 2020-10-27 LAB — SURGICAL PCR SCREEN
MRSA, PCR: NEGATIVE
Staphylococcus aureus: NEGATIVE

## 2020-10-27 LAB — HEMOGLOBIN AND HEMATOCRIT, BLOOD
HCT: 47.6 % (ref 39.0–52.0)
Hemoglobin: 15.4 g/dL (ref 13.0–17.0)

## 2020-10-27 LAB — ABO/RH: ABO/RH(D): A POS

## 2020-10-27 SURGERY — CYSTECTOMY, ROBOT-ASSISTED, WITH ILEAL CONDUIT CREATION
Anesthesia: General

## 2020-10-27 MED ORDER — STERILE WATER FOR IRRIGATION IR SOLN
Status: DC | PRN
  Administered 2020-10-27: 3000 mL via INTRAVESICAL

## 2020-10-27 MED ORDER — ONDANSETRON HCL 4 MG/2ML IJ SOLN
INTRAMUSCULAR | Status: DC | PRN
  Administered 2020-10-27: 4 mg via INTRAVENOUS

## 2020-10-27 MED ORDER — ALVIMOPAN 12 MG PO CAPS
12.0000 mg | ORAL_CAPSULE | Freq: Two times a day (BID) | ORAL | Status: DC
  Administered 2020-10-28 – 2020-10-31 (×8): 12 mg via ORAL
  Filled 2020-10-27 (×8): qty 1

## 2020-10-27 MED ORDER — ONDANSETRON HCL 4 MG/2ML IJ SOLN: 4.0000 mg | INTRAMUSCULAR | Status: DC | PRN

## 2020-10-27 MED ORDER — ROCURONIUM BROMIDE 10 MG/ML (PF) SYRINGE
PREFILLED_SYRINGE | INTRAVENOUS | Status: DC | PRN
  Administered 2020-10-27: 30 mg via INTRAVENOUS
  Administered 2020-10-27 (×3): 20 mg via INTRAVENOUS
  Administered 2020-10-27: 100 mg via INTRAVENOUS

## 2020-10-27 MED ORDER — PIPERACILLIN-TAZOBACTAM 3.375 G IVPB
INTRAVENOUS | Status: AC
  Filled 2020-10-27: qty 50

## 2020-10-27 MED ORDER — OXYCODONE HCL 5 MG PO TABS
5.0000 mg | ORAL_TABLET | ORAL | Status: DC | PRN
Start: 2020-10-27 — End: 2020-11-02
  Administered 2020-10-27 – 2020-11-02 (×2): 5 mg via ORAL
  Filled 2020-10-27 (×2): qty 1

## 2020-10-27 MED ORDER — OXYCODONE HCL 5 MG/5ML PO SOLN: 5.0000 mg | Freq: Once | ORAL | Status: DC | PRN

## 2020-10-27 MED ORDER — SENNOSIDES-DOCUSATE SODIUM 8.6-50 MG PO TABS
2.0000 | ORAL_TABLET | Freq: Every day | ORAL | Status: DC
  Administered 2020-10-27 – 2020-10-31 (×5): 2 via ORAL
  Filled 2020-10-27 (×6): qty 2

## 2020-10-27 MED ORDER — FENTANYL CITRATE (PF) 100 MCG/2ML IJ SOLN
INTRAMUSCULAR | Status: AC
  Filled 2020-10-27: qty 2

## 2020-10-27 MED ORDER — CHLORHEXIDINE GLUCONATE CLOTH 2 % EX PADS
6.0000 | MEDICATED_PAD | Freq: Every day | CUTANEOUS | Status: DC
  Administered 2020-10-28 – 2020-11-01 (×5): 6 via TOPICAL

## 2020-10-27 MED ORDER — ONDANSETRON HCL 4 MG/2ML IJ SOLN
INTRAMUSCULAR | Status: AC
  Filled 2020-10-27: qty 2

## 2020-10-27 MED ORDER — METOPROLOL TARTRATE 25 MG PO TABS
25.0000 mg | ORAL_TABLET | Freq: Once | ORAL | Status: AC
  Administered 2020-10-27: 25 mg via ORAL
  Filled 2020-10-27: qty 1

## 2020-10-27 MED ORDER — LACTATED RINGERS IV SOLN: INTRAVENOUS | Status: DC | PRN

## 2020-10-27 MED ORDER — LACTATED RINGERS IR SOLN
Status: DC | PRN
  Administered 2020-10-27: 1000 mL

## 2020-10-27 MED ORDER — CHLORHEXIDINE GLUCONATE 0.12 % MT SOLN
15.0000 mL | Freq: Once | OROMUCOSAL | Status: AC
  Administered 2020-10-27: 15 mL via OROMUCOSAL

## 2020-10-27 MED ORDER — SODIUM CHLORIDE (PF) 0.9 % IJ SOLN
INTRAMUSCULAR | Status: AC
  Filled 2020-10-27: qty 20

## 2020-10-27 MED ORDER — ESMOLOL HCL 100 MG/10ML IV SOLN
INTRAVENOUS | Status: DC | PRN
  Administered 2020-10-27: 20 mg via INTRAVENOUS
  Administered 2020-10-27: 10 mg via INTRAVENOUS

## 2020-10-27 MED ORDER — FENTANYL CITRATE (PF) 250 MCG/5ML IJ SOLN
INTRAMUSCULAR | Status: AC
  Filled 2020-10-27: qty 5

## 2020-10-27 MED ORDER — HYDROMORPHONE HCL 1 MG/ML IJ SOLN
0.5000 mg | INTRAMUSCULAR | Status: DC | PRN
  Administered 2020-10-27: 1 mg via INTRAVENOUS
  Filled 2020-10-27 (×3): qty 1

## 2020-10-27 MED ORDER — METOPROLOL TARTRATE 5 MG/5ML IV SOLN
INTRAVENOUS | Status: DC | PRN
  Administered 2020-10-27 (×5): 1 mg via INTRAVENOUS

## 2020-10-27 MED ORDER — SUGAMMADEX SODIUM 200 MG/2ML IV SOLN
INTRAVENOUS | Status: DC | PRN
  Administered 2020-10-27: 250 mg via INTRAVENOUS

## 2020-10-27 MED ORDER — DEXAMETHASONE SODIUM PHOSPHATE 10 MG/ML IJ SOLN
INTRAMUSCULAR | Status: DC | PRN
  Administered 2020-10-27: 5 mg via INTRAVENOUS

## 2020-10-27 MED ORDER — DIPHENHYDRAMINE HCL 50 MG/ML IJ SOLN: 12.5000 mg | Freq: Four times a day (QID) | INTRAMUSCULAR | Status: DC | PRN

## 2020-10-27 MED ORDER — BUPIVACAINE LIPOSOME 1.3 % IJ SUSP
INTRAMUSCULAR | Status: DC | PRN
  Administered 2020-10-27: 20 mL

## 2020-10-27 MED ORDER — ACETAMINOPHEN 500 MG PO TABS: 1000.0000 mg | ORAL_TABLET | Freq: Once | ORAL | Status: DC

## 2020-10-27 MED ORDER — PROPOFOL 10 MG/ML IV BOLUS
INTRAVENOUS | Status: AC
  Filled 2020-10-27: qty 20

## 2020-10-27 MED ORDER — ACETAMINOPHEN 10 MG/ML IV SOLN
1000.0000 mg | Freq: Four times a day (QID) | INTRAVENOUS | Status: AC
  Administered 2020-10-27 – 2020-10-28 (×4): 1000 mg via INTRAVENOUS
  Filled 2020-10-27 (×4): qty 100

## 2020-10-27 MED ORDER — PIPERACILLIN-TAZOBACTAM 3.375 G IVPB 30 MIN
3.3750 g | Freq: Three times a day (TID) | INTRAVENOUS | Status: DC
  Administered 2020-10-27: 3.375 g via INTRAVENOUS

## 2020-10-27 MED ORDER — BUPIVACAINE LIPOSOME 1.3 % IJ SUSP
20.0000 mL | Freq: Once | INTRAMUSCULAR | Status: DC
  Filled 2020-10-27: qty 20

## 2020-10-27 MED ORDER — LACTATED RINGERS IV SOLN: INTRAVENOUS | Status: DC

## 2020-10-27 MED ORDER — ONDANSETRON HCL 4 MG/2ML IJ SOLN: 4.0000 mg | Freq: Once | INTRAMUSCULAR | Status: DC | PRN

## 2020-10-27 MED ORDER — DIPHENHYDRAMINE HCL 12.5 MG/5ML PO ELIX: 12.5000 mg | ORAL_SOLUTION | Freq: Four times a day (QID) | ORAL | Status: DC | PRN

## 2020-10-27 MED ORDER — FENTANYL CITRATE (PF) 100 MCG/2ML IJ SOLN
INTRAMUSCULAR | Status: DC | PRN
  Administered 2020-10-27 (×2): 50 ug via INTRAVENOUS
  Administered 2020-10-27 (×2): 100 ug via INTRAVENOUS

## 2020-10-27 MED ORDER — LIDOCAINE 2% (20 MG/ML) 5 ML SYRINGE
INTRAMUSCULAR | Status: DC | PRN
  Administered 2020-10-27: 60 mg via INTRAVENOUS

## 2020-10-27 MED ORDER — SODIUM CHLORIDE (PF) 0.9 % IJ SOLN
INTRAMUSCULAR | Status: DC | PRN
  Administered 2020-10-27: 20 mL

## 2020-10-27 MED ORDER — HYDROMORPHONE HCL 1 MG/ML IJ SOLN
0.2500 mg | INTRAMUSCULAR | Status: DC | PRN
  Administered 2020-10-27 (×2): 0.5 mg via INTRAVENOUS

## 2020-10-27 MED ORDER — HYDROMORPHONE HCL 1 MG/ML IJ SOLN
INTRAMUSCULAR | Status: AC
  Administered 2020-10-27: 1 mg via INTRAVENOUS
  Filled 2020-10-27: qty 1

## 2020-10-27 MED ORDER — ORAL CARE MOUTH RINSE: 15.0000 mL | Freq: Once | OROMUCOSAL | Status: AC

## 2020-10-27 MED ORDER — MIDAZOLAM HCL 2 MG/2ML IJ SOLN
INTRAMUSCULAR | Status: AC
  Filled 2020-10-27: qty 2

## 2020-10-27 MED ORDER — DEXTROSE-NACL 5-0.45 % IV SOLN: INTRAVENOUS | Status: DC

## 2020-10-27 MED ORDER — MIDAZOLAM HCL 5 MG/5ML IJ SOLN
INTRAMUSCULAR | Status: DC | PRN
  Administered 2020-10-27: 2 mg via INTRAVENOUS

## 2020-10-27 MED ORDER — PIPERACILLIN-TAZOBACTAM 3.375 G IVPB
3.3750 g | Freq: Three times a day (TID) | INTRAVENOUS | Status: AC
  Administered 2020-10-27 – 2020-10-28 (×2): 3.375 g via INTRAVENOUS
  Filled 2020-10-27 (×2): qty 50

## 2020-10-27 MED ORDER — OXYCODONE HCL 5 MG PO TABS: 5.0000 mg | ORAL_TABLET | Freq: Once | ORAL | Status: DC | PRN

## 2020-10-27 MED ORDER — WATER FOR IRRIGATION, STERILE IR SOLN
Status: DC | PRN
  Administered 2020-10-27: 1000 mL

## 2020-10-27 MED ORDER — PROPOFOL 10 MG/ML IV BOLUS
INTRAVENOUS | Status: DC | PRN
  Administered 2020-10-27: 150 mg via INTRAVENOUS

## 2020-10-27 MED ORDER — PHENYLEPHRINE HCL (PRESSORS) 10 MG/ML IV SOLN
INTRAVENOUS | Status: DC | PRN
  Administered 2020-10-27: 40 ug via INTRAVENOUS
  Administered 2020-10-27: 80 ug via INTRAVENOUS

## 2020-10-27 SURGICAL SUPPLY — 99 items
APPLICATOR COTTON TIP 6 STRL (MISCELLANEOUS) ×2 IMPLANT
APPLICATOR COTTON TIP 6IN STRL (MISCELLANEOUS) ×3
APPLICATOR SURGIFLO ENDO (HEMOSTASIS) IMPLANT
BAG LAPAROSCOPIC 12 15 PORT 16 (BASKET) ×2 IMPLANT
BAG RETRIEVAL 12/15 (BASKET) ×3
BAG URO CATCHER STRL LF (MISCELLANEOUS) ×3 IMPLANT
BENZOIN TINCTURE PRP APPL 2/3 (GAUZE/BANDAGES/DRESSINGS) IMPLANT
BLADE SURG SZ10 CARB STEEL (BLADE) IMPLANT
CATH SILICONE 5CC 18FR (INSTRUMENTS) ×3 IMPLANT
CELLS DAT CNTRL 66122 CELL SVR (MISCELLANEOUS) ×2 IMPLANT
CHLORAPREP W/TINT 26 (MISCELLANEOUS) ×3 IMPLANT
CLIP VESOLOCK LG 6/CT PURPLE (CLIP) ×6 IMPLANT
CLIP VESOLOCK MED LG 6/CT (CLIP) ×3 IMPLANT
CLIP VESOLOCK XL 6/CT (CLIP) ×6 IMPLANT
CLOTH BEACON ORANGE TIMEOUT ST (SAFETY) ×3 IMPLANT
CNTNR URN SCR LID CUP LEK RST (MISCELLANEOUS) ×2 IMPLANT
CONT SPEC 4OZ STRL OR WHT (MISCELLANEOUS) ×1
COVER SURGICAL LIGHT HANDLE (MISCELLANEOUS) ×3 IMPLANT
COVER TIP SHEARS 8 DVNC (MISCELLANEOUS) ×2 IMPLANT
COVER TIP SHEARS 8MM DA VINCI (MISCELLANEOUS) ×1
COVER WAND RF STERILE (DRAPES) IMPLANT
DECANTER SPIKE VIAL GLASS SM (MISCELLANEOUS) ×3 IMPLANT
DERMABOND ADVANCED (GAUZE/BANDAGES/DRESSINGS) ×1
DERMABOND ADVANCED .7 DNX12 (GAUZE/BANDAGES/DRESSINGS) ×2 IMPLANT
DRAIN CHANNEL RND F F (WOUND CARE) IMPLANT
DRAIN PENROSE 0.5X18 (DRAIN) IMPLANT
DRAPE ARM DVNC X/XI (DISPOSABLE) ×8 IMPLANT
DRAPE COLUMN DVNC XI (DISPOSABLE) ×2 IMPLANT
DRAPE DA VINCI XI ARM (DISPOSABLE) ×4
DRAPE DA VINCI XI COLUMN (DISPOSABLE) ×1
DRAPE SURG IRRIG POUCH 19X23 (DRAPES) ×3 IMPLANT
DRSG TEGADERM 4X4.75 (GAUZE/BANDAGES/DRESSINGS) IMPLANT
ELECT PENCIL ROCKER SW 15FT (MISCELLANEOUS) ×3 IMPLANT
ELECT REM PT RETURN 15FT ADLT (MISCELLANEOUS) ×3 IMPLANT
GAUZE 4X4 16PLY RFD (DISPOSABLE) IMPLANT
GLOVE SURG ENC MOIS LTX SZ6.5 (GLOVE) ×6 IMPLANT
GLOVE SURG ENC TEXT LTX SZ7.5 (GLOVE) ×9 IMPLANT
GLOVE SURG UNDER POLY LF SZ7.5 (GLOVE) IMPLANT
GOWN STRL REUS W/TWL LRG LVL3 (GOWN DISPOSABLE) ×15 IMPLANT
IRRIG SUCT STRYKERFLOW 2 WTIP (MISCELLANEOUS) ×3
IRRIGATION SUCT STRKRFLW 2 WTP (MISCELLANEOUS) ×2 IMPLANT
KIT PROCEDURE DA VINCI SI (MISCELLANEOUS)
KIT PROCEDURE DVNC SI (MISCELLANEOUS) IMPLANT
KIT TURNOVER KIT A (KITS) ×3 IMPLANT
LOOP VESSEL MAXI BLUE (MISCELLANEOUS) ×3 IMPLANT
MANIFOLD NEPTUNE II (INSTRUMENTS) ×3 IMPLANT
NEEDLE ASPIRATION 22 (NEEDLE) ×3 IMPLANT
NEEDLE INSUFFLATION 14GA 120MM (NEEDLE) ×3 IMPLANT
PACK CYSTO (CUSTOM PROCEDURE TRAY) ×3 IMPLANT
PACK ROBOT UROLOGY CUSTOM (CUSTOM PROCEDURE TRAY) ×3 IMPLANT
PAD POSITIONING PINK XL (MISCELLANEOUS) ×3 IMPLANT
PORT ACCESS TROCAR AIRSEAL 12 (TROCAR) ×2 IMPLANT
PORT ACCESS TROCAR AIRSEAL 5M (TROCAR) ×1
RELOAD STAPLER GREEN 60MM (STAPLE) ×10 IMPLANT
RELOAD STAPLER WHITE 60MM (STAPLE) ×14 IMPLANT
RETRACTOR LONRSTAR 16.6X16.6CM (MISCELLANEOUS) IMPLANT
RETRACTOR STAY HOOK 5MM (MISCELLANEOUS) IMPLANT
RETRACTOR STER APS 16.6X16.6CM (MISCELLANEOUS)
RTRCTR WOUND ALEXIS 18CM MED (MISCELLANEOUS) ×3
SEAL CANN UNIV 5-8 DVNC XI (MISCELLANEOUS) ×8 IMPLANT
SEAL XI 5MM-8MM UNIVERSAL (MISCELLANEOUS) ×4
SET TRI-LUMEN FLTR TB AIRSEAL (TUBING) ×3 IMPLANT
SOLUTION ELECTROLUBE (MISCELLANEOUS) ×3 IMPLANT
SPONGE LAP 18X18 RF (DISPOSABLE) ×6 IMPLANT
SPONGE LAP 4X18 RFD (DISPOSABLE) ×3 IMPLANT
STAPLER ECHELON LONG 60 440 (INSTRUMENTS) ×3 IMPLANT
STAPLER RELOAD GREEN 60MM (STAPLE) ×15
STAPLER RELOAD WHITE 60MM (STAPLE) ×21
STENT SET URETHERAL LEFT 7FR (STENTS) ×3 IMPLANT
STENT SET URETHERAL RIGHT 7FR (STENTS) ×3 IMPLANT
SURGIFLO W/THROMBIN 8M KIT (HEMOSTASIS) IMPLANT
SUT CHROMIC 4 0 RB 1X27 (SUTURE) ×3 IMPLANT
SUT ETHIBOND CT1 BRD #0 30IN (SUTURE) ×27 IMPLANT
SUT ETHILON 3 0 PS 1 (SUTURE) ×3 IMPLANT
SUT MNCRL AB 4-0 PS2 18 (SUTURE) ×6 IMPLANT
SUT PDS AB 0 CTX 36 PDP370T (SUTURE) ×9 IMPLANT
SUT SILK 3 0 SH 30 (SUTURE) IMPLANT
SUT SILK 3 0 SH CR/8 (SUTURE) ×3 IMPLANT
SUT V-LOC BARB 180 2/0GR6 GS22 (SUTURE)
SUT VIC AB 2-0 CT1 27 (SUTURE)
SUT VIC AB 2-0 CT1 27XBRD (SUTURE) IMPLANT
SUT VIC AB 2-0 SH 18 (SUTURE) IMPLANT
SUT VIC AB 2-0 UR5 27 (SUTURE) ×12 IMPLANT
SUT VIC AB 3-0 SH 27 (SUTURE) ×6
SUT VIC AB 3-0 SH 27X BRD (SUTURE) ×4 IMPLANT
SUT VIC AB 3-0 SH 27XBRD (SUTURE) ×8 IMPLANT
SUT VIC AB 4-0 RB1 27 (SUTURE) ×4
SUT VIC AB 4-0 RB1 27XBRD (SUTURE) ×8 IMPLANT
SUT VLOC BARB 180 ABS3/0GR12 (SUTURE) ×3
SUTURE V-LC BRB 180 2/0GR6GS22 (SUTURE) IMPLANT
SUTURE VLOC BRB 180 ABS3/0GR12 (SUTURE) ×2 IMPLANT
SYR CONTROL 10ML LL (SYRINGE) IMPLANT
SYSTEM UROSTOMY GENTLE TOUCH (WOUND CARE) ×3 IMPLANT
TOWEL OR NON WOVEN STRL DISP B (DISPOSABLE) ×3 IMPLANT
TROCAR BLADELESS 15MM (ENDOMECHANICALS) ×3 IMPLANT
TROCAR XCEL NON-BLD 5MMX100MML (ENDOMECHANICALS) IMPLANT
TUBING CONNECTING 10 (TUBING) ×3 IMPLANT
WATER STERILE IRR 1000ML POUR (IV SOLUTION) ×3 IMPLANT
WATER STERILE IRR 3000ML UROMA (IV SOLUTION) ×3 IMPLANT

## 2020-10-27 NOTE — Progress Notes (Signed)
Patient transported to the OR for procedure.

## 2020-10-27 NOTE — Discharge Instructions (Signed)

## 2020-10-27 NOTE — Anesthesia Preprocedure Evaluation (Addendum)
Anesthesia Evaluation  Patient identified by MRN, date of birth, ID band Patient awake    Reviewed: Allergy & Precautions, NPO status , Patient's Chart, lab work & pertinent test results, reviewed documented beta blocker date and time   Airway Mallampati: III  TM Distance: >3 FB Neck ROM: Full    Dental no notable dental hx. (+) Teeth Intact, Dental Advisory Given   Pulmonary former smoker,  25 pack year history, quit 2010   Pulmonary exam normal breath sounds clear to auscultation       Cardiovascular hypertension, Pt. on medications and Pt. on home beta blockers + CAD (LAD occluded but good collaterals on cath 2021), +CHF (hx ICM as low as 20-25% in 2009, has improved to 45-50% per cardiologist note) and + DVT (While he was off of the eliquis for his bladder surgery due to bladder mass he developed a left lower extremity DVT.)  Normal cardiovascular exam+ dysrhythmias (eliquis- LD: ) Atrial Fibrillation  Rhythm:Regular Rate:Normal  cath 2009 done at [facility] that showed CTO of LAD with excellent collateralization from LCx and RCA, no PCI performed, sig. cardiomyopathy ef 20-25%;  per note stated improved to 45-50% (no documentation available in note and no echo)   Neuro/Psych negative neurological ROS     GI/Hepatic negative GI ROS, Neg liver ROS,   Endo/Other  Obesity BMI 36  Renal/GU negative Renal ROS Bladder dysfunction (bladder ca )      Musculoskeletal negative musculoskeletal ROS (+)   Abdominal (+) + obese,   Peds  Hematology negative hematology ROS (+) hct 49.7, plt 256   Anesthesia Other Findings B/L inguinal hernias   Reproductive/Obstetrics                            Anesthesia Physical Anesthesia Plan  ASA: III  Anesthesia Plan: General   Post-op Pain Management:    Induction: Intravenous  PONV Risk Score and Plan: 3 and Ondansetron, Dexamethasone and  Treatment may vary due to age or medical condition  Airway Management Planned: Oral ETT  Additional Equipment: None  Intra-op Plan:   Post-operative Plan: Extubation in OR  Informed Consent: I have reviewed the patients History and Physical, chart, labs and discussed the procedure including the risks, benefits and alternatives for the proposed anesthesia with the patient or authorized representative who has indicated his/her understanding and acceptance.     Dental advisory given  Plan Discussed with: CRNA  Anesthesia Plan Comments:        Anesthesia Quick Evaluation

## 2020-10-27 NOTE — Anesthesia Procedure Notes (Signed)
Procedure Name: Intubation Performed by: Arlander Gillen J, CRNA Pre-anesthesia Checklist: Patient identified, Emergency Drugs available, Suction available, Patient being monitored and Timeout performed Patient Re-evaluated:Patient Re-evaluated prior to induction Oxygen Delivery Method: Circle system utilized Preoxygenation: Pre-oxygenation with 100% oxygen Induction Type: IV induction Ventilation: Mask ventilation without difficulty Laryngoscope Size: Mac and 4 Grade View: Grade I Tube type: Oral Tube size: 7.5 mm Number of attempts: 1 Airway Equipment and Method: Stylet Placement Confirmation: ETT inserted through vocal cords under direct vision,  positive ETCO2 and breath sounds checked- equal and bilateral Secured at: 23 cm Tube secured with: Tape Dental Injury: Teeth and Oropharynx as per pre-operative assessment        

## 2020-10-27 NOTE — Transfer of Care (Signed)
Immediate Anesthesia Transfer of Care Note  Patient: Zachary Shannon.  Procedure(s) Performed: XI ROBOTIC ASSISTED LAPAROSCOPIC COMPLETE CYSTECT ILEAL CONDUIT, RADICAL PROSTATECTOMY AND BILATERAL INGUINAL HERNIA REPAIR (N/A ) LYMPH NODE DISSECTION (Bilateral ) CYSTOSCOPY WITH INJECTION OF INDOCYANINE GREEN DYE (N/A )  Patient Location: PACU  Anesthesia Type:General  Level of Consciousness: sedated, patient cooperative and responds to stimulation  Airway & Oxygen Therapy: Patient Spontanous Breathing and Patient connected to face mask oxygen  Post-op Assessment: Report given to RN and Post -op Vital signs reviewed and stable  Post vital signs: Reviewed and stable  Last Vitals:  Vitals Value Taken Time  BP    Temp    Pulse    Resp    SpO2      Last Pain:  Vitals:   10/27/20 0745  TempSrc:   PainSc: 0-No pain         Complications: No complications documented.

## 2020-10-27 NOTE — Brief Op Note (Signed)
10/26/2020 - 10/27/2020  2:43 PM  PATIENT:  Zachary Shannon.  70 y.o. male  PRE-OPERATIVE DIAGNOSIS:  BLADDER CANCER, BILATERAL INGUINAL HERNIAS  POST-OPERATIVE DIAGNOSIS:  BLADDER CANCER, BILATERAL INGUINAL HERNIAS  PROCEDURE:  Procedure(s) with comments: XI ROBOTIC ASSISTED LAPAROSCOPIC COMPLETE CYSTECT ILEAL CONDUIT, RADICAL PROSTATECTOMY AND BILATERAL INGUINAL HERNIA REPAIR (N/A) - 6 HRS LYMPH NODE DISSECTION (Bilateral) CYSTOSCOPY WITH INJECTION OF INDOCYANINE GREEN DYE (N/A)  SURGEON:  Surgeon(s) and Role:    Alexis Frock, MD - Primary  PHYSICIAN ASSISTANT:   ASSISTANTS: Clemetine Marker PA   ANESTHESIA:   local and general  EBL:  400 mL   BLOOD ADMINISTERED:none  DRAINS: 1 - JP to bulb; 2- RLQ Urostomy to gravity wtih Rt (red) and Lt (blue) bander stents   LOCAL MEDICATIONS USED:  MARCAINE     SPECIMEN:  Source of Specimen:  1 - ureteral margins; 2- pelvic lymph nodes; 3 - bladder + prostate  DISPOSITION OF SPECIMEN:  PATHOLOGY  COUNTS:  YES  TOURNIQUET:  * No tourniquets in log *  DICTATION: .Other Dictation: Dictation Number [id number]  PLAN OF CARE: Admit to inpatient   PATIENT DISPOSITION:  PACU - hemodynamically stable.   Delay start of Pharmacological VTE agent (>24hrs) due to surgical blood loss or risk of bleeding: yes

## 2020-10-27 NOTE — Op Note (Signed)
NAME: BOSTEN, NEWSTROM MEDICAL RECORD NO: [id number] ACCOUNT NO: [id number] DATE OF BIRTH: 05-20-1951 FACILITY: [facility] LOCATION: [id number] PHYSICIAN: Alexis Frock, MD  Operative Report   DATE OF PROCEDURE: 10/27/2020  PREOPERATIVE DIAGNOSIS:  Recurrent high-grade bladder cancer.  PROCEDURE PERFORMED: 1.  Cystoscopy, injection of indocyanine dye. 2.  Robotic-assisted laparoscopic radical cystectomy. 3.  Radical prostatectomy, robotic. 4.  Pelvic lymph node dissection. 5.  Bilateral inguinal hernia repair, laparoscopic.  ESTIMATED BLOOD LOSS:  350 mL.  ASSISTANT:  Bari Mantis, PA  SPECIMENS: 1.  Right distal ureteral margin frozen section negative. 2.  Final right distal ureteral margin. 3.  Left distal ureteral margin frozen section negative. 4.  Final left distal ureteral margin. 5.  Bladder plus prostate en bloc. 6.  Right external iliac lymph node, sentinel. 7.  Right obturator lymph node, sentinel. 8.  Left external iliac lymph node, sentinel. 9.  Left obturator lymph node, sentinel.  FINDINGS:    1.  Significant intra-abdominal obesity as expected. 2.  Very large right greater than left indirect inguinal hernias. 3.  Resolution of hernia defects following hernia repair bilaterally.  DRAINS: 1.  Jackson-Pratt drain to bulb suction. 2.  Right lower quadrant urostomy with right (red) and left (blue) bander stents.  INDICATIONS:  The patient is a pleasant, but somewhat comorbid 70 year old male with history of significant obesity and arrhythmia.  He has a history of recurrent high-grade bladder cancer.  He is status post BCG induction and has been recurrent despite  this. He has clinically localized disease.  Options were discussed for further management including bladder preservation versus cystectomy with the latter being most definitive for cure of his recurrent cancer, and he wished to proceed.  He was admitted  yesterday for bowel prep, stomal marking, and labs,  which were all favorable.  Informed consent was obtained and placed in the medical record.  PROCEDURE IN DETAIL:  The patient being Mayford Alberg identified, the procedure being radical cystectomy and prostatectomy, pelvic node dissection, and bilateral inguinal hernia repair was confirmed.  Procedure timeout was performed.  Intravenous  antibiotics were administered.  General endotracheal anesthesia was induced.  The patient was placed in the low lithotomy position.  Sterile field was created, prepped and draped the patient's penis, perineum, and proximal thighs and his infraxiphoid  abdomen using chlorhexidine gluconate after clipper shaving.  He was further fastened to the operating table using 3-inch tape over foam padding across supraxiphoid chest and his arms were tucked to his side using gel rolls.  A test for steep  Trendelenburg positioning was performed.  He was found to be suitably positioned.  Attention was directed at cystoscopy with injection of indocyanine green dye.  Cystourethroscopy was performed using a 24-French injection scope with 0-degree lens.   Inspection of anterior and posterior urethra was unremarkable.  Inspection of the bladder revealed minimal volume of intraluminal urothelial neoplasm at this point.  He was noted to previously have significant volume of disease in his bladder neck area.   Therefore, 2 mL of indocyanine green dye was injected across approximately 6 submucosal blebs in this area, and a silicone catheter was placed per urethra to straight drain.  Next, high-flow, low-pressure pneumoperitoneum was obtained using Veress  technique in the supraumbilical midline and passed the aspiration drop test.  An 8 mm robotic camera port was then placed in same location. Laparoscopic examination of peritoneal cavity did reveal some adhesions of both the sigmoid and of the cecal area.   No visceral  injury.  Additional ports were placed as follows:  Right paramedian 8 mm  robotic port, right far lateral 12 mm AirSeal assistant port, right paramedian 15 mm assistant port at the previously marked stomal site, left paramedian 8 mm robotic  port, left far lateral 8 mm robotic port.  Robot was docked and passed the electronic checks.  Next, attention was directed at limited adhesiolysis.  Several relatively loose adhesions were taken down from the lower anterior lateral abdominal wall and  the distal ileum as well as a cecal area, thus providing further mobility of the distal small bowel allowing retraction of the pelvis.  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  The attachments were carefully taken down using cold scissors, which resulted in some straightening of the sigmoid area; however, there still  did remain some significant redundancy.  Attention was directed to the left retroperitoneal dissection.  An incision was made lateral to the descending colon from the area of the iliac vessels superiorly for a distance approximately 10 cm and distally  coursing along the left side of the medial umbilical ligament towards the anterior abdominal wall.  This created a very large retroperitoneal flap, which was developed and retracted medially.  The left ureter was encountered as it coursed over the iliac  vessels, marked with a vessel loop, dissected proximally to distance of approximately 8 cm, distally to the ureterovesical junction, which was doubly clipped and ligated with proximal clip containing a dyed tag sutured.  Frozen section negative for  carcinoma.  The left ureter was tucked out of the true pelvis.  The left side of the pelvis was inspected under near infrared fluorescence imaging.  There were noted to be several areas of sentinel lymph nodes prominently external iliac and obturator  groups respectively.   Given the patient's significant intra-abdominal adiposity, it was essentially impossible to safely visualize the area of the common iliac lymph nodes.  As such, lymphadenectomy was performed first of the left external iliac group with the boundaries  being left external iliac artery, vein, pelvic sidewall, iliac bifurcation.  Lymphostasis was achieved with cold clips, set aside and labeled as external and internal iliac lymph nodes sentinel.  Next, left obturator group was dissected free with  boundaries being external iliac vein, pelvic sidewall, obturator nerve.  Lymphostasis was achieved with cold clips, set aside and labeled as obturator lymph nodes.  Left obturator nerve was inspected and found to be uninjured.  Dissection then proceeded  inferiorly lateral to the left side of the prostate through the endopelvic fascia towards the area of the apex on the left side.  This completed the left retroperitoneal dissection.  Attention was then directed at the right side.  The ileocecal junction  was identified by noting small bowel entering the area of the cecum opposite the area of the umbilicus.  This was traced proximally for a distance of approximately 14 cm to a segment of distal ileum that appeared to have suitable mesenteric mobility for  conduit formation.  This was marked using silk tag suture at the level of the epiploic fat and another clip distal to this for proximal distal orientation.  An incision was then made lateral to the cecum superiorly at a distance approximately 10 cm,  inferiorly coursing along the plane just lateral to the right median umbilical ligament  towards the anterior abdominal wall.  An I-shaped extension of this retroperitoneal incision was made along the presumed course of the common iliac vessels towards  the area of the aortic bifurcation.  This created a very large retroperitoneal flap on the right side, which was then used to retract the bowel  medially.  The right ureter was encountered as it coursed over the iliac vessels, marked with a vessel loop,  dissected proximally for a distance of approximately 8 cm distally to the ureterovesical junction, was doubly clipped and ligated, proximal clip containing a white tagged suture.  Frozen section negative for carcinoma.  The right ureter was tucked out of  the true pelvis.  Next, right-sided lymphadenectomy was performed of the right external and right obturator group respectively as per the left. Lymphostasis was similarly achieved with cold clips, and the right obturator nerve was inspected and found to  be uninjured.  During the lateral bladder dissection, a very large hernia was clearly noted as expected on both sides, especially on the right.  This at this point contained only just peritoneal hernia sac and a large amount of perivesical fat.  This  was completely reduced with the large hernia sac fat remaining with the bladder area.  This exposed the circumferential edges of the fascia of the large hernia defect.  Attention then proceeded lateral to the bladder and prostate through the endopelvic  fascia towards the apex of the prostate.  This completed the right retroperitoneal dissection.  Attention was directed at the transposition of the left ureter.  A retroperitoneal tunnel was created just anterior to the aortic bifurcation and  posterior to the descending colon via the right-sided retroperitoneal dissection site.  Through this tunnel, the left ureter was brought through to the right side, and the right ureter, left ureter, terminal ileum tag sutures were placed into a Hem-o-lok  clip for later identification and all these structures were again tucked out of the true pelvis.  Attention was directed at posterior dissection.  A posterior peritoneal flap was created by connecting the previous inferior aspect  of the retroperitoneal incisions and this plane was developed behind the area of the  vas and seminal vesicles towards the area of the apex of the prostate.  This was further developed laterally and exposed the vascular pedicles of the bladder and  prostate.  These were controlled using white load stapler x2 each side taking exquisite care to avoid any injury of the obturator nerve, pelvic vasculature, or rectum.  Space of Retzius was then developed by dropping the bladder between the areas of the  medial umbilical ligaments towards the area of the anterior aspect of the prostate.  This exposed the dorsal venous complex, which was controlled using a green load stapler taking exquisite care to avoid membranous urethral injury, which did not occur.   There was also nice hemostatic control of the dorsal venous complex.  The bladder plus prostate specimen was placed on superior traction.  The membranous urethra was coldly transected for the anterior 50% of its circumference, and the  Foley catheter was delivered through this site, clipped x2, then transected to be used as a bucket handle and the posterior circumference of urethra was then released.  This completely freed up the bladder plus prostate en bloc specimen, which was placed  in an extra-large EndoCatch bag for later retrieval.  Digital rectal exam was then performed using indicator glove under laparoscopic vision and no evidence of rectal violation was noted.  The membranous urethral stump was then oversewn using 3-0 V-Loc  to prevent prolonged penile drainage.  At this point, we achieved the extirpated portion of the procedure today.  Hemostasis was very good.  Sponge and needle counts were correct.  Given the patient's large hernia defect, right greater than left, it was  clearly felt to be at risk for future incarceration of bowel at these levels, and simple herniorrhaphy was clearly warranted.  As such, figure-of-eight Ethibond was used on the right side x4 reapproximating the fascia of the internal ring to the  Pectinate line on  the anterior pubic ramus, which revealed a complete resolution of the visible hernia defect, again to prevent bowel incarceration.  Left side also had an indirect hernia that was not quite as large and was similarly controlled  using figure-of-eight Ethibond x2.  A closed suction drain was brought to the previous left lateral most robotic port site in the area of the deep pelvis.  The specimen bag string was brought through the left paramedian robotic port site having placed  the specimen bag in the left hemiabdomen and the right ureter, left ureter, terminal ileum tag sutures were grasped using a laparoscopic grasper via the 15 mm port site, which remained on the entire right hemiabdomen.  Robot was then undocked.  The  specimen was retrieved by extending the previous came port site inferiorly on the left side of the umbilicus for a distance approximately 7 cm removing the bladder plus prostate specimen en bloc, setting aside as permanent pathology.  A wound protector  type retractor was placed via this site, and the right ureter, left ureter, terminal ileum site sutures were brought through this and these structures were regrasped using atraumatic Babcock forceps.  Attention was directed at conduit formation.   Previously marked segment of terminal ileum was visualized via the extraction site and a 14 cm segment of this was taken out of continuity using green load stapler proximal and distal.  The mesentery was developed with two loads of white load stapler  distal and proximal taking exquisite care to avoid devascularization of the conduit or anastomotic segments.  The conduit segment was aligned into retroperitoneal orientation, and bowel-to-bowel anastomosis was performed using a green load stapler x1.5  loads on the antimesenteric border.  The free end was oversewn using running silk with second imbricating layer of running silk.  The acute angle of the anastomosis was bolstered using interrupted silk.   The mesenteric defect was reapproximated using  interrupted silk.  The bowel-to-bowel anastomosis was visibly viable and palpably patent, redelivered into the abdominal cavity.  The proximal staple line of the conduit was excluded using a running Vicryl.  The distal staple line was removed as were the  proximal and distal tag clips.  Attention was then directed at the ureteroenteric anastomosis first of the left ureter.  The patient's habitus of course made this somewhat challenging in terms of his geometry, but certainly workable.  The left ureter  was spatulated for a distance of approximately 1 cm and final ureteral margin was sent and a 4 mm segment of proximal conduit towards the mesenteric aspect was excised and 4 mucosal everting sutures were applied at this level and a heel stitch of the  left ureter was placed in this location using interrupted Vicryl and a blue colored bander stent was advanced 26 cm to the anastomosis and further ureteroenteric anastomosis was performed using 2 separate running suture lines of 4-0 Vicryl in a  heel-to-toe fashion.  This resulted in excellent tension-free apposition.  The blue colored stent was then anchored to the midpoint of the conduit using interrupted purposeful air knot chromic to prevent inadvertent early displacement.  This completed  the left ureteroenteric anastomosis.  The right ureter was anastomosed as per the left, but in the contralateral portion of the small bowel proximal conduit area and a red colored bander stent placed 26 cm to the anastomosis.  Similarly, a permanent  final margin was sent.  The right ureter given the adherent geometry to the conduit was somewhat easier given more favorable geometry.  Conduit remained visibly viable.  The stomal site was developed by incising a quarter-size diameter column of skin and  fibrofatty tissue to the level of the fascia, which was dilated to accommodate 2 surgeon's fingers, was 4-quadrant fashioned,  2-0 Vicryls were placed, and the conduit was brought through this into proper mesenteric orientation, and the 2-0 Vicryl anchor  sutures were applied to prevent parastomal hernia formation and 4 rose budding sutures were applied in a quadrant fashion similarly and then set aside.  Extraction site was once again inspected.  Hemostasis was excellent.  Sponge and needle counts were  correct.  Omentum was brought over the area of the extraction site. Fascia was approximated using figure-of-eight PDS x6 followed by reapproximation of Scarpa's with running Vicryl.  All incision sites were infiltrated with dilute lipolyzed Marcaine and  closed at the level of skin using subcuticular Monocryl followed by Dermabond, and final conduit maturation was performed by placing interrupted 3-0 Vicryl x3 in a quadrant fashion, each quadrant resulted in excellent rosebudding and mucosal to skin  apposition of the conduit.  At this point, I was quite pleased with the outcome of surgery today, and the procedure was terminated.  The patient tolerated the procedure well.  No immediate complications.  The patient was taken to postanesthesia care unit  in stable condition with plan for progressive care admission.  Please note, first assistant, Clemetine Marker, was crucial for all portions of the surgery today.  She provided valuable retraction, suctioning, specimen angulation, vascular clipping, vascular stapling, lymphatic clipping, and general first assistance.   ROH D: 10/27/2020 3:03:05 pm T: 10/27/2020 7:55:00 pm  JOB: [id number]/ [id number]

## 2020-10-28 ENCOUNTER — Encounter (HOSPITAL_COMMUNITY): Payer: Self-pay | Admitting: Urology

## 2020-10-28 LAB — BASIC METABOLIC PANEL
Anion gap: 8 (ref 5–15)
BUN: 18 mg/dL (ref 8–23)
CO2: 24 mmol/L (ref 22–32)
Calcium: 8.2 mg/dL — ABNORMAL LOW (ref 8.9–10.3)
Chloride: 107 mmol/L (ref 98–111)
Creatinine, Ser: 1.67 mg/dL — ABNORMAL HIGH (ref 0.61–1.24)
GFR, Estimated: 44 mL/min — ABNORMAL LOW (ref >60)
Glucose, Bld: 156 mg/dL — ABNORMAL HIGH (ref 70–99)
Potassium: 5.2 mmol/L — ABNORMAL HIGH (ref 3.5–5.1)
Sodium: 139 mmol/L (ref 135–145)

## 2020-10-28 LAB — HEMOGLOBIN AND HEMATOCRIT, BLOOD
HCT: 42 % (ref 39.0–52.0)
Hemoglobin: 13.5 g/dL (ref 13.0–17.0)

## 2020-10-28 MED ORDER — HEPARIN SODIUM (PORCINE) 5000 UNIT/ML IJ SOLN
5000.0000 [IU] | Freq: Three times a day (TID) | INTRAMUSCULAR | Status: AC
  Administered 2020-10-28 – 2020-10-29 (×5): 5000 [IU] via SUBCUTANEOUS
  Filled 2020-10-28 (×5): qty 1

## 2020-10-28 MED ORDER — ACETAMINOPHEN 500 MG PO TABS
1000.0000 mg | ORAL_TABLET | Freq: Four times a day (QID) | ORAL | Status: DC
  Administered 2020-10-28 – 2020-11-02 (×17): 1000 mg via ORAL
  Filled 2020-10-28 (×19): qty 2

## 2020-10-28 MED ORDER — SODIUM CHLORIDE 0.9 % IV SOLN: INTRAVENOUS | Status: DC

## 2020-10-28 NOTE — Evaluation (Signed)
Physical Therapy Evaluation Patient Details Name: Zachary Shannon. MRN: [id number] DOB: Apr 18, 1951 Today's Date: 10/28/2020   History of Present Illness  Pt is a 70 yo male s/p Robotic-assisted laparoscopic radical cystectomy, Radical prostatectomy,  bil lymph node dissection, cystoscopy, Bilateral inguinal hernia repair. PMH: afib, DVT, morbid obesity, hip OA, bladder CA, CAD, HTN, cardiac cath 2009, TURP 12/21    Clinical Impression  Pt admitted with above diagnosis. Pt independent at baseline, completes yard work and drives. Pt currently requiring mod A with supine<>sit through sidelying via log rolling. Extensive education and verbal cues for supine<>sit, educated on log rolling, avoiding val salva and increased time to complete. Once sitting, pt doesn't require physical assist to rise to stand or ambulate, only min G for safety. Pt plans to d/c home with children present 24/7 for ~2 wks while he recovers; pt politely declines PT services at discharge. Pt currently with functional limitations due to the deficits listed below (see PT Problem List). Pt will benefit from skilled PT to increase their independence and safety with mobility to allow discharge to the venue listed below.       Follow Up Recommendations No PT follow up;Supervision/Assistance - 24 hour    Equipment Recommendations  Rolling walker with 5" wheels    Recommendations for Other Services       Precautions / Restrictions Precautions Precautions: Fall Precaution Comments: ostomy, JP drain Restrictions Weight Bearing Restrictions: No      Mobility  Bed Mobility Overal bed mobility: Needs Assistance Bed Mobility: Rolling;Sit to Sidelying;Sidelying to Sit Rolling: Min assist Sidelying to sit: Mod assist  Sit to sidelying: Mod assist General bed mobility comments: min A to come to sidelying, mod A to push through BUE into sitting with cues for log rolling technique, mod A to lift BLE back into bed into  sidelying then return to supine ind, cues to avoid valsalva maneuver to avoid increased pain    Transfers Overall transfer level: Needs assistance Equipment used: Rolling walker (2 wheeled) Transfers: Sit to/from Stand Sit to Stand: Min guard;From elevated surface    General transfer comment: cues for bil hand placement, able to power up to standing with cues for bil quad/glue activation  Ambulation/Gait Ambulation/Gait assistance: Min guard Gait Distance (Feet): 150 Feet Assistive device: Rolling walker (2 wheeled) Gait Pattern/deviations: Step-through pattern;Decreased stride length Gait velocity: decreased   General Gait Details: pt ambulates with slow cadence, able to clear past obstacles and complete turns without LOB or unsteadiness, pain 2/10 with ambulation  Stairs            Wheelchair Mobility    Modified Rankin (Stroke Patients Only)       Balance Overall balance assessment: Needs assistance Sitting-balance support: Feet supported Sitting balance-Leahy Scale: Fair Sitting balance - Comments: seated EOB   Standing balance support: During functional activity;Bilateral upper extremity supported Standing balance-Leahy Scale: Poor Standing balance comment: reliant on UE support          Pertinent Vitals/Pain Pain Assessment: 0-10 Pain Score: 8  (2 in supine, 8 with supine<>sit, 2 with ambulation) Pain Location: abdomen Pain Descriptors / Indicators: Grimacing;Sore Pain Intervention(s): Limited activity within patient's tolerance;Monitored during session;Repositioned;Patient requesting pain meds-RN notified    Home Living Family/patient expects to be discharged to:: Private residence Living Arrangements: Alone Available Help at Discharge: Family;Available 24 hours/day Type of Home: House Home Access: Other (comment) (threshold into home)     Home Layout: One level Home Equipment: Shower seat - built in  Additional Comments: Pt's 2 daughters and 1  step son plan to stay with him 24/7 for the next ~2 weeks    Prior Function Level of Independence: Independent         Comments: Pt reports independent with ADLs, community ambulation, completes yardwork, drives.     Hand Dominance        Extremity/Trunk Assessment   Upper Extremity Assessment Upper Extremity Assessment: Overall WFL for tasks assessed    Lower Extremity Assessment Lower Extremity Assessment: Overall WFL for tasks assessed    Cervical / Trunk Assessment Cervical / Trunk Assessment: Normal  Communication   Communication: No difficulties  Cognition Arousal/Alertness: Awake/alert Behavior During Therapy: WFL for tasks assessed/performed Overall Cognitive Status: Within Functional Limits for tasks assessed       General Comments      Exercises     Assessment/Plan    PT Assessment Patient needs continued PT services  PT Problem List Decreased activity tolerance;Decreased balance;Decreased mobility;Decreased knowledge of use of DME;Pain;Obesity       PT Treatment Interventions DME instruction;Gait training;Functional mobility training;Therapeutic activities;Therapeutic exercise;Balance training;Patient/family education    PT Goals (Current goals can be found in the Care Plan section)  Acute Rehab PT Goals Patient Stated Goal: "home, no therapy" PT Goal Formulation: With patient Time For Goal Achievement: 11/11/20 Potential to Achieve Goals: Good    Frequency Min 3X/week   Barriers to discharge        Co-evaluation               AM-PAC PT "6 Clicks" Mobility  Outcome Measure Help needed turning from your back to your side while in a flat bed without using bedrails?: A Lot Help needed moving from lying on your back to sitting on the side of a flat bed without using bedrails?: A Lot Help needed moving to and from a bed to a chair (including a wheelchair)?: A Little Help needed standing up from a chair using your arms (e.g., wheelchair  or bedside chair)?: A Little Help needed to walk in hospital room?: A Little Help needed climbing 3-5 steps with a railing? : A Little 6 Click Score: 16    End of Session   Activity Tolerance: Patient tolerated treatment well;Patient limited by pain Patient left: in bed;with call bell/phone within reach;with family/visitor present Nurse Communication: Mobility status;Other (comment) (recliner- floor secretary called for one) PT Visit Diagnosis: Other abnormalities of gait and mobility (R26.89)    Time: 3903-0092 PT Time Calculation (min) (ACUTE ONLY): 32 min   Charges:   PT Evaluation $PT Eval Low Complexity: 1 Low PT Treatments $Therapeutic Activity: 8-22 mins         Tori Kassity Woodson PT, DPT 10/28/20, 1:14 PM

## 2020-10-28 NOTE — Anesthesia Postprocedure Evaluation (Signed)
Anesthesia Post Note  Patient: Zachary Shannon.  Procedure(s) Performed: XI ROBOTIC ASSISTED LAPAROSCOPIC COMPLETE CYSTECT ILEAL CONDUIT, RADICAL PROSTATECTOMY AND BILATERAL INGUINAL HERNIA REPAIR (N/A ) LYMPH NODE DISSECTION (Bilateral ) CYSTOSCOPY WITH INJECTION OF INDOCYANINE GREEN DYE (N/A )     Patient location during evaluation: PACU Anesthesia Type: General Level of consciousness: awake and alert, oriented and patient cooperative Pain management: pain level controlled Vital Signs Assessment: post-procedure vital signs reviewed and stable Respiratory status: spontaneous breathing, nonlabored ventilation and respiratory function stable Cardiovascular status: blood pressure returned to baseline and stable Postop Assessment: no apparent nausea or vomiting Anesthetic complications: no   No complications documented.  Last Vitals:  Vitals:   10/28/20 0200 10/28/20 0607  BP: (!) 139/100 123/81  Pulse: (!) 103 77  Resp:  18  Temp: 36.9 C 36.7 C  SpO2: 94% 94%    Last Pain:  Vitals:   10/28/20 0607  TempSrc: Oral  PainSc: [place]

## 2020-10-28 NOTE — Progress Notes (Signed)
1 Day Post-Op Subjective: Today "Zachary Shannon" is doing well. He walked x 2 overnight. Passing flatus. Reports some nausea yesterday that resolved; no emesis. Pain controlled. Urostomy appliance leaked overnight and changed x 2 by RN.   Great yellow UOP via urostomy. Moderate amount of SS JP output. Fairly stable Hgb postop. AKI to 1.67 from normal baseline.   Objective: Vital signs in last 24 hours: Temp:  [97.7 F (36.5 C)-98.5 F (36.9 C)] 98.1 F (36.7 C) (04/07 0607) Pulse Rate:  [77-126] 77 (04/07 0607) Resp:  [13-22] 18 (04/07 0607) BP: (123-152)/(80-112) 123/81 (04/07 0607) SpO2:  [92 %-99 %] 94 % (04/07 0607)  Intake/Output from previous day: 04/06 0701 - 04/07 0700 In: 4247.5 [P.O.:30; I.V.:3967.5; IV Piggyback:250] Out: 912 [Urine:140; Drains:322; Blood:450] Intake/Output this shift: Total I/O In: 667.5 [I.V.:517.5; IV Piggyback:150] Out: 180 [Drains:180]  Physical Exam:  General: Alert and oriented, laying in bed in NAD CV: Regular rate Lungs: NWOB on RA Abdomen: Soft, nondistended, appropriately tender. Incisions intact and covered with dermabond; no signs of infection. Urostomy slightly congested, health appearing, slightly sunken in; productive of yellow urine, bilateral bander stents (red - right, blue - left) in place Incisions: Ext: Warm and well-perfused   Lab Results: Recent Labs    10/26/20 1322 10/27/20 1557 10/28/20 0415  HGB 15.8 15.4 13.5  HCT 49.7 47.6 42.0   BMET Recent Labs    10/26/20 1322 10/28/20 0415  NA 139 139  K 4.4 5.2*  CL 106 107  CO2 24 24  GLUCOSE 106* 156*  BUN 19 18  CREATININE 1.13 1.67*  CALCIUM 9.1 8.2*     Studies/Results: No results found.  Assessment/Plan: 1 - BladderCancer- s/p robotic cystoprostatecotmy + ICG sentinal / template node dissection and ileal conduit diversion on 10/27/20.Path pending.Pre-admitted 4/5 for bowel prep, labs, and stomal marking. JP drain in place  2 - Bilateral Inguinal Hernias -  s/p robotic bilateral inguinal hernia repair on 10/27/20 at time of RC/IC.  3 - AROBF- NPO initially post-op and received entereg given bowel anastamoses. Advanced to sips/chips on 4/7.  4 - Disposition / Rehab- independent in ADL's at baseline. Ostomy RN team consultedduring hospitalizationfor initial stomal care teaching. PT consulted for post-operative evaluation; evaluation pending.   - Advance diet to sips/chips. Continue 100cc/hr mIVF [facility] IV tylenol x 24 hours then transition to PO. PRN oxycodone 5mg  Q4H, dilaudid 0.5-1mg  Q2H - PRN zofran - Continue JP drain. Will check JP drain Cr prior to discharge/removal - Continue daily labs - Start [facility] for DVT ppx - WOCN consult for new ostomy teaching - PT consulted - Bowel regimen, continue entereg - Hold home enalapril and hydralazine given AKI and normotensive - Hold home Eliquis given recent surgery  - IS, SCDs, encourage ambulation    LOS: 2 days   Celene Squibb 10/28/2020, 6:58 AM

## 2020-10-28 NOTE — Consult Note (Addendum)
[place] Nurse ostomy follow up Pt had illeal conduit surgery performed yesterday.  Stoma type/location: Stoma is red and viable, 1 1/2 inches, flush with skin level. 2 stints in place.  There is a slight crease which occurs at 9:00 o'clock Peristomal assessment: intact skin surrounding Output: mod amt cloudy yellow urine. Ostomy pouching: 1pc.  Education provided: Use Supplies: barrier ring, [place] # [id number] and convex pouch # [id number] Demonstrated pouch change to patient and daughter at the bedside, pt was able to remove and reattach the bedside drainage bag independently and open and close to empty.  Reviewed pouching routines and ordering supplies. Educational materials left at the bedside, along with 4 sets of barrier rings and convex pouches and a belt. Pt could benefit from home health assistance; please order if desired.  Enrolled patient in [place] Discharge program: Yes [place] team will perform another teaching session on Mon. Julien Girt MSN, RN, [place], [place], [place]

## 2020-10-28 NOTE — Plan of Care (Signed)
  Problem: Education: Goal: Knowledge of General Education information will improve Description: Including pain rating scale, medication(s)/side effects and non-pharmacologic comfort measures Outcome: Progressing   Problem: Clinical Measurements: Goal: Will remain free from infection Outcome: Progressing   Problem: Activity: Goal: Risk for activity intolerance will decrease Outcome: Progressing   Problem: Nutrition: Goal: Adequate nutrition will be maintained Outcome: Progressing   Problem: Pain Managment: Goal: General experience of comfort will improve Outcome: Progressing

## 2020-10-28 NOTE — Progress Notes (Signed)
Patient had difficult time attempting to walk at 2300, even after pain med administration.  Patient sat on side of bed for total of 20 minutes, stood for about 5 minutes and moved around for about 5 minutes.  When attempting to walk (with front wheel walker), patient's arms started to shake and he said he felt a little nauseous.  This resolved after sitting back down and resting.    Patient walked again at 0600.  Tolerated much better.  Walked into hallway twice, about 50 ft. Pain controlled well with IV tylenol this morning.  Will continue to monitor.  Candie Mile, RN

## 2020-10-28 NOTE — Plan of Care (Signed)
  Problem: Education: Goal: Knowledge of General Education information will improve Description: Including pain rating scale, medication(s)/side effects and non-pharmacologic comfort measures Outcome: Progressing   Problem: Clinical Measurements: Goal: Will remain free from infection Outcome: Progressing Goal: Respiratory complications will improve Outcome: Progressing   Problem: Elimination: Goal: Will not experience complications related to bowel motility Outcome: Progressing Goal: Will not experience complications related to urinary retention Outcome: Progressing

## 2020-10-29 LAB — BASIC METABOLIC PANEL
Anion gap: 9 (ref 5–15)
BUN: 13 mg/dL (ref 8–23)
CO2: 25 mmol/L (ref 22–32)
Calcium: 8.4 mg/dL — ABNORMAL LOW (ref 8.9–10.3)
Chloride: 107 mmol/L (ref 98–111)
Creatinine, Ser: 1.26 mg/dL — ABNORMAL HIGH (ref 0.61–1.24)
GFR, Estimated: >60 mL/min (ref >60)
Glucose, Bld: 93 mg/dL (ref 70–99)
Potassium: 4.7 mmol/L (ref 3.5–5.1)
Sodium: 141 mmol/L (ref 135–145)

## 2020-10-29 LAB — HEMOGLOBIN AND HEMATOCRIT, BLOOD
HCT: 43.2 % (ref 39.0–52.0)
Hemoglobin: 13.5 g/dL (ref 13.0–17.0)

## 2020-10-29 MED ORDER — DEXTROSE-NACL 5-0.45 % IV SOLN: INTRAVENOUS | Status: DC

## 2020-10-29 MED ORDER — APIXABAN 5 MG PO TABS
5.0000 mg | ORAL_TABLET | Freq: Two times a day (BID) | ORAL | Status: DC
  Administered 2020-10-30 – 2020-11-02 (×7): 5 mg via ORAL
  Filled 2020-10-29 (×7): qty 1

## 2020-10-29 NOTE — Plan of Care (Signed)
  Problem: Education: Goal: Knowledge of General Education information will improve Description: Including pain rating scale, medication(s)/side effects and non-pharmacologic comfort measures 10/29/2020 0314 by Candie Mile, RN Outcome: Progressing 10/29/2020 0314 by Candie Mile, RN Outcome: Progressing   Problem: Health Behavior/Discharge Planning: Goal: Ability to manage health-related needs will improve 10/29/2020 0314 by Candie Mile, RN Outcome: Progressing 10/29/2020 0314 by Candie Mile, RN Outcome: Progressing   Problem: Clinical Measurements: Goal: Ability to maintain clinical measurements within normal limits will improve Outcome: Progressing Goal: Will remain free from infection 10/29/2020 0314 by Candie Mile, RN Outcome: Progressing 10/29/2020 0314 by Candie Mile, RN Outcome: Progressing Goal: Diagnostic test results will improve Outcome: Progressing Goal: Respiratory complications will improve 10/29/2020 0314 by Candie Mile, RN Outcome: Progressing 10/29/2020 0314 by Candie Mile, RN Outcome: Progressing Goal: Cardiovascular complication will be avoided Outcome: Progressing   Problem: Activity: Goal: Risk for activity intolerance will decrease Outcome: Progressing   Problem: Nutrition: Goal: Adequate nutrition will be maintained Outcome: Progressing   Problem: Coping: Goal: Level of anxiety will decrease Outcome: Progressing   Problem: Elimination: Goal: Will not experience complications related to bowel motility Outcome: Progressing Goal: Will not experience complications related to urinary retention 10/29/2020 0314 by Candie Mile, RN Outcome: Progressing 10/29/2020 0314 by Candie Mile, RN Outcome: Progressing   Problem: Pain Managment: Goal: General experience of comfort will improve Outcome: Progressing   Problem: Safety: Goal: Ability to remain free from injury will improve Outcome: Progressing   Problem: Skin  Integrity: Goal: Risk for impaired skin integrity will decrease Outcome: Progressing

## 2020-10-29 NOTE — Care Management Important Message (Signed)
Medicare IM printed for Social Work staff to give to the patient 

## 2020-10-29 NOTE — Progress Notes (Signed)
2 Days Post-Op Subjective: Today "Zachary Shannon" is doing well. He walked x 4 yesterday. Worked with PT; no followed up PT recommended. Passing flatus yesterday morning but none since. Denies nausea/emesis with sips/chips. Pain controlled.   Great yellow UOP via urostomy. Moderate and increased amount of SS JP output. Stable Hgb postop. Cr downtrended to 1.26 today from 1.67 yesterday (normal baseline).  Objective: Vital signs in last 24 hours: Temp:  [97.9 F (36.6 C)-98.4 F (36.9 C)] 98.4 F (36.9 C) (04/08 3893) Pulse Rate:  [87-99] 99 (04/07 2046) Resp:  [16-18] 18 (04/07 2046) BP: (122-138)/(81-89) 122/84 (04/08 7342) SpO2:  [93 %-98 %] 93 % (04/08 8768)  Intake/Output from previous day: 04/07 0701 - 04/08 0700 In: 2003.5 [P.O.:240; I.V.:1551.3; IV Piggyback:212.2] Out: 1157 [Urine:1800; Drains:630] Intake/Output this shift: No intake/output data recorded.  Physical Exam:  General: Alert and oriented, laying in bed in NAD CV: Regular rate Lungs: NWOB on RA Abdomen: Soft, nondistended, appropriately tender. Incisions intact and covered with dermabond; no signs of infection. Urostomy slightly congested, health appearing, slightly sunken in; productive of yellow urine, bilateral bander stents (red - right, blue - left) in place Incisions: Ext: Warm and well-perfused   Lab Results: Recent Labs    10/27/20 1557 10/28/20 0415 10/29/20 0507  HGB 15.4 13.5 13.5  HCT 47.6 42.0 43.2   BMET Recent Labs    10/28/20 0415 10/29/20 0507  NA 139 141  K 5.2* 4.7  CL 107 107  CO2 24 25  GLUCOSE 156* 93  BUN 18 13  CREATININE 1.67* 1.26*  CALCIUM 8.2* 8.4*     Studies/Results: No results found.  Assessment/Plan: 1 - BladderCancer- s/p robotic cystoprostatecotmy + ICG sentinal / template node dissection and ileal conduit diversion on 10/27/20.Path pending.Pre-admitted 4/5 for bowel prep, labs, and stomal marking. JP drain in place; trending output   2 - Bilateral Inguinal  Hernias - s/p robotic bilateral inguinal hernia repair on 10/27/20 at time of RC/IC.  3 - AROBF- NPO initially post-op and received entereg given bowel anastamoses. Advanced to sips/chips on 4/7 and CLD on 4/8.  4 - Disposition / Rehab- independent in ADL's at baseline. Ostomy RN team consultedduring hospitalizationfor initial stomal care teaching. PT consulted for post-operative evaluation and recommended no follow up PT.   - Advance diet to CLD. Decrease fluids to 75cc/hr mIVF - [place] PO tylenol. PRN oxycodone 5mg  Q4H, dilaudid 0.5-1mg  Q2H - PRN zofran - Continue JP drain and trend output. Will check JP drain Cr prior to discharge/removal - Continue daily labs - Continue SQH for DVT ppx - WOCN consult for new ostomy teaching - PT consult - Bowel regimen, continue entereg until BM - Hold home enalapril and hydralazine given AKI and normotensive - Hold home Eliquis given recent surgery  - IS, SCDs, encourage ambulation    LOS: 3 days   Celene Squibb 10/29/2020, 7:14 AM

## 2020-10-29 NOTE — Plan of Care (Signed)
  Problem: Education: Goal: Knowledge of General Education information will improve Description: Including pain rating scale, medication(s)/side effects and non-pharmacologic comfort measures Outcome: Progressing   Problem: Clinical Measurements: Goal: Will remain free from infection Outcome: Progressing Goal: Diagnostic test results will improve Outcome: Progressing   Problem: Activity: Goal: Risk for activity intolerance will decrease Outcome: Progressing   Problem: Nutrition: Goal: Adequate nutrition will be maintained Outcome: Progressing   Problem: Elimination: Goal: Will not experience complications related to bowel motility Outcome: Progressing Goal: Will not experience complications related to urinary retention Outcome: Progressing   Problem: Pain Managment: Goal: General experience of comfort will improve Outcome: Progressing   Problem: Safety: Goal: Ability to remain free from injury will improve Outcome: Progressing   Problem: Skin Integrity: Goal: Risk for impaired skin integrity will decrease Outcome: Progressing

## 2020-10-30 LAB — BASIC METABOLIC PANEL
Anion gap: 8 (ref 5–15)
BUN: 9 mg/dL (ref 8–23)
CO2: 25 mmol/L (ref 22–32)
Calcium: 8.1 mg/dL — ABNORMAL LOW (ref 8.9–10.3)
Chloride: 107 mmol/L (ref 98–111)
Creatinine, Ser: 1.04 mg/dL (ref 0.61–1.24)
GFR, Estimated: >60 mL/min (ref >60)
Glucose, Bld: 92 mg/dL (ref 70–99)
Potassium: 3.5 mmol/L (ref 3.5–5.1)
Sodium: 140 mmol/L (ref 135–145)

## 2020-10-30 LAB — HEMOGLOBIN AND HEMATOCRIT, BLOOD
HCT: 42.4 % (ref 39.0–52.0)
Hemoglobin: 13.8 g/dL (ref 13.0–17.0)

## 2020-10-30 MED ORDER — APIXABAN 5 MG PO TABS: 5.0000 mg | ORAL_TABLET | Freq: Two times a day (BID) | ORAL | Status: DC

## 2020-10-30 NOTE — Progress Notes (Addendum)
Urology Inpatient Progress Report   Intv/Subj: +Flatus and ambulation.  Pain controlled.  Tolerating clears but not hungry for regular diet yet. Hgb stable.   Active Problems:   Bladder cancer ([place])  Current Facility-Administered Medications  Medication Dose Route Frequency Provider Last Rate Last Admin  . acetaminophen (TYLENOL) tablet 1,000 mg  1,000 mg Oral Once Nunzio Cobbs M, DO      . acetaminophen (TYLENOL) tablet 1,000 mg  1,000 mg Oral Q6H Celene Squibb, MD   1,000 mg at 10/30/20 0509  . alvimopan (ENTEREG) capsule 12 mg  12 mg Oral BID Debbrah Alar, PA-C   12 mg at 10/29/20 2038  . apixaban (ELIQUIS) tablet 5 mg  5 mg Oral BID Celene Squibb, MD      . atorvastatin (LIPITOR) tablet 10 mg  10 mg Oral QHS Debbrah Alar, PA-C   10 mg at 10/29/20 2037  . Chlorhexidine Gluconate Cloth 2 % PADS 6 each  6 each Topical Daily Alexis Frock, MD   6 each at 10/29/20 [phone]  . dextrose 5 %-0.45 % sodium chloride infusion   Intravenous Continuous Celene Squibb, MD 75 mL/hr at 10/29/20 2151 New Bag at 10/29/20 2151  . diltiazem (CARDIZEM CD) 24 hr capsule 240 mg  240 mg Oral Daily Debbrah Alar, PA-C   240 mg at 10/29/20 0820  . diphenhydrAMINE (BENADRYL) injection 12.5 mg  12.5 mg Intravenous Q6H PRN Dancy, Amanda, PA-C       Or  . diphenhydrAMINE (BENADRYL) 12.5 MG/5ML elixir 12.5 mg  12.5 mg Oral Q6H PRN Debbrah Alar, PA-C      . HYDROmorphone (DILAUDID) injection 0.5-1 mg  0.5-1 mg Intravenous Q2H PRN Debbrah Alar, PA-C   1 mg at 10/27/20 2329  . lactated ringers infusion   Intravenous Continuous Pervis Hocking, DO 10 mL/hr at 10/27/20 4580 New Bag at 10/27/20 0814  . metoprolol tartrate (LOPRESSOR) tablet 25 mg  25 mg Oral BID Debbrah Alar, PA-C   25 mg at 10/29/20 2037  . ondansetron (ZOFRAN) injection 4 mg  4 mg Intravenous Q4H PRN Dancy, Amanda, PA-C      . oxyCODONE (Oxy IR/ROXICODONE) immediate release tablet 5 mg  5 mg Oral Q4H PRN Debbrah Alar, PA-C   5  mg at 10/27/20 2206  . senna-docusate (Senokot-S) tablet 2 tablet  2 tablet Oral QHS Debbrah Alar, PA-C   2 tablet at 10/29/20 2038   Facility-Administered Medications Ordered in Other Encounters  Medication Dose Route Frequency Provider Last Rate Last Admin  . gemcitabine (GEMZAR) chemo syringe for bladder instillation 2,000 mg  2,000 mg Bladder Instillation Once Winter, Christopher Aaron, MD         Objective: Vital: Vitals:   10/29/20 1138 10/29/20 1332 10/29/20 2022 10/30/20 0547  BP:  118/76 134/86 107/82  Pulse:  80 78 67  Resp:  17 20 20   Temp:  97.9 F (36.6 C) 97.9 F (36.6 C) 97.6 F (36.4 C)  TempSrc:   Oral Oral  SpO2: 92% 94% 94% 93%  Weight:      Height:       I/Os: I/O last 3 completed shifts: In: 3623.4 [P.O.:960; I.V.:2451.2; IV Piggyback:212.2] Out: 9983 [Urine:3225; Drains:860]  Physical Exam:  General: Alert and oriented, laying in bed in NAD CV: Regular rate Lungs: NWOB on RA Abdomen: Soft, nondistended, appropriately tender. Incisions intact and covered with dermabond; no signs of infection. Urostomy  health appearing, productive of yellow urine, bilateral bander stents (red - right, blue - left) in  place Ext: Warm and well-perfused   Lab Results: Recent Labs    10/28/20 0415 10/29/20 0507 10/30/20 0437  HGB 13.5 13.5 13.8  HCT 42.0 43.2 42.4   Recent Labs    10/28/20 0415 10/29/20 0507 10/30/20 0437  NA 139 141 140  K 5.2* 4.7 3.5  CL 107 107 107  CO2 24 25 25   GLUCOSE 156* 93 92  BUN 18 13 9   CREATININE 1.67* 1.26* 1.04  CALCIUM 8.2* 8.4* 8.1*   No results for input(s): LABPT, INR in the last 72 hours. No results for input(s): LABURIN in the last 72 hours. Results for orders placed or performed during the hospital encounter of 10/26/20  SARS CORONAVIRUS 2 (TAT 6-24 HRS) Nasopharyngeal Nasopharyngeal Swab     Status: None   Collection Time: 10/26/20  2:24 PM   Specimen: Nasopharyngeal Swab  Result Value Ref Range Status   SARS  Coronavirus 2 NEGATIVE NEGATIVE Final    Comment: (NOTE) SARS-CoV-2 target nucleic acids are NOT DETECTED.  The SARS-CoV-2 RNA is generally detectable in upper and lower respiratory specimens during the acute phase of infection. Negative results do not preclude SARS-CoV-2 infection, do not rule out co-infections with other pathogens, and should not be used as the sole basis for treatment or other patient management decisions. Negative results must be combined with clinical observations, patient history, and epidemiological information. The expected result is Negative.  Fact Sheet for Patients: SugarRoll.be  Fact Sheet for Healthcare Providers: https://www.woods-mathews.com/  This test is not yet approved or cleared by the [place] FDA and  has been authorized for detection and/or diagnosis of SARS-CoV-2 by FDA under an Emergency Use Authorization (EUA). This EUA will remain  in effect (meaning this test can be used) for the duration of the COVID-19 declaration under Se ction 564(b)(1) of the Act, 21 U.S.C. section 360bbb-3(b)(1), unless the authorization is terminated or revoked sooner.  Performed at [facility], [place] [address] [place], [place] [address]   Surgical pcr screen     Status: None   Collection Time: 10/27/20  7:25 AM   Specimen: Nasal Mucosa; Nasal Swab  Result Value Ref Range Status   MRSA, PCR NEGATIVE NEGATIVE Final   Staphylococcus aureus NEGATIVE NEGATIVE Final    Comment: (NOTE) The Xpert SA Assay (FDA approved for NASAL specimens in patients 14 years of age and older), is one component of a comprehensive surveillance program. It is not intended to diagnose infection nor to guide or monitor treatment. Performed at [facility], [place] [address] [place], [place] [address]     Studies/Results: No results found.  Assessment: 1 - BladderCancer- POD3 s/p robotic cystoprostatecotmy +  ICG sentinal / template node dissection and ileal conduit diversion on 10/27/20.Path pending.Pre-admitted 4/5 for bowel prep, labs, and stomal marking. JP drain in place; trending output   2 - Bilateral Inguinal Hernias - s/p robotic bilateral inguinal hernia repair on 10/27/20 at time of RC/IC.  3 - AROBF- NPO initially post-op and received entereg given bowel anastamoses. Advanced to sips/chips on 4/7 and CLD on 4/8.  4 - Disposition / Rehab- independent in ADL's at baseline. Ostomy RN team consultedduring hospitalizationfor initial stomal care teaching. PT consulted for post-operative evaluation and recommended no follow up PT.   - continue clear liquids until BM or pt hungry - [place] PO tylenol. PRN oxycodone 5mg  Q4H, dilaudid 0.5-1mg  Q2H - PRN zofran - Continue JP drain and trend output. Will check JP drain Cr prior  to discharge/removal - Continue daily labs - Stop SQH for DVT ppx as eliquis retarting today - WOCN consult for new ostomy teaching - PT consult - Bowel regimen, continue entereg until BM - Hold home enalapril and hydralazine given AKI and normotensive - Hgb stable, restart Eliquis today - IS, SCDs, encourage ambulation  .       Jacalyn Lefevre, MD Urology 10/30/2020, 6:25 AM

## 2020-10-30 NOTE — Plan of Care (Signed)

## 2020-10-31 LAB — BASIC METABOLIC PANEL
Anion gap: 9 (ref 5–15)
BUN: 9 mg/dL (ref 8–23)
CO2: 27 mmol/L (ref 22–32)
Calcium: 8.1 mg/dL — ABNORMAL LOW (ref 8.9–10.3)
Chloride: 106 mmol/L (ref 98–111)
Creatinine, Ser: 1.03 mg/dL (ref 0.61–1.24)
GFR, Estimated: >60 mL/min (ref >60)
Glucose, Bld: 93 mg/dL (ref 70–99)
Potassium: 3.5 mmol/L (ref 3.5–5.1)
Sodium: 142 mmol/L (ref 135–145)

## 2020-10-31 LAB — HEMOGLOBIN AND HEMATOCRIT, BLOOD
HCT: 42.4 % (ref 39.0–52.0)
Hemoglobin: 13.6 g/dL (ref 13.0–17.0)

## 2020-10-31 LAB — CREATININE, FLUID (PLEURAL, PERITONEAL, JP DRAINAGE): Creat, Fluid: 0.9 mg/dL

## 2020-10-31 NOTE — Progress Notes (Signed)
Physical Therapy Treatment Patient Details Name: Zachary Shannon. MRN: [id number] DOB: 11/08/1950 Today's Date: 10/31/2020    History of Present Illness Pt is a 70 yo male s/p Robotic-assisted laparoscopic radical cystectomy, Radical prostatectomy,  bil lymph node dissection, cystoscopy, Bilateral inguinal hernia repair. PMH: afib, DVT, morbid obesity, hip OA, bladder CA, CAD, HTN, cardiac cath 2009, TURP 12/21    PT Comments    Pt is making steady progress with mobility. Pt reported he just got settled and didn't want to go for a walk, but was easily persuaded to ambulate with me. Pt continues to rely on RW for support. Pt had no LOB ambulating in the halls. Pt continues to have increased pain with bed mobility and would benefit from continued education on proper technique to increase independence and decrease pain. Pt will benefit from continued acute skilled PT to maximize mobility and independence for d/c home.   Follow Up Recommendations  No PT follow up;Supervision/Assistance - 24 hour     Equipment Recommendations  Rolling walker with 5" wheels    Recommendations for Other Services       Precautions / Restrictions Precautions Precautions: Fall Precaution Comments: ostomy, JP drain Restrictions Weight Bearing Restrictions: No    Mobility  Bed Mobility Overal bed mobility: Needs Assistance Bed Mobility: Supine to Sit     Supine to sit: Min assist     General bed mobility comments: increased abd pain with bed mobility, cues for technique, use of bed rails.    Transfers Overall transfer level: Needs assistance Equipment used: Rolling walker (2 wheeled) Transfers: Sit to/from Stand Sit to Stand: Min guard;From elevated surface         General transfer comment: cues for hand placement  Ambulation/Gait Ambulation/Gait assistance: Min guard Gait Distance (Feet): 300 Feet Assistive device: Rolling walker (2 wheeled) Gait Pattern/deviations: Step-through  pattern;Decreased stride length         Stairs             Wheelchair Mobility    Modified Rankin (Stroke Patients Only)       Balance Overall balance assessment: Needs assistance Sitting-balance support: Feet supported Sitting balance-Leahy Scale: Good     Standing balance support: During functional activity;Bilateral upper extremity supported Standing balance-Leahy Scale: Fair                              Cognition Arousal/Alertness: Awake/alert Behavior During Therapy: WFL for tasks assessed/performed Overall Cognitive Status: Within Functional Limits for tasks assessed                                        Exercises      General Comments General comments (skin integrity, edema, etc.): Pt fatigued, but relucantly agreed to ambulate with therapy.      Pertinent Vitals/Pain Pain Assessment: 0-10 Pain Score: 3  Pain Location: abdomen Pain Descriptors / Indicators: Guarding Pain Intervention(s): Limited activity within patient's tolerance;Monitored during session    Home Living                      Prior Function            PT Goals (current goals can now be found in the care plan section) Progress towards PT goals: Progressing toward goals    Frequency    Min 3X/week  PT Plan Current plan remains appropriate    Co-evaluation              AM-PAC PT "6 Clicks" Mobility   Outcome Measure  Help needed turning from your back to your side while in a flat bed without using bedrails?: A Little Help needed moving from lying on your back to sitting on the side of a flat bed without using bedrails?: A Little Help needed moving to and from a bed to a chair (including a wheelchair)?: A Little Help needed standing up from a chair using your arms (e.g., wheelchair or bedside chair)?: A Little Help needed to walk in hospital room?: A Little Help needed climbing 3-5 steps with a railing? : A Little 6  Click Score: 18    End of Session Equipment Utilized During Treatment: Gait belt Activity Tolerance: Patient tolerated treatment well;Patient limited by pain Patient left: in chair;with call bell/phone within reach Nurse Communication: Mobility status PT Visit Diagnosis: Other abnormalities of gait and mobility (R26.89)     Time: 1540-0867 PT Time Calculation (min) (ACUTE ONLY): 21 min  Charges:  $Gait Training: 8-22 mins                       Lelon Mast 10/31/2020, 12:43 PM

## 2020-10-31 NOTE — Progress Notes (Signed)
Urology Inpatient Progress Report    Intv/Subj: +Flatus and ambulation.  Pain controlled.  Tolerating regular diet last night.  No BM yet.   Active Problems:   Bladder cancer ([place])  Current Facility-Administered Medications  Medication Dose Route Frequency Provider Last Rate Last Admin  . acetaminophen (TYLENOL) tablet 1,000 mg  1,000 mg Oral Once Nunzio Cobbs M, DO      . acetaminophen (TYLENOL) tablet 1,000 mg  1,000 mg Oral Q6H Celene Squibb, MD   1,000 mg at 10/31/20 0521  . alvimopan (ENTEREG) capsule 12 mg  12 mg Oral BID Debbrah Alar, PA-C   12 mg at 10/30/20 2107  . apixaban (ELIQUIS) tablet 5 mg  5 mg Oral BID Celene Squibb, MD   5 mg at 10/30/20 2107  . atorvastatin (LIPITOR) tablet 10 mg  10 mg Oral QHS Debbrah Alar, PA-C   10 mg at 10/30/20 2107  . Chlorhexidine Gluconate Cloth 2 % PADS 6 each  6 each Topical Daily Alexis Frock, MD   6 each at 10/30/20 1031  . dextrose 5 %-0.45 % sodium chloride infusion   Intravenous Continuous Celene Squibb, MD 75 mL/hr at 10/30/20 2130 New Bag at 10/30/20 2130  . diltiazem (CARDIZEM CD) 24 hr capsule 240 mg  240 mg Oral Daily Debbrah Alar, PA-C   240 mg at 10/30/20 1029  . diphenhydrAMINE (BENADRYL) injection 12.5 mg  12.5 mg Intravenous Q6H PRN Dancy, Amanda, PA-C       Or  . diphenhydrAMINE (BENADRYL) 12.5 MG/5ML elixir 12.5 mg  12.5 mg Oral Q6H PRN Debbrah Alar, PA-C      . HYDROmorphone (DILAUDID) injection 0.5-1 mg  0.5-1 mg Intravenous Q2H PRN Debbrah Alar, PA-C   1 mg at 10/27/20 2329  . lactated ringers infusion   Intravenous Continuous Pervis Hocking, DO 10 mL/hr at 10/27/20 3875 New Bag at 10/27/20 0814  . metoprolol tartrate (LOPRESSOR) tablet 25 mg  25 mg Oral BID Debbrah Alar, PA-C   25 mg at 10/30/20 2107  . ondansetron (ZOFRAN) injection 4 mg  4 mg Intravenous Q4H PRN Dancy, Amanda, PA-C      . oxyCODONE (Oxy IR/ROXICODONE) immediate release tablet 5 mg  5 mg Oral Q4H PRN Debbrah Alar, PA-C    5 mg at 10/27/20 2206  . senna-docusate (Senokot-S) tablet 2 tablet  2 tablet Oral QHS Debbrah Alar, PA-C   2 tablet at 10/30/20 2106   Facility-Administered Medications Ordered in Other Encounters  Medication Dose Route Frequency Provider Last Rate Last Admin  . gemcitabine (GEMZAR) chemo syringe for bladder instillation 2,000 mg  2,000 mg Bladder Instillation Once Winter, Christopher Aaron, MD         Objective: Vital: Vitals:   10/30/20 0547 10/30/20 1338 10/30/20 2044 10/31/20 0524  BP: 107/82 (!) 123/92 122/78 137/87  Pulse: 67 75 72 63  Resp: 20 16 20 20   Temp: 97.6 F (36.4 C) (!) 97.2 F (36.2 C) 99.6 F (37.6 C) 98.2 F (36.8 C)  TempSrc: Oral Oral Oral   SpO2: 93% 95% 96% 97%  Weight:      Height:       I/Os: I/O last 3 completed shifts: In: 4958.8 [P.O.:2040; I.V.:2918.8] Out: 6433 [Urine:3500; Drains:650]  Physical Exam:  General: Alert and oriented, laying in bed in NAD CV: Regular rate Lungs: NWOB on RA Abdomen: Soft, nondistended, appropriately tender. Incisions intact and covered with dermabond; no signs of infection. Urostomy  health appearing, productive of yellow urine, bilateral bander stents (red -  right, blue - left) in place Ext: Warm and well-perfused   Lab Results: Recent Labs    10/29/20 0507 10/30/20 0437  HGB 13.5 13.8  HCT 43.2 42.4   Recent Labs    10/29/20 0507 10/30/20 0437  NA 141 140  K 4.7 3.5  CL 107 107  CO2 25 25  GLUCOSE 93 92  BUN 13 9  CREATININE 1.26* 1.04  CALCIUM 8.4* 8.1*   No results for input(s): LABPT, INR in the last 72 hours. No results for input(s): LABURIN in the last 72 hours. Results for orders placed or performed during the hospital encounter of 10/26/20  SARS CORONAVIRUS 2 (TAT 6-24 HRS) Nasopharyngeal Nasopharyngeal Swab     Status: None   Collection Time: 10/26/20  2:24 PM   Specimen: Nasopharyngeal Swab  Result Value Ref Range Status   SARS Coronavirus 2 NEGATIVE NEGATIVE Final    Comment:  (NOTE) SARS-CoV-2 target nucleic acids are NOT DETECTED.  The SARS-CoV-2 RNA is generally detectable in upper and lower respiratory specimens during the acute phase of infection. Negative results do not preclude SARS-CoV-2 infection, do not rule out co-infections with other pathogens, and should not be used as the sole basis for treatment or other patient management decisions. Negative results must be combined with clinical observations, patient history, and epidemiological information. The expected result is Negative.  Fact Sheet for Patients: SugarRoll.be  Fact Sheet for Healthcare Providers: https://www.woods-mathews.com/  This test is not yet approved or cleared by the [place] FDA and  has been authorized for detection and/or diagnosis of SARS-CoV-2 by FDA under an Emergency Use Authorization (EUA). This EUA will remain  in effect (meaning this test can be used) for the duration of the COVID-19 declaration under Se ction 564(b)(1) of the Act, 21 U.S.C. section 360bbb-3(b)(1), unless the authorization is terminated or revoked sooner.  Performed at [facility], [place] [address] [place], [place] [address]   Surgical pcr screen     Status: None   Collection Time: 10/27/20  7:25 AM   Specimen: Nasal Mucosa; Nasal Swab  Result Value Ref Range Status   MRSA, PCR NEGATIVE NEGATIVE Final   Staphylococcus aureus NEGATIVE NEGATIVE Final    Comment: (NOTE) The Xpert SA Assay (FDA approved for NASAL specimens in patients 57 years of age and older), is one component of a comprehensive surveillance program. It is not intended to diagnose infection nor to guide or monitor treatment. Performed at [facility], [place] [address], [place], [place] [address]     Studies/Results: No results found.  Assessment: 1 - BladderCancer- POD4 s/p robotic cystoprostatecotmy + ICG sentinal / template node dissection and ileal  conduit diversion on 10/27/20.Path pending.Pre-admitted 4/5 for bowel prep, labs, and stomal marking. JP drain in place; trending output  2 - Bilateral Inguinal Hernias - s/p robotic bilateral inguinal hernia repair on 10/27/20 at time of RC/IC.  3 - AROBF- NPO initially post-op and received entereg given bowel anastamoses. Advanced to sips/chips on 4/7and CLD on 4/8.  Patient diet advanced to regular on 10/30/20.  4 - Disposition / Rehab- independent in ADL's at baseline. Ostomy RN team consultedduring hospitalizationfor initial stomal care teaching. PT consulted for post-operative evaluationand recommended no follow up PT.   - pt diet advanced to regular last night and tolerating well, no BM yet - SCHPOtylenol. PRN oxycodone 5mg  Q4H, dilaudid 0.5-1mg  Q2H - PRN zofran - Continue JP drainand trend output. Will check JP drain Cr prior to discharge/removal -  Continue daily labs -Continue eliquis  - WOCN consult for new ostomy teaching - PT consult - Bowel regimen, continue entereguntil BM - Hold home enalapril and hydralazine given AKI and normotensive  - IS, SCDs, encourage ambulation .   Jacalyn Lefevre, MD Urology 10/31/2020, 6:06 AM

## 2020-10-31 NOTE — Plan of Care (Signed)
  Problem: Education: Goal: Knowledge of General Education information will improve Description: Including pain rating scale, medication(s)/side effects and non-pharmacologic comfort measures Outcome: Progressing   Problem: Clinical Measurements: Goal: Will remain free from infection Outcome: Progressing Goal: Diagnostic test results will improve Outcome: Progressing Goal: Respiratory complications will improve Outcome: Progressing   

## 2020-11-01 LAB — BASIC METABOLIC PANEL
Anion gap: 9 (ref 5–15)
BUN: 13 mg/dL (ref 8–23)
CO2: 24 mmol/L (ref 22–32)
Calcium: 8.8 mg/dL — ABNORMAL LOW (ref 8.9–10.3)
Chloride: 106 mmol/L (ref 98–111)
Creatinine, Ser: 1.09 mg/dL (ref 0.61–1.24)
GFR, Estimated: >60 mL/min (ref >60)
Glucose, Bld: 99 mg/dL (ref 70–99)
Potassium: 3.7 mmol/L (ref 3.5–5.1)
Sodium: 139 mmol/L (ref 135–145)

## 2020-11-01 LAB — HEMOGLOBIN AND HEMATOCRIT, BLOOD
HCT: 45.6 % (ref 39.0–52.0)
Hemoglobin: 14.8 g/dL (ref 13.0–17.0)

## 2020-11-01 MED ORDER — ENALAPRIL MALEATE 10 MG PO TABS
20.0000 mg | ORAL_TABLET | Freq: Two times a day (BID) | ORAL | Status: DC
  Administered 2020-11-01 – 2020-11-02 (×3): 20 mg via ORAL
  Filled 2020-11-01 (×3): qty 2

## 2020-11-01 MED ORDER — HYDRALAZINE HCL 25 MG PO TABS
25.0000 mg | ORAL_TABLET | Freq: Three times a day (TID) | ORAL | Status: DC
  Administered 2020-11-01 – 2020-11-02 (×4): 25 mg via ORAL
  Filled 2020-11-01 (×4): qty 1

## 2020-11-01 NOTE — Plan of Care (Signed)

## 2020-11-01 NOTE — TOC Initial Note (Signed)
Transition of Care (TOC) - Initial/Assessment Note    Patient Details  Name: Zachary Shannon. MRN: [id number] Date of Birth: 1950-10-06  Transition of Care [facility]) CM/SW Contact:    Joaquin Courts, RN Phone Number: 11/01/2020, 1:34 PM  Clinical Narrative:                 Alvis Lemmings to provide home health nurse.  [employer] to deliver rolling walker to bedside for home use.  Expected Discharge Plan: [place] Barriers to Discharge: No Barriers Identified   Patient Goals and CMS Choice Patient states their goals for this hospitalization and ongoing recovery are:: to go home with nurse CMS Medicare.gov Compare Post Acute Care list provided to:: Patient Choice offered to / list presented to : Patient  Expected Discharge Plan and Services Expected Discharge Plan: [place]   Discharge Planning Services: CM Consult Post Acute Care Choice: [place] arrangements for the past 2 months: Single Family Home                 DME Arranged: Walker rolling DME Agency: [employer] Date DME Agency Contacted: 11/01/20 Time DME Agency Contacted: [phone]   [place] Agency: [place] Date [place] Agency Contacted: 11/01/20 Time [place]: [phone] Representative spoke with at [place]: Tommi Rumps  Prior Living Arrangements/Services Living arrangements for the past 2 months: [place]   Patient language and need for interpreter reviewed:: Yes Do you feel safe going back to the place where you live?: Yes            Criminal Activity/Legal Involvement Pertinent to Current Situation/Hospitalization: No - Comment as needed  Activities of Daily Living Home Assistive Devices/Equipment: Eyeglasses ADL Screening (condition at time of admission) Patient's cognitive ability adequate to safely complete daily activities?: Yes Is the patient deaf or have difficulty hearing?: No Does the patient have difficulty seeing, even  when wearing glasses/contacts?: No Does the patient have difficulty concentrating, remembering, or making decisions?: No Patient able to express need for assistance with ADLs?: Yes Does the patient have difficulty dressing or bathing?: No Independently performs ADLs?: Yes (appropriate for developmental age) Does the patient have difficulty walking or climbing stairs?: No Weakness of Legs: None Weakness of Arms/Hands: None  Permission Sought/Granted                  Emotional Assessment Appearance:: Appears stated age Attitude/Demeanor/Rapport: Engaged Affect (typically observed): Accepting Orientation: : Oriented to Self,Oriented to Place,Oriented to  Time,Oriented to Situation   Psych Involvement: No (comment)  Admission diagnosis:  Bladder cancer [facility]) [C67.9] Patient Active Problem List   Diagnosis Date Noted  . Bladder cancer ([place]) 10/26/2020   PCP:  Minna Antis, DO Pharmacy:   [id number] - [place], [place] [address] Phone: [phone] Fax: [phone]     Social Determinants of Health (SDOH) Interventions    Readmission Risk Interventions No flowsheet data found.

## 2020-11-01 NOTE — Consult Note (Signed)
[place] Nurse ostomy follow up Stoma type/location: RMQ ileal conduit Stomal assessment/size: 1 and 3/8 inch oval with 2 stents intact Peristomal assessment: intact Treatment options for stomal/peristomal skin: skin barrier ring Output: blood tinged urine Ostomy pouching: 1pc.convex with skin barrier ring  Education provided: Patient has not been disconnecting bedside drainage bag from pouch over weekend.  We practice this today and he will leave himself disconnected as he ambulates today, only connecting when back in bed. He understands that the goal of disconnecting is to begin to learn how the pouch feels when it is filling up. Patient's questions about showing with pouch on (and off, when stents are out) are answered.  Also whether he can tolerate going to a ball game with his buddies.  I referred him to Dr. Tresa Moore for questions regarding progressive activity. Patient's pouch change: he observes the cutting of the pouch and is able to relay both the purpose and the procedure for placing the skin barrier ring on the back of the ostomy skin barrier. Patient is taught to take a spare ostomy pouch to his MD appointments as they do not have pouches in the office.   He had his daughter take the educational materials and spare pouches assembled by my partner last week home. Patient to have [facility] after discharge for continued ostomy teaching and support.  Enrolled patient in [place] Start Discharge program: Yes  [place] nursing team will follow, and will remain available to this patient, the nursing and medical teams.   Thanks, Maudie Flakes, MSN, RN, [place], Arther Abbott  Pager# [phone]

## 2020-11-01 NOTE — Progress Notes (Signed)
5 Days Post-Op Subjective: Today "Zachary Shannon" is doing well. Tolerating regular diet without nausea/emesis. Passing flatus and had BM x 2. Ambulating. Pain controlled.   Good amber UOP via urostomy. Moderate and increased amount of SS JP output; JP drain Cr negative for leak yesterday. Stable Hgb. Cr normal.   Objective: Vital signs in last 24 hours: Temp:  [97.5 F (36.4 C)-98 F (36.7 C)] 97.5 F (36.4 C) (04/11 0504) Pulse Rate:  [55-99] 82 (04/11 0504) Resp:  [16-20] 20 (04/11 0504) BP: (112-142)/(73-97) 142/90 (04/11 0504) SpO2:  [96 %-98 %] 98 % (04/11 0504)  Intake/Output from previous day: 04/10 0701 - 04/11 0700 In: 1639.8 [P.O.:720; I.V.:919.8] Out: 1525 [Urine:975; Drains:550] Intake/Output this shift: No intake/output data recorded.  Physical Exam:  General: Alert and oriented, laying in bed in NAD CV: Regular rate Lungs: NWOB on RA Abdomen: Soft, nondistended, appropriately tender. Incisions intact and covered with dermabond; mild erythema at superior aspect of midline incision like due to leaking of urostomy, no purulent drainage. Urostomy health appearing, slightly sunken in; productive of amber urine, bilateral bander stents (red - right, blue - left) in place; tegaderm over medial aspect of urostomy appliance due to leakage Ext: Warm and well-perfused   Lab Results: Recent Labs    10/30/20 0437 10/31/20 0504 11/01/20 0510  HGB 13.8 13.6 14.8  HCT 42.4 42.4 45.6   BMET Recent Labs    10/31/20 0504 11/01/20 0510  NA 142 139  K 3.5 3.7  CL 106 106  CO2 27 24  GLUCOSE 93 99  BUN 9 13  CREATININE 1.03 1.09  CALCIUM 8.1* 8.8*     Studies/Results: No results found.  Assessment/Plan: 1 - BladderCancer- s/p robotic cystoprostatecotmy + ICG sentinal / template node dissection and ileal conduit diversion on 10/27/20.Path pending.Pre-admitted 4/5 for bowel prep, labs, and stomal marking. JP drain in place; drain Cr negative on 4/10; trending output    2 - Bilateral Inguinal Hernias - s/p robotic bilateral inguinal hernia repair on 10/27/20 at time of RC/IC.  3 - AROBF- NPO initially post-op and received entereg given bowel anastamoses. Advanced to sips/chips on 4/7 and CLD on 4/8. He started passing flatus on 4/9 and was transitioned to a regular diet later that day. He had a BM on 4/10.   4 - Disposition / Rehab- independent in ADL's at baseline. Ostomy RN team consultedduring hospitalizationfor initial stomal care teaching. PT consulted for post-operative evaluation and recommended no follow up PT.   - Continue regular diet. Stop IVF - [place] PO tylenol. PRN oxycodone 5mg  Q4H. Stop IV diladudi  - Continue JP drain until discharge and trend output. JP drain Cr negative for leak on 4/10 - Continue daily labs - WOCN consult for new ostomy teaching - PT consult - Bowel regimen, stop entereg now that patient has had a BM - Resume home enalapril and hydralazine now that AKI resolved and SBP trending upward  - Continue home Eliquis  - IS, SCDs, encourage ambulation    LOS: 6 days   Celene Squibb 11/01/2020, 7:51 AM

## 2020-11-02 MED ORDER — CEPHALEXIN 500 MG PO CAPS: 500.0000 mg | ORAL_CAPSULE | Freq: Four times a day (QID) | ORAL | 0 refills | Status: DC

## 2020-11-02 MED ORDER — SULFAMETHOXAZOLE-TRIMETHOPRIM 800-160 MG PO TABS: 1.0000 | ORAL_TABLET | Freq: Two times a day (BID) | ORAL | 0 refills | Status: DC

## 2020-11-02 MED ORDER — OXYCODONE-ACETAMINOPHEN 5-325 MG PO TABS: 1.0000 | ORAL_TABLET | Freq: Four times a day (QID) | ORAL | 0 refills | Status: DC | PRN

## 2020-11-02 NOTE — Discharge Summary (Addendum)
Date of admission: 10/26/2020  Date of discharge: 11/02/2020  Admission diagnosis: Bladder cancer  Discharge diagnosis: Bladder cancer  Secondary diagnoses: Bilateral inguinal hernia   History and Physical: For full details, please see admission history and physical. Briefly, Zachary Shannon. is a 70 y.o. 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 Staging CT 09/2019 localized, single ureters bilaterally.   2 - Prostate Screening - 2021 - PSA 0.54   3 - Bilateral Inguinal Hernias - R>L incidental hernias on CT 09/2019, right with some small bowel. NO h/o strangulation.   Hospital Course:   1 - BladderCancer- s/p robotic cystoprostatecotmy + ICG sentinal / template node dissection and ileal conduit diversion on 10/27/20.Path pending.Pre-admitted 4/5 for bowel prep, labs, and stomal marking. JP drain in place; drain Cr negative on 4/10. Drain removed prior to discharge.   2 - Bilateral Inguinal Hernias - s/p robotic bilateral inguinal hernia repair on 10/27/20 at time of RC/IC.  3 - AROBF- NPO initially post-op and received entereg given bowel anastamoses. Advanced to sips/chips on 4/7 and CLD on 4/8. He started passing flatus on 4/9 and was transitioned to a regular diet later that day. He had a BM on 4/10.   4 - Disposition / Rehab- independent in ADL's at baseline. Ostomy RN team consultedduring hospitalizationfor initial stomal care teaching. [name] RN arranged on discharge to help with ostomy at home. PT consulted for post-operative evaluation and recommended no follow up PT.   5 - Cellulitis - Erythema noted at superior aspect of midline incision without purulent drainage. No fevers or leukocytosis. Prescribed Bactrim DS BID x 7d.   Laboratory values: Recent Labs    10/31/20 0504 11/01/20 0510  HGB 13.6 14.8  HCT 42.4 45.6   Recent Labs     10/31/20 0504 11/01/20 0510  CREATININE 1.03 1.09    Physical Exam:  General: Alert and oriented, laying in bed in NAD CV: Regular rate Lungs: NWOB on RA Abdomen: Soft, nondistended, appropriately tender. Incisions intact and covered with dermabond; mild erythema at superior aspect of midline incision, no purulent drainage. Urostomy health appearing, slightly sunken in; productive of amber urine, bilateral bander stents (red - right, blue - left) in place Ext: Warm and well-perfused   Disposition: Home  Discharge instruction: The patient was instructed to be ambulatory but told to refrain from heavy lifting and strenuous activity.  Discharge medications:  Allergies as of 11/02/2020   No Known Allergies     Medication List    TAKE these medications   apixaban 5 MG Tabs tablet Commonly known as: ELIQUIS Take 5 mg by mouth 2 (two) times daily.   atorvastatin 10 MG tablet Commonly known as: LIPITOR Take 10 mg by mouth at bedtime.   cephALEXin 500 MG capsule Commonly known as: KEFLEX Take 1 capsule (500 mg total) by mouth 4 (four) times daily for 7 days.   diltiazem 240 MG 24 hr capsule Commonly known as: DILACOR XR Take 240 mg by mouth daily.   enalapril 20 MG tablet Commonly known as: VASOTEC Take 20 mg by mouth 2 (two) times daily.   hydrALAZINE 25 MG tablet Commonly known as: APRESOLINE Take 25 mg by mouth 3 (three) times daily.   metoprolol tartrate 50 MG tablet Commonly known as: LOPRESSOR Take 25 mg by mouth 2 (two) times daily.  Durable Medical Equipment  (From admission, onward)         Start     Ordered   11/01/20 0554  For home use only DME Walker rolling  Once       Question Answer Comment  Walker: With 5 Inch Wheels   Patient needs a walker to treat with the following condition Bladder cancer ([place])      11/01/20 0553          Followup:   Follow-up Information    Alexis Frock, MD On 11/22/2020.   Specialty: Urology Why:  at 12:45 Contact information: [place] [address] [phone]        [facility] Follow up.   Specialty: Home Health Services Why: agency will provide home health nurse. Contact information: [place] [address] [phone]

## 2020-11-02 NOTE — TOC Transition Note (Signed)
Transition of Care [facility]) - CM/SW Discharge Note   Patient Details  Name: Zachary Shannon. MRN: [id number] Date of Birth: [birth year]  [facility]) CM/SW Contact:  Joaquin Courts, RN Phone Number: 11/02/2020, 11:29 AM   Clinical Narrative:    CM received notification from [facility] rep, who states [facility] cannot service in the [place] area, patient lives [place].  CM gave referral to [facility] rep cheryl, whop states [place] office will service.   Final next level of care: [place] Barriers to Discharge: No Barriers Identified   Patient Goals and CMS Choice Patient states their goals for this hospitalization and ongoing recovery are:: to go home with nurse CMS Medicare.gov Compare Post Acute Care list provided to:: Patient Choice offered to / list presented to : Patient  Discharge Placement                       Discharge Plan and Services   Discharge Planning Services: CM Consult Post Acute Care Choice: Home Health          DME Arranged: [place] rolling DME Agency: [employer] Date DME Agency Contacted: 11/01/20 Time DME Agency Contacted: [id number]   [place] Arranged: RN [place] Agency: [facility] Date HH Agency Contacted: 11/02/20 Time [place]: 1129 Representative spoke with at [place]: Guadalupe Guerra ([place]) Interventions     Readmission Risk Interventions No flowsheet data found.

## 2020-11-02 NOTE — Progress Notes (Signed)
Discharge instructions reviewed with pt. All questions answered. Pt verbalizes understanding of education. PRN pain medication given prior to JP removal. Pt waiting for ride to arrive.

## 2020-11-05 LAB — SURGICAL PATHOLOGY

## 2020-11-09 ENCOUNTER — Emergency Department (HOSPITAL_COMMUNITY): Payer: Medicare Other

## 2020-11-09 ENCOUNTER — Encounter (HOSPITAL_COMMUNITY): Payer: Self-pay

## 2020-11-09 ENCOUNTER — Other Ambulatory Visit: Payer: Self-pay

## 2020-11-09 ENCOUNTER — Emergency Department (HOSPITAL_COMMUNITY)
Admission: EM | Admit: 2020-11-09 | Discharge: 2020-11-10 | Disposition: A | Discharge status: Home / Self Care | Payer: Medicare Other | Attending: Emergency Medicine | Admitting: Emergency Medicine

## 2020-11-09 DIAGNOSIS — Z87891 Personal history of nicotine dependence: Secondary | ICD-10-CM | POA: Insufficient documentation

## 2020-11-09 DIAGNOSIS — R7989 Other specified abnormal findings of blood chemistry: Secondary | ICD-10-CM | POA: Insufficient documentation

## 2020-11-09 DIAGNOSIS — Z8551 Personal history of malignant neoplasm of bladder: Secondary | ICD-10-CM | POA: Diagnosis not present

## 2020-11-09 DIAGNOSIS — R310 Gross hematuria: Secondary | ICD-10-CM | POA: Diagnosis present

## 2020-11-09 DIAGNOSIS — Z79899 Other long term (current) drug therapy: Secondary | ICD-10-CM | POA: Diagnosis not present

## 2020-11-09 DIAGNOSIS — I2510 Atherosclerotic heart disease of native coronary artery without angina pectoris: Secondary | ICD-10-CM | POA: Insufficient documentation

## 2020-11-09 DIAGNOSIS — Z7901 Long term (current) use of anticoagulants: Secondary | ICD-10-CM | POA: Insufficient documentation

## 2020-11-09 DIAGNOSIS — D6832 Hemorrhagic disorder due to extrinsic circulating anticoagulants: Secondary | ICD-10-CM | POA: Insufficient documentation

## 2020-11-09 DIAGNOSIS — R109 Unspecified abdominal pain: Secondary | ICD-10-CM | POA: Diagnosis not present

## 2020-11-09 DIAGNOSIS — R319 Hematuria, unspecified: Secondary | ICD-10-CM

## 2020-11-09 DIAGNOSIS — I10 Essential (primary) hypertension: Secondary | ICD-10-CM | POA: Insufficient documentation

## 2020-11-09 DIAGNOSIS — D689 Coagulation defect, unspecified: Secondary | ICD-10-CM

## 2020-11-09 MED ORDER — SODIUM CHLORIDE 0.9 % IV BOLUS
500.0000 mL | Freq: Once | INTRAVENOUS | Status: AC
  Administered 2020-11-09: 500 mL via INTRAVENOUS

## 2020-11-09 NOTE — ED Provider Notes (Signed)
[facility] DEPT Provider Note   CSN: [id number] Arrival date & time: 11/09/20  2136     History Chief Complaint  Patient presents with  . Hematuria  . Flank Pain    Zachary Shannon. is a 70 y.o. male.  Patient to Ed with sudden onset right flank pain that woke him from sleep early this morning, and gross hematuria that developed throughout today. Recent cystoprostatectomy secondary to bladder CA on 4/6 [facility]) with bilateral inguinal hernia repair at that time as well. He reports uncomplicated post-op course until symptoms started today. No fever, nausea, vomiting, abdominal pain.   The history is provided by the patient. No language interpreter was used.  Hematuria Pertinent negatives include no abdominal pain.  Flank Pain Pertinent negatives include no abdominal pain.       Past Medical History:  Diagnosis Date  . Anticoagulant long-term use    eliquis--- managed by cardiology  . Bladder tumor   . Cancer ([place])    bladder  . Cardiomyopathy [facility])    dx 2009 per cardiac cath w/ ef 20-25%;   per cardiology note   . Coronary artery disease cardiologist--- dr Ronnell Freshwater  ([facility] [place] )   per cardiologist note dated 09-16-2019 , received via fax,  pt had cardiac cath 2009 done at [facility] that showed CTO of LAD with excellent collateralization from LCx and RCA, no PCI performed, sig. cardiomyopathy ef 20-25%;  per note stated improved to 45-50% (no documentation available in note and no echo)  . Dysrhythmia    Afib  . Hematuria   . Hyperlipidemia   . Hypertension    followed by pcp  . Nocturia   . PAF (paroxysmal atrial fibrillation) [facility])    cardiologist--- dr Ronnell Freshwater--- dx 2009    Patient Active Problem List   Diagnosis Date Noted  . Bladder cancer ([place]) 10/26/2020    Past Surgical History:  Procedure Laterality Date  . CARDIAC CATHETERIZATION  2009    @[facility]   per pt done due to dx AFib;  told no blockage's  . CATARACT EXTRACTION W/ INTRAOCULAR LENS  IMPLANT, BILATERAL  02/2019  . CYSTOSCOPY WITH INJECTION N/A 10/27/2020   Procedure: CYSTOSCOPY WITH INJECTION OF INDOCYANINE GREEN DYE;  Surgeon: Alexis Frock, MD;  [facility];  Service: Urology;  Laterality: N/A;  . LYMPH NODE DISSECTION Bilateral 10/27/2020   Procedure: LYMPH NODE DISSECTION;  Surgeon: Alexis Frock, MD;  [facility];  Service: Urology;  Laterality: Bilateral;  . ROBOT ASSISTED LAPAROSCOPIC COMPLETE CYSTECT ILEAL CONDUIT N/A 10/27/2020   Procedure: XI ROBOTIC ASSISTED LAPAROSCOPIC COMPLETE CYSTECT ILEAL CONDUIT, RADICAL PROSTATECTOMY AND BILATERAL INGUINAL HERNIA REPAIR;  Surgeon: Alexis Frock, MD;  [facility];  Service: Urology;  Laterality: N/A;  6 HRS  . TRANSURETHRAL RESECTION OF BLADDER TUMOR N/A 10/29/2019   Procedure: TRANSURETHRAL RESECTION OF BLADDER TUMOR  (TURBT), CYSTOSCOPY/ INTRAVESICAL INSTILLATION OF GEMCITABINE;  Surgeon: Ceasar Mons, MD;  Location: [facility];  Service: Urology;  Laterality: N/A;  . TRANSURETHRAL RESECTION OF BLADDER TUMOR N/A 07/14/2020   Procedure: TRANSURETHRAL RESECTION OF BLADDER TUMOR (TURBT) WITH CYSTOSCOPY/ GEMCITABINE INSTILLATION;  Surgeon: Ceasar Mons, MD;  [facility];  Service: Urology;  Laterality: N/A;       No family history on file.  Social History   Tobacco Use  . Smoking status: Former Smoker    Packs/day: 0.50    Years: 28.00    Pack  years: 14.00    Types: Cigarettes    Quit date: 10/08/2008    Years since quitting: 12.0  . Smokeless tobacco: Former Systems developer    Quit date: 10/08/2008  Vaping Use  . Vaping Use: Never used  Substance Use Topics  . Alcohol use: Never  . Drug use: Never    Home Medications Prior to Admission medications   Medication Sig Start Date End Date Taking? Authorizing Provider  apixaban (ELIQUIS) 5 MG TABS  tablet Take 5 mg by mouth 2 (two) times daily.    [provider]  atorvastatin (LIPITOR) 10 MG tablet Take 10 mg by mouth at bedtime.    [provider]  diltiazem (DILACOR XR) 240 MG 24 hr capsule Take 240 mg by mouth daily.    [provider]  enalapril (VASOTEC) 20 MG tablet Take 20 mg by mouth 2 (two) times daily.    [provider]  hydrALAZINE (APRESOLINE) 25 MG tablet Take 25 mg by mouth 3 (three) times daily.    [provider]  metoprolol tartrate (LOPRESSOR) 50 MG tablet Take 25 mg by mouth 2 (two) times daily.    [provider]  oxyCODONE-acetaminophen (PERCOCET) 5-325 MG tablet Take 1 tablet by mouth every 6 (six) hours as needed for moderate pain or severe pain. Post-operatively 11/02/20 11/02/21  Alexis Frock, MD  sulfamethoxazole-trimethoprim (BACTRIM DS) 800-160 MG tablet Take 1 tablet by mouth 2 (two) times daily for 7 days. 11/02/20 11/09/20  Celene Squibb, MD    Allergies    Patient has no known allergies.  Review of Systems   Review of Systems  Constitutional: Negative for chills and fever.  Respiratory: Negative.   Cardiovascular: Negative.   Gastrointestinal: Negative.  Negative for abdominal pain and nausea.  Genitourinary: Positive for flank pain and hematuria.  Skin: Negative.   Neurological: Negative.     Physical Exam Updated Vital Signs BP 111/72 (BP Location: Left Arm)   Pulse 65   Temp 97.7 F (36.5 C) (Oral)   Resp 17   Ht 6' (1.829 m)   Wt 116.1 kg   SpO2 98%   BMI 34.72 kg/m   Physical Exam Vitals and nursing note reviewed.  Constitutional:      General: He is not in acute distress.    Appearance: Normal appearance. He is obese. He is not ill-appearing.  HENT:     Head: Normocephalic.  Cardiovascular:     Rate and Rhythm: Normal rate.  Pulmonary:     Effort: Pulmonary effort is normal. No respiratory distress.  Abdominal:     Comments: Urostomy in RLQ with gross blood in  collection bag. Minimal surrounding erythema at incision sites. Abdomen nontender and soft. There is TTP right flank.  Musculoskeletal:        General: Normal range of motion.  Skin:    General: Skin is warm and dry.  Neurological:     General: No focal deficit present.     Mental Status: He is alert and oriented to person, place, and time.     ED Results / Procedures / Treatments   Labs (all labs ordered are listed, but only abnormal results are displayed) Labs Reviewed  COMPREHENSIVE METABOLIC PANEL  CBC WITH DIFFERENTIAL/PLATELET   Results for orders placed or performed during the hospital encounter of 11/09/20  Comprehensive metabolic panel  Result Value Ref Range   Sodium 137 135 - 145 mmol/L   Potassium 5.4 (H) 3.5 - 5.1 mmol/L   Chloride 108  98 - 111 mmol/L   CO2 21 (L) 22 - 32 mmol/L   Glucose, Bld 115 (H) 70 - 99 mg/dL   BUN 28 (H) 8 - 23 mg/dL   Creatinine, Ser 2.26 (H) 0.61 - 1.24 mg/dL   Calcium 8.7 (L) 8.9 - 10.3 mg/dL   Total Protein 7.2 6.5 - 8.1 g/dL   Albumin 3.4 (L) 3.5 - 5.0 g/dL   AST 23 15 - 41 U/L   ALT 20 0 - 44 U/L   Alkaline Phosphatase 107 38 - 126 U/L   Total Bilirubin 1.1 0.3 - 1.2 mg/dL   GFR, Estimated 30 (L) >60 mL/min   Anion gap 8 5 - 15  CBC with Differential  Result Value Ref Range   WBC 16.2 (H) 4.0 - 10.5 K/uL   RBC 4.29 4.22 - 5.81 MIL/uL   Hemoglobin 13.8 13.0 - 17.0 g/dL   HCT 43.3 39.0 - 52.0 %   MCV 100.9 (H) 80.0 - 100.0 fL   MCH 32.2 26.0 - 34.0 pg   MCHC 31.9 30.0 - 36.0 g/dL   RDW 15.1 11.5 - 15.5 %   Platelets 441 (H) 150 - 400 K/uL   nRBC 0.0 0.0 - 0.2 %   Neutrophils Relative % 88 %   Neutro Abs 14.2 (H) 1.7 - 7.7 K/uL   Lymphocytes Relative 5 %   Lymphs Abs 0.8 0.7 - 4.0 K/uL   Monocytes Relative 5 %   Monocytes Absolute 0.9 0.1 - 1.0 K/uL   Eosinophils Relative 0 %   Eosinophils Absolute 0.1 0.0 - 0.5 K/uL   Basophils Relative 1 %   Basophils Absolute 0.1 0.0 - 0.1 K/uL   Immature Granulocytes 1 %   Abs  Immature Granulocytes 0.20 (H) 0.00 - 0.07 K/uL    EKG None  Radiology No results found. CT ABDOMEN PELVIS WO CONTRAST  Result Date: 11/09/2020 CLINICAL DATA:  Hematuria EXAM: CT ABDOMEN AND PELVIS WITHOUT CONTRAST TECHNIQUE: Multidetector CT imaging of the abdomen and pelvis was performed following the standard protocol without IV contrast. COMPARISON:  CT 09/22/2019 FINDINGS: Lower chest: Lung bases demonstrate no acute consolidation or effusion. Linear scarring or atelectasis at the right base. Cardiomegaly with coronary vascular calcification. Small hiatal hernia. Hepatobiliary: Small gallstones. No focal hepatic abnormality or biliary dilatation Pancreas: Unremarkable. No pancreatic ductal dilatation or surrounding inflammatory changes. Spleen: Normal in size without focal abnormality. Adrenals/Urinary Tract: Adrenal glands are normal. Minimal bilateral hydronephrosis with ureteral stents in place. Patient is status post cysto prostatectomy and creation of right lower quadrant ileal conduit. Bilateral ureteral stents terminate in a right abdominal ileostomy. Ileostomy is nondistended. Stomach/Bowel: The stomach is nonenlarged. No dilated small bowel. Postsurgical changes of the small bowel within the anterior abdominal cavity. Sigmoid colon diverticular disease without acute inflammatory change. Transverse orientation of cecum. Midline negative appendix. Vascular/Lymphatic: Advanced aortic atherosclerosis. No aneurysm. No suspicious lymph nodes. Reproductive: Status post prostatectomy. Fluid in the right inguinal canal. Status post bilateral inguinal hernia repair. Gas within the inguinal canals. Other: Small free fluid within the pelvis. No free air. Moderate to large volume subcutaneous emphysema within the abdominal walls, extending into the bilateral thighs. Musculoskeletal: No acute or suspicious osseous abnormality. IMPRESSION: 1. Status post cysto prostatectomy and creation of right lower  quadrant ileal conduit and urostomy. Minimal bilateral hydronephrosis with placement of bilateral ureteral stents which terminate in right lower quadrant urostomy. Negative for stone disease. Status post bilateral inguinal hernia repair with fluid in the right inguinal canal  and small amount of gas within the inguinal canals. 2. Moderate to large volume of subcutaneous emphysema within the abdominal walls, extending into the bilateral thighs, presumably related to history of laparoscopic procedure 3. Sigmoid colon diverticular disease without acute inflammatory change. 4. Small gallstones. 5. Small free fluid within the pelvis likely postoperative 6. Aortic atherosclerosis. Aortic Atherosclerosis (ICD10-I70.0). Electronically Signed   By: Donavan Foil M.D.   On: 11/09/2020 23:58    Procedures Procedures   Medications Ordered in ED Medications  sodium chloride 0.9 % bolus 500 mL (has no administration in time range)    ED Course  I have reviewed the triage vital signs and the nursing notes.  Pertinent labs & imaging results that were available during my care of the patient were reviewed by me and considered in my medical decision making (see chart for details).    MDM Rules/Calculators/A&P                          Patient to ED with right flank pain and gross hematuria s/p cystoprostatecomy 4/6. No fever, vomiting.   The patient's urologist, Dr. Tresa Moore, is on call tonight. I spoke to him to coordinate patient's work up. Labs, CT ordered.   On results review, he is found to have an acute AKI, Cr going from 1.09 4/11 to 2.26 tonight. Leukocytosis of 16.2, mild hyperkalemia of 5.4. CT shows stents in place.   Will discuss results with Dr. Tresa Moore to determine appropriate disposition.   Discussed with Dr. Tresa Moore who reviewed the CT as well as all lab results. Advised that the patient can be followed up in the office per scheduled appointment times. Patient and family updated and all questions  answered.   Final Clinical Impression(s) / ED Diagnoses Final diagnoses:  None   1. Hematuria 2. Coagulopathy secondary to Eliquis 3. Elevated Creatinine 4. Right flank pain  Rx / DC Orders ED Discharge Orders    None       Dennie Bible 11/10/20 0214    Drenda Freeze, MD 11/10/20 [phone]

## 2020-11-09 NOTE — ED Notes (Signed)
Called Urology @22 :44pm for PA, Zachary Shannon

## 2020-11-09 NOTE — ED Triage Notes (Signed)
Pt reports having bladder removed, urostomy placed on right lower abdomen and 2 hernia repairs on 10/27/20. Has been recovering well until last night when he suddenly had intense right flank pain and hematuria appear in bag. Hx of bladder cancer and is not receiving treatment.

## 2020-11-10 LAB — COMPREHENSIVE METABOLIC PANEL
ALT: 20 U/L (ref 0–44)
AST: 23 U/L (ref 15–41)
Albumin: 3.4 g/dL — ABNORMAL LOW (ref 3.5–5.0)
Alkaline Phosphatase: 107 U/L (ref 38–126)
Anion gap: 8 (ref 5–15)
BUN: 28 mg/dL — ABNORMAL HIGH (ref 8–23)
CO2: 21 mmol/L — ABNORMAL LOW (ref 22–32)
Calcium: 8.7 mg/dL — ABNORMAL LOW (ref 8.9–10.3)
Chloride: 108 mmol/L (ref 98–111)
Creatinine, Ser: 2.26 mg/dL — ABNORMAL HIGH (ref 0.61–1.24)
GFR, Estimated: 30 mL/min — ABNORMAL LOW (ref >60)
Glucose, Bld: 115 mg/dL — ABNORMAL HIGH (ref 70–99)
Potassium: 5.4 mmol/L — ABNORMAL HIGH (ref 3.5–5.1)
Sodium: 137 mmol/L (ref 135–145)
Total Bilirubin: 1.1 mg/dL (ref 0.3–1.2)
Total Protein: 7.2 g/dL (ref 6.5–8.1)

## 2020-11-10 LAB — CBC WITH DIFFERENTIAL/PLATELET
Abs Immature Granulocytes: 0.2 10*3/uL — ABNORMAL HIGH (ref 0.00–0.07)
Basophils Absolute: 0.1 10*3/uL (ref 0.0–0.1)
Basophils Relative: 1 %
Eosinophils Absolute: 0.1 10*3/uL (ref 0.0–0.5)
Eosinophils Relative: 0 %
HCT: 43.3 % (ref 39.0–52.0)
Hemoglobin: 13.8 g/dL (ref 13.0–17.0)
Immature Granulocytes: 1 %
Lymphocytes Relative: 5 %
Lymphs Abs: 0.8 10*3/uL (ref 0.7–4.0)
MCH: 32.2 pg (ref 26.0–34.0)
MCHC: 31.9 g/dL (ref 30.0–36.0)
MCV: 100.9 fL — ABNORMAL HIGH (ref 80.0–100.0)
Monocytes Absolute: 0.9 10*3/uL (ref 0.1–1.0)
Monocytes Relative: 5 %
Neutro Abs: 14.2 10*3/uL — ABNORMAL HIGH (ref 1.7–7.7)
Neutrophils Relative %: 88 %
Platelets: 441 10*3/uL — ABNORMAL HIGH (ref 150–400)
RBC: 4.29 MIL/uL (ref 4.22–5.81)
RDW: 15.1 % (ref 11.5–15.5)
WBC: 16.2 10*3/uL — ABNORMAL HIGH (ref 4.0–10.5)
nRBC: 0 % (ref 0.0–0.2)

## 2020-11-10 NOTE — ED Notes (Addendum)
Patients daughter came and asked what was taking so long. It was communicated that they are waiting on the pa/md. Patient daughter yelled that she wants to speak to someone now. Nurse and pa notified to go talk to patient.

## 2020-11-10 NOTE — Discharge Instructions (Addendum)
Continue your medications as prescribed. Follow up with Dr. Tresa Moore as scheduled.   If you develop any high fever, severe/uncontrolled pain or new concern, please return to the ED for further management.

## 2020-11-10 NOTE — ED Notes (Signed)
Patient is resting comfortably. 

## 2020-11-10 NOTE — ED Notes (Signed)
Consult to Urology@01 :55am.

## 2020-11-15 ENCOUNTER — Inpatient Hospital Stay (HOSPITAL_COMMUNITY)
Admission: EM | Admit: 2020-11-15 | Discharge: 2020-11-25 | DRG: 862 | Disposition: A | Discharge status: Skilled Nursing Facility | Payer: Medicare Other | Attending: Internal Medicine | Admitting: Internal Medicine

## 2020-11-15 ENCOUNTER — Emergency Department (HOSPITAL_COMMUNITY): Payer: Medicare Other

## 2020-11-15 ENCOUNTER — Encounter (HOSPITAL_COMMUNITY): Payer: Self-pay | Admitting: *Deleted

## 2020-11-15 DIAGNOSIS — B377 Candidal sepsis: Secondary | ICD-10-CM | POA: Diagnosis present

## 2020-11-15 DIAGNOSIS — Z906 Acquired absence of other parts of urinary tract: Secondary | ICD-10-CM

## 2020-11-15 DIAGNOSIS — N179 Acute kidney failure, unspecified: Secondary | ICD-10-CM | POA: Diagnosis present

## 2020-11-15 DIAGNOSIS — I77810 Thoracic aortic ectasia: Secondary | ICD-10-CM | POA: Diagnosis present

## 2020-11-15 DIAGNOSIS — I959 Hypotension, unspecified: Secondary | ICD-10-CM | POA: Diagnosis not present

## 2020-11-15 DIAGNOSIS — N170 Acute kidney failure with tubular necrosis: Secondary | ICD-10-CM | POA: Diagnosis present

## 2020-11-15 DIAGNOSIS — R079 Chest pain, unspecified: Secondary | ICD-10-CM | POA: Diagnosis not present

## 2020-11-15 DIAGNOSIS — C61 Malignant neoplasm of prostate: Secondary | ICD-10-CM | POA: Diagnosis present

## 2020-11-15 DIAGNOSIS — E785 Hyperlipidemia, unspecified: Secondary | ICD-10-CM | POA: Diagnosis present

## 2020-11-15 DIAGNOSIS — R6520 Severe sepsis without septic shock: Secondary | ICD-10-CM | POA: Diagnosis present

## 2020-11-15 DIAGNOSIS — C679 Malignant neoplasm of bladder, unspecified: Secondary | ICD-10-CM | POA: Diagnosis present

## 2020-11-15 DIAGNOSIS — Z7901 Long term (current) use of anticoagulants: Secondary | ICD-10-CM

## 2020-11-15 DIAGNOSIS — E782 Mixed hyperlipidemia: Secondary | ICD-10-CM | POA: Diagnosis not present

## 2020-11-15 DIAGNOSIS — D62 Acute posthemorrhagic anemia: Secondary | ICD-10-CM | POA: Diagnosis not present

## 2020-11-15 DIAGNOSIS — I272 Pulmonary hypertension, unspecified: Secondary | ICD-10-CM | POA: Diagnosis present

## 2020-11-15 DIAGNOSIS — I429 Cardiomyopathy, unspecified: Secondary | ICD-10-CM | POA: Diagnosis present

## 2020-11-15 DIAGNOSIS — N12 Tubulo-interstitial nephritis, not specified as acute or chronic: Secondary | ICD-10-CM

## 2020-11-15 DIAGNOSIS — Z8551 Personal history of malignant neoplasm of bladder: Secondary | ICD-10-CM

## 2020-11-15 DIAGNOSIS — I480 Paroxysmal atrial fibrillation: Secondary | ICD-10-CM | POA: Diagnosis not present

## 2020-11-15 DIAGNOSIS — I4821 Permanent atrial fibrillation: Secondary | ICD-10-CM | POA: Diagnosis present

## 2020-11-15 DIAGNOSIS — G8929 Other chronic pain: Secondary | ICD-10-CM | POA: Diagnosis present

## 2020-11-15 DIAGNOSIS — Z960 Presence of urogenital implants: Secondary | ICD-10-CM | POA: Diagnosis present

## 2020-11-15 DIAGNOSIS — I361 Nonrheumatic tricuspid (valve) insufficiency: Secondary | ICD-10-CM | POA: Diagnosis not present

## 2020-11-15 DIAGNOSIS — Z936 Other artificial openings of urinary tract status: Secondary | ICD-10-CM

## 2020-11-15 DIAGNOSIS — E8770 Fluid overload, unspecified: Secondary | ICD-10-CM

## 2020-11-15 DIAGNOSIS — Z20822 Contact with and (suspected) exposure to covid-19: Secondary | ICD-10-CM | POA: Diagnosis present

## 2020-11-15 DIAGNOSIS — I2582 Chronic total occlusion of coronary artery: Secondary | ICD-10-CM | POA: Diagnosis present

## 2020-11-15 DIAGNOSIS — R310 Gross hematuria: Secondary | ICD-10-CM | POA: Diagnosis present

## 2020-11-15 DIAGNOSIS — I5022 Chronic systolic (congestive) heart failure: Secondary | ICD-10-CM | POA: Diagnosis present

## 2020-11-15 DIAGNOSIS — R0609 Other forms of dyspnea: Secondary | ICD-10-CM | POA: Diagnosis not present

## 2020-11-15 DIAGNOSIS — I110 Hypertensive heart disease with heart failure: Secondary | ICD-10-CM | POA: Diagnosis present

## 2020-11-15 DIAGNOSIS — I2510 Atherosclerotic heart disease of native coronary artery without angina pectoris: Secondary | ICD-10-CM | POA: Diagnosis present

## 2020-11-15 DIAGNOSIS — M5124 Other intervertebral disc displacement, thoracic region: Secondary | ICD-10-CM | POA: Diagnosis present

## 2020-11-15 DIAGNOSIS — T8144XA Sepsis following a procedure, initial encounter: Secondary | ICD-10-CM | POA: Diagnosis present

## 2020-11-15 DIAGNOSIS — Z9079 Acquired absence of other genital organ(s): Secondary | ICD-10-CM

## 2020-11-15 DIAGNOSIS — E875 Hyperkalemia: Secondary | ICD-10-CM | POA: Diagnosis present

## 2020-11-15 DIAGNOSIS — A419 Sepsis, unspecified organism: Secondary | ICD-10-CM | POA: Diagnosis not present

## 2020-11-15 DIAGNOSIS — B49 Unspecified mycosis: Secondary | ICD-10-CM

## 2020-11-15 DIAGNOSIS — E871 Hypo-osmolality and hyponatremia: Secondary | ICD-10-CM | POA: Diagnosis present

## 2020-11-15 DIAGNOSIS — R410 Disorientation, unspecified: Secondary | ICD-10-CM | POA: Diagnosis present

## 2020-11-15 DIAGNOSIS — Z87891 Personal history of nicotine dependence: Secondary | ICD-10-CM

## 2020-11-15 DIAGNOSIS — R0789 Other chest pain: Secondary | ICD-10-CM

## 2020-11-15 DIAGNOSIS — E860 Dehydration: Secondary | ICD-10-CM | POA: Diagnosis present

## 2020-11-15 DIAGNOSIS — M1612 Unilateral primary osteoarthritis, left hip: Secondary | ICD-10-CM | POA: Diagnosis present

## 2020-11-15 DIAGNOSIS — Z6835 Body mass index (BMI) 35.0-35.9, adult: Secondary | ICD-10-CM

## 2020-11-15 DIAGNOSIS — Z79899 Other long term (current) drug therapy: Secondary | ICD-10-CM

## 2020-11-15 LAB — BASIC METABOLIC PANEL
Anion gap: 12 (ref 5–15)
BUN: 91 mg/dL — ABNORMAL HIGH (ref 8–23)
CO2: 15 mmol/L — ABNORMAL LOW (ref 22–32)
Calcium: 8.5 mg/dL — ABNORMAL LOW (ref 8.9–10.3)
Chloride: 104 mmol/L (ref 98–111)
Creatinine, Ser: 6.49 mg/dL — ABNORMAL HIGH (ref 0.61–1.24)
GFR, Estimated: 9 mL/min — ABNORMAL LOW (ref >60)
Glucose, Bld: 109 mg/dL — ABNORMAL HIGH (ref 70–99)
Potassium: 6.9 mmol/L (ref 3.5–5.1)
Sodium: 131 mmol/L — ABNORMAL LOW (ref 135–145)

## 2020-11-15 LAB — CBG MONITORING, ED
Glucose-Capillary: 90 mg/dL (ref 70–99)
Glucose-Capillary: 72 mg/dL (ref 70–99)
Glucose-Capillary: 114 mg/dL — ABNORMAL HIGH (ref 70–99)

## 2020-11-15 LAB — CBC
HCT: 37.7 % — ABNORMAL LOW (ref 39.0–52.0)
Hemoglobin: 12.4 g/dL — ABNORMAL LOW (ref 13.0–17.0)
MCH: 33 pg (ref 26.0–34.0)
MCHC: 32.9 g/dL (ref 30.0–36.0)
MCV: 100.3 fL — ABNORMAL HIGH (ref 80.0–100.0)
Platelets: 402 10*3/uL — ABNORMAL HIGH (ref 150–400)
RBC: 3.76 MIL/uL — ABNORMAL LOW (ref 4.22–5.81)
RDW: 15.2 % (ref 11.5–15.5)
WBC: 16 10*3/uL — ABNORMAL HIGH (ref 4.0–10.5)
nRBC: 0 % (ref 0.0–0.2)

## 2020-11-15 LAB — TROPONIN I (HIGH SENSITIVITY)
Troponin I (High Sensitivity): 21 ng/L — ABNORMAL HIGH (ref <18)
Troponin I (High Sensitivity): 21 ng/L — ABNORMAL HIGH (ref <18)

## 2020-11-15 LAB — RESP PANEL BY RT-PCR (FLU A&B, COVID) ARPGX2
Influenza A by PCR: NEGATIVE
Influenza B by PCR: NEGATIVE
SARS Coronavirus 2 by RT PCR: NEGATIVE

## 2020-11-15 LAB — GLUCOSE, CAPILLARY: Glucose-Capillary: 92 mg/dL (ref 70–99)

## 2020-11-15 MED ORDER — LACTATED RINGERS IV BOLUS
1000.0000 mL | Freq: Once | INTRAVENOUS | Status: AC
  Administered 2020-11-15: 1000 mL via INTRAVENOUS

## 2020-11-15 MED ORDER — DOCUSATE SODIUM 283 MG RE ENEM
1.0000 | ENEMA | RECTAL | Status: DC | PRN
  Filled 2020-11-15: qty 1

## 2020-11-15 MED ORDER — ONDANSETRON HCL 4 MG/2ML IJ SOLN
4.0000 mg | Freq: Four times a day (QID) | INTRAMUSCULAR | Status: DC | PRN
  Administered 2020-11-24: 4 mg via INTRAVENOUS
  Filled 2020-11-15: qty 2

## 2020-11-15 MED ORDER — INSULIN ASPART 100 UNIT/ML IV SOLN
10.0000 [IU] | Freq: Once | INTRAVENOUS | Status: AC
  Administered 2020-11-15: 10 [IU] via INTRAVENOUS
  Filled 2020-11-15: qty 0.1

## 2020-11-15 MED ORDER — HEPARIN SODIUM (PORCINE) 5000 UNIT/ML IJ SOLN
5000.0000 [IU] | Freq: Three times a day (TID) | INTRAMUSCULAR | Status: DC
  Filled 2020-11-15: qty 1

## 2020-11-15 MED ORDER — LACTATED RINGERS IV SOLN: INTRAVENOUS | Status: DC

## 2020-11-15 MED ORDER — APIXABAN 5 MG PO TABS: 5.0000 mg | ORAL_TABLET | Freq: Two times a day (BID) | ORAL | Status: DC

## 2020-11-15 MED ORDER — OXYCODONE-ACETAMINOPHEN 5-325 MG PO TABS
0.5000 | ORAL_TABLET | Freq: Four times a day (QID) | ORAL | Status: DC | PRN
  Filled 2020-11-15 (×2): qty 1

## 2020-11-15 MED ORDER — SODIUM BICARBONATE 8.4 % IV SOLN
INTRAVENOUS | Status: DC
  Filled 2020-11-15: qty 150

## 2020-11-15 MED ORDER — INSULIN ASPART 100 UNIT/ML ~~LOC~~ SOLN
0.0000 [IU] | Freq: Three times a day (TID) | SUBCUTANEOUS | Status: DC
  Administered 2020-11-17: 2 [IU] via SUBCUTANEOUS
  Administered 2020-11-18: 1 [IU] via SUBCUTANEOUS
  Administered 2020-11-18: 2 [IU] via SUBCUTANEOUS
  Administered 2020-11-19 – 2020-11-23 (×5): 1 [IU] via SUBCUTANEOUS

## 2020-11-15 MED ORDER — LABETALOL HCL 5 MG/ML IV SOLN
10.0000 mg | INTRAVENOUS | Status: DC | PRN
  Filled 2020-11-15: qty 4

## 2020-11-15 MED ORDER — NEPRO/CARBSTEADY PO LIQD
237.0000 mL | Freq: Three times a day (TID) | ORAL | Status: DC | PRN
  Filled 2020-11-15: qty 237

## 2020-11-15 MED ORDER — HYDRALAZINE HCL 25 MG PO TABS
25.0000 mg | ORAL_TABLET | Freq: Three times a day (TID) | ORAL | Status: DC
  Administered 2020-11-16: 25 mg via ORAL
  Filled 2020-11-15 (×2): qty 1

## 2020-11-15 MED ORDER — SORBITOL 70 % SOLN
30.0000 mL | Status: DC | PRN
  Filled 2020-11-15: qty 30

## 2020-11-15 MED ORDER — DEXTROSE 50 % IV SOLN
1.0000 | Freq: Once | INTRAVENOUS | Status: AC
  Administered 2020-11-15: 50 mL via INTRAVENOUS
  Filled 2020-11-15: qty 50

## 2020-11-15 MED ORDER — ONDANSETRON HCL 4 MG PO TABS: 4.0000 mg | ORAL_TABLET | Freq: Four times a day (QID) | ORAL | Status: DC | PRN

## 2020-11-15 MED ORDER — DILTIAZEM HCL ER COATED BEADS 240 MG PO CP24
240.0000 mg | ORAL_CAPSULE | Freq: Every day | ORAL | Status: DC
  Administered 2020-11-16: 240 mg via ORAL
  Filled 2020-11-15 (×3): qty 1

## 2020-11-15 MED ORDER — CAMPHOR-MENTHOL 0.5-0.5 % EX LOTN
1.0000 "application " | TOPICAL_LOTION | Freq: Three times a day (TID) | CUTANEOUS | Status: DC | PRN
  Filled 2020-11-15: qty 222

## 2020-11-15 MED ORDER — ENALAPRIL MALEATE 10 MG PO TABS
20.0000 mg | ORAL_TABLET | Freq: Two times a day (BID) | ORAL | Status: DC
  Administered 2020-11-16: 20 mg via ORAL
  Filled 2020-11-15 (×2): qty 2

## 2020-11-15 MED ORDER — INSULIN ASPART 100 UNIT/ML ~~LOC~~ SOLN: 0.0000 [IU] | Freq: Every day | SUBCUTANEOUS | Status: DC

## 2020-11-15 MED ORDER — SODIUM ZIRCONIUM CYCLOSILICATE 10 G PO PACK
10.0000 g | PACK | Freq: Once | ORAL | Status: AC
  Administered 2020-11-15: 10 g via ORAL
  Filled 2020-11-15: qty 1

## 2020-11-15 MED ORDER — ZOLPIDEM TARTRATE 5 MG PO TABS: 5.0000 mg | ORAL_TABLET | Freq: Every evening | ORAL | Status: DC | PRN

## 2020-11-15 MED ORDER — APIXABAN 5 MG PO TABS
5.0000 mg | ORAL_TABLET | Freq: Two times a day (BID) | ORAL | Status: DC
  Administered 2020-11-16 – 2020-11-25 (×20): 5 mg via ORAL
  Filled 2020-11-15 (×21): qty 1

## 2020-11-15 MED ORDER — HYDROXYZINE HCL 25 MG PO TABS: 25.0000 mg | ORAL_TABLET | Freq: Three times a day (TID) | ORAL | Status: DC | PRN

## 2020-11-15 MED ORDER — OXYCODONE-ACETAMINOPHEN 5-325 MG PO TABS
1.0000 | ORAL_TABLET | ORAL | Status: AC | PRN
Start: 2020-11-15 — End: 2020-11-16
  Administered 2020-11-15 – 2020-11-16 (×3): 1 via ORAL
  Filled 2020-11-15 (×2): qty 1

## 2020-11-15 MED ORDER — CALCIUM CARBONATE ANTACID 1250 MG/5ML PO SUSP
500.0000 mg | Freq: Four times a day (QID) | ORAL | Status: DC | PRN
  Filled 2020-11-15: qty 5

## 2020-11-15 MED ORDER — ATORVASTATIN CALCIUM 10 MG PO TABS
10.0000 mg | ORAL_TABLET | Freq: Every day | ORAL | Status: DC
  Administered 2020-11-16 – 2020-11-24 (×10): 10 mg via ORAL
  Filled 2020-11-15 (×10): qty 1

## 2020-11-15 MED ORDER — ACETAMINOPHEN 325 MG PO TABS
650.0000 mg | ORAL_TABLET | Freq: Four times a day (QID) | ORAL | Status: DC | PRN
  Administered 2020-11-18 – 2020-11-25 (×6): 650 mg via ORAL
  Filled 2020-11-15 (×6): qty 2

## 2020-11-15 MED ORDER — ACETAMINOPHEN 650 MG RE SUPP: 650.0000 mg | Freq: Four times a day (QID) | RECTAL | Status: DC | PRN

## 2020-11-15 MED ORDER — STERILE WATER FOR INJECTION IV SOLN
INTRAVENOUS | Status: DC
  Filled 2020-11-15 (×2): qty 1000
  Filled 2020-11-15: qty 150
  Filled 2020-11-15: qty 1000

## 2020-11-15 MED ORDER — HYDRALAZINE HCL 20 MG/ML IJ SOLN: 10.0000 mg | INTRAMUSCULAR | Status: DC | PRN

## 2020-11-15 MED ORDER — METOPROLOL TARTRATE 25 MG PO TABS
25.0000 mg | ORAL_TABLET | Freq: Two times a day (BID) | ORAL | Status: DC
  Administered 2020-11-16 – 2020-11-25 (×20): 25 mg via ORAL
  Filled 2020-11-15 (×21): qty 1

## 2020-11-15 NOTE — ED Notes (Signed)
Pt's repeat POC BG 72. Pt given 8oz juice at this time. Provider notified and aware.

## 2020-11-15 NOTE — Consult Note (Signed)
Zachary Shannon. Admit Date: 11/15/2020 11/15/2020 Rexene Agent Requesting Physician:  Zenia Resides MD  Reason for Consult:  AKI, Hyperkalemia, Metabolic Acidosis HPI:  45N seen at the request of Dr. Zenia Resides after presenting to the emergency room earlier today with weakness and confusion after cystectomy 10/27/2020 for bladder cancer and found to have acute kidney injury.  PMH Incudes:  Bladder cancer status post cystoprostatectomy 10/27/2020 with placement of ileal conduit, ureteral stents present, followed by Dr. Tresa Moore  Hypertension on enalapril, patient was taking throughout postoperative course  Chronic systolic heart failure, history of CAD  Paroxysmal atrial fibrillation on DOAC  Hyperlipidemia  Patient's renal function was normal prior to surgery.  Discharge creatinine was 1.1.  He did well for the first week after surgery but then developed significant pain in his back, inability to sleep in the bed requiring a recliner.  His oral intake of both fluids and solids dropped off.  He was seen in the ED on 4/19 because of progressive decline and outpatient follow-up with urology was arranged, but has not yet been completed.  At that time his creatinine was 2.3.  The patient's daughter, from [place], came in about a week ago.  Over the past 48 hours she has noticed that he is increasingly confused, weekend, and unable to ambulate.  Further, the patient has been passing blood clots into his urostomy bag.  Because of this she brought him to the emergency room.  She had been using oxycodone for pain control.  No Motrin/Advil/Aleve.  No recent antibiotics.  Patient does not take potassium supplements.  Urostomy output has been fairly normal.  In the ED the patient was identified as having AKI with a creatinine of 6.6, BUN 91, K6.9, HCO3 15.  His anion gap is 12 with a most recent albumin of 3.4.  CT of the abdomen and pelvis demonstrated bilateral ureteral stents present without evidence of  hydronephrosis or kidney stone.  No chest pain, dyspnea.  No peripheral edema.  No rashes.  In the ED the patient is receive Lokelma, dextrose/insulin, 1 L LR bolus and now LR at 125 ml/h.   Creatinine, Ser (mg/dL)  Date Value  11/15/2020 6.49 (H)  11/09/2020 2.26 (H)  11/01/2020 1.09  10/31/2020 1.03  10/30/2020 1.04  10/29/2020 1.26 (H)  10/28/2020 1.67 (H)  10/26/2020 1.13  07/13/2020 1.09  10/29/2019 1.50 (H)  ]  ROS NSAIDS: denies IV Contrast no exposure TMP/SMX no exposure Hypotension not present Balance of 12 systems is negative w/ exceptions as above  PMH  Past Medical History:  Diagnosis Date  . Anticoagulant long-term use    eliquis--- managed by cardiology  . Bladder tumor   . Cancer ([place])    bladder  . Cardiomyopathy [facility])    dx 2009 per cardiac cath w/ ef 20-25%;   per cardiology note   . Coronary artery disease cardiologist--- dr Ronnell Freshwater  ([facility] [place] )   per cardiologist note dated 09-16-2019 , received via fax,  pt had cardiac cath 2009 done at [facility] that showed CTO of LAD with excellent collateralization from LCx and RCA, no PCI performed, sig. cardiomyopathy ef 20-25%;  per note stated improved to 45-50% (no documentation available in note and no echo)  . Dysrhythmia    Afib  . Hematuria   . Hyperlipidemia   . Hypertension    followed by pcp  . Nocturia   . PAF (paroxysmal atrial fibrillation) [facility])    cardiologist--- dr  Ronnell Freshwater--- dx 2009   [place]  Past Surgical History:  Procedure Laterality Date  . CARDIAC CATHETERIZATION  2009   @[place]  [facility]   per pt done due to dx AFib;  told no blockage's  . CATARACT EXTRACTION W/ INTRAOCULAR LENS  IMPLANT, BILATERAL  02/2019  . CYSTOSCOPY WITH INJECTION N/A 10/27/2020   Procedure: CYSTOSCOPY WITH INJECTION OF INDOCYANINE GREEN DYE;  Surgeon: Alexis Frock, MD;  [facility];  Service: Urology;  Laterality: N/A;  . LYMPH  NODE DISSECTION Bilateral 10/27/2020   Procedure: LYMPH NODE DISSECTION;  Surgeon: Alexis Frock, MD;  [facility];  Service: Urology;  Laterality: Bilateral;  . ROBOT ASSISTED LAPAROSCOPIC COMPLETE CYSTECT ILEAL CONDUIT N/A 10/27/2020   Procedure: XI ROBOTIC ASSISTED LAPAROSCOPIC COMPLETE CYSTECT ILEAL CONDUIT, RADICAL PROSTATECTOMY AND BILATERAL INGUINAL HERNIA REPAIR;  Surgeon: Alexis Frock, MD;  [facility];  Service: Urology;  Laterality: N/A;  6 HRS  . TRANSURETHRAL RESECTION OF BLADDER TUMOR N/A 10/29/2019   Procedure: TRANSURETHRAL RESECTION OF BLADDER TUMOR  (TURBT), CYSTOSCOPY/ INTRAVESICAL INSTILLATION OF GEMCITABINE;  Surgeon: Ceasar Mons, MD;  Location: [facility];  Service: Urology;  Laterality: N/A;  . TRANSURETHRAL RESECTION OF BLADDER TUMOR N/A 07/14/2020   Procedure: TRANSURETHRAL RESECTION OF BLADDER TUMOR (TURBT) WITH CYSTOSCOPY/ GEMCITABINE INSTILLATION;  Surgeon: Ceasar Mons, MD;  [facility];  Service: Urology;  Laterality: N/A;   FH No family history on file. SH  reports that he quit smoking about 12 years ago. His smoking use included cigarettes. He has a 14.00 pack-year smoking history. He quit smokeless tobacco use about 12 years ago. He reports that he does not drink alcohol and does not use drugs. Allergies No Known Allergies Home medications Prior to Admission medications   Medication Sig Start Date End Date Taking? Authorizing Provider  apixaban (ELIQUIS) 5 MG TABS tablet Take 5 mg by mouth 2 (two) times daily.   Yes [provider]  atorvastatin (LIPITOR) 10 MG tablet Take 10 mg by mouth at bedtime.   Yes [provider]  diltiazem (DILACOR XR) 240 MG 24 hr capsule Take 240 mg by mouth daily.   Yes [provider]  enalapril (VASOTEC) 20 MG tablet Take 20 mg by mouth 2 (two) times daily.   Yes [provider]  hydrALAZINE (APRESOLINE) 25 MG tablet Take 25 mg by mouth 3  (three) times daily.   Yes [provider]  metoprolol tartrate (LOPRESSOR) 50 MG tablet Take 25 mg by mouth 2 (two) times daily.   Yes [provider]  oxyCODONE-acetaminophen (PERCOCET) 5-325 MG tablet Take 1 tablet by mouth every 6 (six) hours as needed for moderate pain or severe pain. Post-operatively Patient taking differently: Take 0.5-1 tablets by mouth every 6 (six) hours as needed for moderate pain or severe pain. Post-operatively 11/02/20 11/02/21 Yes Alexis Frock, MD    Current Medications Scheduled Meds: Continuous Infusions: . lactated ringers 125 mL/hr at 11/15/20 1603   PRN Meds:.  CBC Recent Labs  Lab 11/09/20 2251 11/15/20 1427  WBC 16.2* 16.0*  NEUTROABS 14.2*  --   HGB 13.8 12.4*  HCT 43.3 37.7*  MCV 100.9* 100.3*  PLT 441* 841*   Basic Metabolic Panel Recent Labs  Lab 11/09/20 2251 11/15/20 1427  NA 137 131*  K 5.4* 6.9*  CL 108 104  CO2 21* 15*  GLUCOSE 115* 109*  BUN 28* 91*  CREATININE 2.26* 6.49*  CALCIUM 8.7* 8.5*    Physical Exam  Blood pressure  122/67, pulse 74, temperature 98.3 F (36.8 C), temperature source Oral, resp. rate (!) 26, SpO2 99 %. GEN: NAD, appears uncomfortable ENT: NCAT EYES: EOMI CV: Regular, normal S1 and S2 PULM: Clear bilaterally ABD: Surgical sites intact, some mild periincisional erythema, not very tender SKIN: As above EXT: No peripheral edema NEURO: Intact, answers questions appropriately  EKG in the ED without peaked T waves or QRS prolongation  Assessment 36M AKI, hyperkalemia, NAG metabolic Acidosis in setting of cystoprostatectomy 10/27/20, progressive anorexia/hypovolemia, ongoing use of ACE inhibitor.  Most likely this is a combination of hypovolemia and ongoing use of enalapril.  Imaging is without obstruction with intact ureteral stents but I think we need urology's input of urology to make sure that no other concerns are identified.  1. AKI, baseline creatinine 1.0; likely  hypovolemia+ACEi 2. 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 Recent hematuria into ileal conduit 4. A. fib on apixaban 5. Hyperkalemia, moderate, no EKG changes, status post medical management in the ED 6. NAG metabolic acidosis likely related to #1 and #2 7. Mild hyponatremia 8. History of systolic heart failure and CAD  Plan 1. Change IVFs to sodium bicarbonate 2. Trend labs overnight 3. Hold ACE inhibitor 4. No immediate indication for dialysis, I suspect that medical management will be effective 5. Await urology input 6. Daily weights, Daily Renal Panel, Strict I/Os, Avoid nephrotoxins (NSAIDs, judicious IV Contrast)    Rexene Agent  11/15/2020, 7:56 PM

## 2020-11-15 NOTE — ED Notes (Signed)
Provider at the bedside to evaluate. 

## 2020-11-15 NOTE — ED Triage Notes (Signed)
Pt daughter reports blood clots in the patients urostomy bag, nausea and lack of appetite for the past few days, back pain. Daughter also reports he was confused and unable to walk d/t weakness this morning.

## 2020-11-15 NOTE — ED Notes (Signed)
Pt attached to cardiac monitor x3. Pt is tachypneaic at 25 breaths/min. VS are otherwise stable. Pt is A&O x4.

## 2020-11-15 NOTE — ED Notes (Signed)
Pt back from ct at this time.

## 2020-11-15 NOTE — ED Notes (Signed)
Pt to CT at this time.

## 2020-11-15 NOTE — ED Provider Notes (Addendum)
[name] Provider Note   CSN: [id number] Arrival date & time: 11/15/20  1337     History Chief Complaint  Patient presents with  . Weakness  . Back Pain  . Hematuria    Zachary Shannon. is a 70 y.o. male.  70 year old male who presents with 2 days of weakness and confusion.  Patient recently had bladder surgery which involved a cystectomy 3 weeks ago.  Patient states he has occasional blood in his urostomy bag.  He does use Eliquis due to a history of PAF.  States he has had anorexia for several days.  Has had nausea but no vomiting.  No fever.  Has been using opiates occasionally.  Has had chronic back pain for 3 weeks but he does sleep in a recliner.  Denies any history of trauma.  Called his urologist and was told to come here        Past Medical History:  Diagnosis Date  . Anticoagulant long-term use    eliquis--- managed by cardiology  . Bladder tumor   . Cancer ([place])    bladder  . Cardiomyopathy [facility])    dx 2009 per cardiac cath w/ ef 20-25%;   per cardiology note   . Coronary artery disease cardiologist--- dr Ronnell Freshwater  ([facility] [place] )   per cardiologist note dated 09-16-2019 , received via fax,  pt had cardiac cath 2009 done at [facility] that showed CTO of LAD with excellent collateralization from LCx and RCA, no PCI performed, sig. cardiomyopathy ef 20-25%;  per note stated improved to 45-50% (no documentation available in note and no echo)  . Dysrhythmia    Afib  . Hematuria   . Hyperlipidemia   . Hypertension    followed by pcp  . Nocturia   . PAF (paroxysmal atrial fibrillation) [facility])    cardiologist--- dr Ronnell Freshwater--- dx 2009    Patient Active Problem List   Diagnosis Date Noted  . Bladder cancer ([place]) 10/26/2020    Past Surgical History:  Procedure Laterality Date  . CARDIAC CATHETERIZATION  2009   @[facility]   per pt done due to  dx AFib;  told no blockage's  . CATARACT EXTRACTION W/ INTRAOCULAR LENS  IMPLANT, BILATERAL  02/2019  . CYSTOSCOPY WITH INJECTION N/A 10/27/2020   Procedure: CYSTOSCOPY WITH INJECTION OF INDOCYANINE GREEN DYE;  Surgeon: Alexis Frock, MD;  Location: [facility];  Service: Urology;  Laterality: N/A;  . LYMPH NODE DISSECTION Bilateral 10/27/2020   Procedure: LYMPH NODE DISSECTION;  Surgeon: Alexis Frock, MD;  Location: [facility];  Service: Urology;  Laterality: Bilateral;  . ROBOT ASSISTED LAPAROSCOPIC COMPLETE CYSTECT ILEAL CONDUIT N/A 10/27/2020   Procedure: XI ROBOTIC ASSISTED LAPAROSCOPIC COMPLETE CYSTECT ILEAL CONDUIT, RADICAL PROSTATECTOMY AND BILATERAL INGUINAL HERNIA REPAIR;  Surgeon: Alexis Frock, MD;  Location: [facility];  Service: Urology;  Laterality: N/A;  6 HRS  . TRANSURETHRAL RESECTION OF BLADDER TUMOR N/A 10/29/2019   Procedure: TRANSURETHRAL RESECTION OF BLADDER TUMOR  (TURBT), CYSTOSCOPY/ INTRAVESICAL INSTILLATION OF GEMCITABINE;  Surgeon: Ceasar Mons, MD;  Location: [facility];  Service: Urology;  Laterality: N/A;  . TRANSURETHRAL RESECTION OF BLADDER TUMOR N/A 07/14/2020   Procedure: TRANSURETHRAL RESECTION OF BLADDER TUMOR (TURBT) WITH CYSTOSCOPY/ GEMCITABINE INSTILLATION;  Surgeon: Ceasar Mons, MD;  Location: [facility];  Service: Urology;  Laterality: N/A;       No family history on file.  Social History   Tobacco Use  .  Smoking status: Former Smoker    Packs/day: 0.50    Years: 28.00    Pack years: 14.00    Types: Cigarettes    Quit date: 10/08/2008    Years since quitting: 12.1  . Smokeless tobacco: Former Systems developer    Quit date: 10/08/2008  Vaping Use  . Vaping Use: Never used  Substance Use Topics  . Alcohol use: Never  . Drug use: Never    Home Medications Prior to Admission medications   Medication Sig Start Date End Date Taking? Authorizing Provider  apixaban (ELIQUIS) 5 MG TABS tablet Take 5 mg by mouth 2 (two) times daily.     [provider]  atorvastatin (LIPITOR) 10 MG tablet Take 10 mg by mouth at bedtime.    [provider]  diltiazem (DILACOR XR) 240 MG 24 hr capsule Take 240 mg by mouth daily.    [provider]  enalapril (VASOTEC) 20 MG tablet Take 20 mg by mouth 2 (two) times daily.    [provider]  hydrALAZINE (APRESOLINE) 25 MG tablet Take 25 mg by mouth 3 (three) times daily.    [provider]  metoprolol tartrate (LOPRESSOR) 50 MG tablet Take 25 mg by mouth 2 (two) times daily.    [provider]  oxyCODONE-acetaminophen (PERCOCET) 5-325 MG tablet Take 1 tablet by mouth every 6 (six) hours as needed for moderate pain or severe pain. Post-operatively 11/02/20 11/02/21  Alexis Frock, MD    Allergies    Patient has no known allergies.  Review of Systems   Review of Systems  All other systems reviewed and are negative.   Physical Exam Updated Vital Signs BP (!) 121/54 (BP Location: Right Arm)   Pulse 67   Temp 99.1 F (37.3 C) (Oral)   Resp (!) 25   SpO2 100%   Physical Exam Vitals and nursing note reviewed.  Constitutional:      General: He is not in acute distress.    Appearance: Normal appearance. He is well-developed. He is not toxic-appearing.  HENT:     Head: Normocephalic and atraumatic.  Eyes:     General: Lids are normal.     Conjunctiva/sclera: Conjunctivae normal.     Pupils: Pupils are equal, round, and reactive to light.  Neck:     Thyroid: No thyroid mass.     Trachea: No tracheal deviation.  Cardiovascular:     Rate and Rhythm: Normal rate and regular rhythm.     Heart sounds: Normal heart sounds. No murmur heard. No gallop.   Pulmonary:     Effort: Pulmonary effort is normal. No respiratory distress.     Breath sounds: Normal breath sounds. No stridor. No decreased breath sounds, wheezing, rhonchi or rales.  Abdominal:     General: Bowel sounds are normal. There is no distension.     Palpations: Abdomen is  soft.     Tenderness: There is no abdominal tenderness. There is no rebound.  Musculoskeletal:        General: No tenderness. Normal range of motion.     Cervical back: Normal range of motion and neck supple.  Skin:    General: Skin is warm and dry.     Findings: No abrasion or rash.  Neurological:     Mental Status: He is alert and oriented to person, place, and time.     GCS: GCS eye subscore is 4. GCS verbal subscore is 5. GCS motor subscore is 6.     Cranial  Nerves: No cranial nerve deficit.     Sensory: No sensory deficit.     Comments: Strength is 5 of 5 in upper as well as lower extremities  Psychiatric:        Attention and Perception: Attention normal.        Mood and Affect: Affect is flat.        Speech: Speech is delayed.        Behavior: Behavior is withdrawn.     ED Results / Procedures / Treatments   Labs (all labs ordered are listed, but only abnormal results are displayed) Labs Reviewed  BASIC METABOLIC PANEL - Abnormal; Notable for the following components:      Result Value   Sodium 131 (*)    Potassium 6.9 (*)    CO2 15 (*)    Glucose, Bld 109 (*)    BUN 91 (*)    Creatinine, Ser 6.49 (*)    Calcium 8.5 (*)    GFR, Estimated 9 (*)    All other components within normal limits  CBC - Abnormal; Notable for the following components:   WBC 16.0 (*)    RBC 3.76 (*)    Hemoglobin 12.4 (*)    HCT 37.7 (*)    MCV 100.3 (*)    Platelets 402 (*)    All other components within normal limits  TROPONIN I (HIGH SENSITIVITY) - Abnormal; Notable for the following components:   Troponin I (High Sensitivity) 21 (*)    All other components within normal limits    EKG EKG Interpretation  Date/Time:  Monday November 15 2020 14:03:24 EDT Ventricular Rate:  62 PR Interval:    QRS Duration: 108 QT Interval:  430 QTC Calculation: 437 R Axis:   62 Text Interpretation: Atrial fibrillation 12 Lead; [place] Confirmed by Lacretia Leigh ([id number]) on 11/15/2020 3:08:09  PM   Radiology DG Chest 2 View  Result Date: 11/15/2020 CLINICAL DATA:  Chest pain EXAM: CHEST - 2 VIEW COMPARISON:  None. FINDINGS: Cardiomegaly. Pulmonary vascular congestion. No pleural effusion or pneumothorax. No acute osseous abnormality. IMPRESSION: Pulmonary vascular congestion and cardiomegaly. Electronically Signed   By: Macy Mis M.D.   On: 11/15/2020 15:09    Procedures Procedures   Medications Ordered in ED Medications  sodium zirconium cyclosilicate (LOKELMA) packet 10 g (has no administration in time range)  insulin aspart (novoLOG) injection 10 Units (has no administration in time range)    And  dextrose 50 % solution 50 mL (has no administration in time range)  lactated ringers bolus 1,000 mL (has no administration in time range)  lactated ringers infusion (has no administration in time range)    ED Course  I have reviewed the triage vital signs and the nursing notes.  Pertinent labs & imaging results that were available during my care of the patient were reviewed by me and considered in my medical decision making (see chart for details).    MDM Rules/Calculators/A&P                         Head CT without acute findings here. Patient with evidence of renal failure as well as hyperkalemia.  Given hyperkalemia order set including insulin, D50, Lokelma.  Consult to urology and nephrology placed.  Will admit to the hospitalist  CRITICAL CARE Performed by: Leota Jacobsen Total critical care time: 60 minutes Critical care time was exclusive of separately billable procedures and treating other patients. Critical care was  necessary to treat or prevent imminent or life-threatening deterioration. Critical care was time spent personally by me on the following activities: development of treatment plan with patient and/or surrogate as well as nursing, discussions with consultants, evaluation of patient's response to treatment, examination of patient, obtaining history  from patient or surrogate, ordering and performing treatments and interventions, ordering and review of laboratory studies, ordering and review of radiographic studies, pulse oximetry and re-evaluation of patient's condition.  Final Clinical Impression(s) / ED Diagnoses Final diagnoses:  None    Rx / DC Orders ED Discharge Orders    None       Lacretia Leigh, MD 11/15/20 1749    Lacretia Leigh, MD 11/15/20 [phone]

## 2020-11-15 NOTE — ED Notes (Signed)
ED TO INPATIENT HANDOFF REPORT  Name/Age/Gender Zachary Shannon. 70 y.o. male  Code Status Code Status History    Date Active Date Inactive Code Status Order ID Comments User Context   10/27/2020 1704 11/02/2020 1452 Full Code [id number]  Lattie Corns Inpatient   10/26/2020 1244 10/27/2020 1216 Full Code [id number]  Alexis Frock, MD Inpatient   Advance Care Planning Activity    Questions for Most Recent Historical Code Status (Order [id number])         Advance Directive Documentation   Flowsheet Row Most Recent Value  Type of Advance Directive Healthcare Power of Attorney  Pre-existing out of facility DNR order (yellow form or pink MOST form) --  "MOST" Form in Place? --      Home/SNF/Other Home  Chief Complaint AKI (acute kidney injury) ([place]) [N17.9]  Level of Care/Admitting Diagnosis ED Disposition    ED Disposition Condition [place]: [place] [[id number]]  Level of Care: Telemetry [5]  Admit to tele based on following criteria: Monitor QTC interval  May admit patient to [facility] or [facility] if equivalent level of care is available:: Yes  Covid Evaluation: Asymptomatic Screening Protocol (No Symptoms)  Diagnosis: AKI (acute kidney injury) [facility]) [[id number]]  Admitting Physician: Elwyn Reach [[id number]]  Attending Physician: Elwyn Reach [[id number]]  Estimated length of stay: past midnight tomorrow  Certification:: I certify this patient will need inpatient services for at least 2 midnights       Medical History Past Medical History:  Diagnosis Date  . Anticoagulant long-term use    eliquis--- managed by cardiology  . Bladder tumor   . Cancer ([place])    bladder  . Cardiomyopathy [facility])    dx 2009 per cardiac cath w/ ef 20-25%;   per cardiology note   . Coronary artery disease cardiologist--- dr Ronnell Freshwater  ([facility] [place] )   per cardiologist note dated 09-16-2019 ,  received via fax,  pt had cardiac cath 2009 done at [facility] that showed CTO of LAD with excellent collateralization from LCx and RCA, no PCI performed, sig. cardiomyopathy ef 20-25%;  per note stated improved to 45-50% (no documentation available in note and no echo)  . Dysrhythmia    Afib  . Hematuria   . Hyperlipidemia   . Hypertension    followed by pcp  . Nocturia   . PAF (paroxysmal atrial fibrillation) [facility])    cardiologist--- dr Ronnell Freshwater--- dx 2009    Allergies No Known Allergies  IV Location/Drains/Wounds Patient Lines/Drains/Airways Status    Active Line/Drains/Airways    Name Placement date Placement time Site Days   Peripheral IV 11/15/20 Left Antecubital 11/15/20  1530  Antecubital  less than 1   Urostomy Ileal conduit RUQ 10/27/20  --  RUQ  19   Ureteral Drain/Stent Right ureter 7.2 Fr. 10/27/20  1346  Right ureter  19   Ureteral Drain/Stent Left ureter 7.2 Fr. 10/27/20  1326  Left ureter  19   Incision (Closed) 10/29/19 Penis Other (Comment) 10/29/19  1128  -- 383   Incision (Closed) 10/27/20 N/A Other (Comment) 10/27/20  1421  -- 19   Incision (Closed) 10/27/20 Abdomen 10/27/20  1424  -- 19   Incision - 3 Ports Abdomen 1: Left;Lateral 2: Right;Mid;Lateral 3: Right;Lateral 10/27/20  --  -- 19          Labs/Imaging Results for orders placed or performed during the hospital encounter  of 11/15/20 (from the past 48 hour(s))  Basic metabolic panel     Status: Abnormal   Collection Time: 11/15/20  2:27 PM  Result Value Ref Range   Sodium 131 (L) 135 - 145 mmol/L   Potassium 6.9 (HH) 3.5 - 5.1 mmol/L    Comment: REPEATED TO VERIFY NO VISIBLE HEMOLYSIS CRITICAL RESULT CALLED TO, READ BACK BY AND VERIFIED WITH: P.DOWD, RN AT 1516 ON 04.25.22 BY N.THOMPSON    Chloride 104 98 - 111 mmol/L   CO2 15 (L) 22 - 32 mmol/L   Glucose, Bld 109 (H) 70 - 99 mg/dL    Comment: Glucose reference range applies only to samples taken after fasting for at least  8 hours.   BUN 91 (H) 8 - 23 mg/dL   Creatinine, Ser 0.25 (H) 0.61 - 1.24 mg/dL   Calcium 8.5 (L) 8.9 - 10.3 mg/dL   GFR, Estimated 9 (L) >60 mL/min    Comment: (NOTE) Calculated using the CKD-EPI Creatinine Equation (2021)    Anion gap 12 5 - 15    Comment: Performed at [facility], [address], [place], [place] [address]  CBC     Status: Abnormal   Collection Time: 11/15/20  2:27 PM  Result Value Ref Range   WBC 16.0 (H) 4.0 - 10.5 K/uL   RBC 3.76 (L) 4.22 - 5.81 MIL/uL   Hemoglobin 12.4 (L) 13.0 - 17.0 g/dL   HCT 23.7 (L) 62.8 - 31.5 %   MCV 100.3 (H) 80.0 - 100.0 fL   MCH 33.0 26.0 - 34.0 pg   MCHC 32.9 30.0 - 36.0 g/dL   RDW 17.6 16.0 - 73.7 %   Platelets 402 (H) 150 - 400 K/uL   nRBC 0.0 0.0 - 0.2 %    Comment: Performed at [facility], [address] [place], [place] [address]  Troponin I (High Sensitivity)     Status: Abnormal   Collection Time: 11/15/20  2:27 PM  Result Value Ref Range   Troponin I (High Sensitivity) 21 (H) <18 ng/L    Comment: (NOTE) Elevated high sensitivity troponin I (hsTnI) values and significant  changes across serial measurements may suggest ACS but many other  chronic and acute conditions are known to elevate hsTnI results.  Refer to the Links section for chest pain algorithms and additional  guidance. Performed at [facility], [address], [place], [place] [address]   CBG monitoring, ED     Status: None   Collection Time: 11/15/20  3:49 PM  Result Value Ref Range   Glucose-Capillary 90 70 - 99 mg/dL    Comment: Glucose reference range applies only to samples taken after fasting for at least 8 hours.  Troponin I (High Sensitivity)     Status: Abnormal   Collection Time: 11/15/20  4:12 PM  Result Value Ref Range   Troponin I (High Sensitivity) 21 (H) <18 ng/L    Comment: (NOTE) Elevated high sensitivity troponin I (hsTnI) values and significant  changes across serial  measurements may suggest ACS but many other  chronic and acute conditions are known to elevate hsTnI results.  Refer to the "Links" section for chest pain algorithms and additional  guidance. Performed at [facility], [address] [place], [place] [address]   CBG monitoring, ED     Status: None   Collection Time: 11/15/20  5:02 PM  Result Value Ref Range   Glucose-Capillary 72 70 - 99 mg/dL  Comment: Glucose reference range applies only to samples taken after fasting for at least 8 hours.  CBG monitoring, ED     Status: Abnormal   Collection Time: 11/15/20  6:05 PM  Result Value Ref Range   Glucose-Capillary 114 (H) 70 - 99 mg/dL    Comment: Glucose reference range applies only to samples taken after fasting for at least 8 hours.  Resp Panel by RT-PCR (Flu A&B, Covid) Nasopharyngeal Swab     Status: None   Collection Time: 11/15/20  6:33 PM   Specimen: Nasopharyngeal Swab; Nasopharyngeal(NP) swabs in vial transport medium  Result Value Ref Range   SARS Coronavirus 2 by RT PCR NEGATIVE NEGATIVE    Comment: (NOTE) SARS-CoV-2 target nucleic acids are NOT DETECTED.  The SARS-CoV-2 RNA is generally detectable in upper respiratory specimens during the acute phase of infection. The lowest concentration of SARS-CoV-2 viral copies this assay can detect is 138 copies/mL. A negative result does not preclude SARS-Cov-2 infection and should not be used as the sole basis for treatment or other patient management decisions. A negative result may occur with  improper specimen collection/handling, submission of specimen other than nasopharyngeal swab, presence of viral mutation(s) within the areas targeted by this assay, and inadequate number of viral copies(<138 copies/mL). A negative result must be combined with clinical observations, patient history, and epidemiological information. The expected result is Negative.  Fact Sheet for Patients:   EntrepreneurPulse.com.au  Fact Sheet for Healthcare Providers:  IncredibleEmployment.be  This test is no t yet approved or cleared by the [place] FDA and  has been authorized for detection and/or diagnosis of SARS-CoV-2 by FDA under an Emergency Use Authorization (EUA). This EUA will remain  in effect (meaning this test can be used) for the duration of the COVID-19 declaration under Section 564(b)(1) of the Act, 21 U.S.C.section 360bbb-3(b)(1), unless the authorization is terminated  or revoked sooner.       Influenza A by PCR NEGATIVE NEGATIVE   Influenza B by PCR NEGATIVE NEGATIVE    Comment: (NOTE) The Xpert Xpress SARS-CoV-2/FLU/RSV plus assay is intended as an aid in the diagnosis of influenza from Nasopharyngeal swab specimens and should not be used as a sole basis for treatment. Nasal washings and aspirates are unacceptable for Xpert Xpress SARS-CoV-2/FLU/RSV testing.  Fact Sheet for Patients: EntrepreneurPulse.com.au  Fact Sheet for Healthcare Providers: IncredibleEmployment.be  This test is not yet approved or cleared by the [place] FDA and has been authorized for detection and/or diagnosis of SARS-CoV-2 by FDA under an Emergency Use Authorization (EUA). This EUA will remain in effect (meaning this test can be used) for the duration of the COVID-19 declaration under Section 564(b)(1) of the Act, 21 U.S.C. section 360bbb-3(b)(1), unless the authorization is terminated or revoked.  Performed at [facility], [place] [address] [place], [place] [address]    DG Chest 2 View  Result Date: 11/15/2020 CLINICAL DATA:  Chest pain EXAM: CHEST - 2 VIEW COMPARISON:  None. FINDINGS: Cardiomegaly. Pulmonary vascular congestion. No pleural effusion or pneumothorax. No acute osseous abnormality. IMPRESSION: Pulmonary vascular congestion and cardiomegaly. Electronically Signed   By:  Macy Mis M.D.   On: 11/15/2020 15:09   CT Head Wo Contrast  Result Date: 11/15/2020 CLINICAL DATA:  70 year old male with concern for intracranial hemorrhage. EXAM: CT HEAD WITHOUT CONTRAST TECHNIQUE: Contiguous axial images were obtained from the base of the skull through the vertex without intravenous contrast. COMPARISON:  None. FINDINGS: Brain: Mild age-related atrophy and chronic  microvascular ischemic changes. There is no acute intracranial hemorrhage. No mass effect or midline shift. No extra-axial fluid collection. Vascular: No hyperdense vessel or unexpected calcification. Skull: Normal. Negative for fracture or focal lesion. Sinuses/Orbits: No acute finding. Other: None IMPRESSION: 1. No acute intracranial pathology. 2. Mild age-related atrophy and chronic microvascular ischemic changes. Electronically Signed   By: Anner Crete M.D.   On: 11/15/2020 16:57   CT Renal Stone Study  Result Date: 11/15/2020 CLINICAL DATA:  Flank pain. Urostomy 3 weeks ago. Bladder cancer. Hematuria. EXAM: CT ABDOMEN AND PELVIS WITHOUT CONTRAST TECHNIQUE: Multidetector CT imaging of the abdomen and pelvis was performed following the standard protocol without IV contrast. COMPARISON:  11/09/2020 FINDINGS: Lower chest: Bibasilar scarring. Mild cardiomegaly, without pericardial or pleural effusion. Right coronary artery calcification. Hepatobiliary: Normal noncontrast appearance of the liver. Stones in the gallbladder without acute cholecystitis or biliary duct dilatation. Pancreas: Normal, without mass or ductal dilatation. Spleen: Normal in size, without focal abnormality. Adrenals/Urinary Tract: Normal adrenal glands. Bilateral ureteric stents again identified. The left-sided stent originates in an extrarenal pelvis. No renal calculi or hydronephrosis. Cystectomy and ileal conduit creation. 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 Stomach/Bowel: Normal stomach, without wall thickening. Descending  duodenal diverticulum. Otherwise normal small bowel. Extensive colonic diverticulosis. Normal terminal ileum and appendix. Otherwise normal small bowel. Vascular/Lymphatic: Aortic atherosclerosis. No abdominopelvic adenopathy. Reproductive: Prostatectomy. Other: Small volume perisplenic fluid is slightly increased. Pelvic interloop mesenteric fluid is minimally increased, including on 67/2. Pelvic cul-de-sac fluid is similar. Again identified is fluid within a right inguinal hernia. Fat containing tiny left inguinal hernia. No free intraperitoneal air. Again identified is extensive subcutaneous emphysema about the abdominopelvic and lower thoracic walls. Similar in distribution, slightly decreased. Musculoskeletal: Left hip osteoarthritis. IMPRESSION: 1. Status post cystoprostatectomy with ileal conduit creation. Similar appearance of ureteric stents in place, without urinary tract calculi or hydronephrosis. 2. Similar to slight increase in small volume abdominopelvic fluid. Minimal decrease in extensive subcutaneous emphysema. 3. Right-sided fluid containing and left-sided fat containing inguinal hernias, similar. 4. Coronary artery atherosclerosis. Aortic Atherosclerosis (ICD10-I70.0). Electronically Signed   By: Abigail Miyamoto M.D.   On: 11/15/2020 17:09    Pending Labs Unresulted Labs (From admission, onward)          Start     Ordered   11/16/20 3220  Basic metabolic panel  Once,   STAT        11/15/20 2007          Vitals/Pain Today's Vitals   11/15/20 1700 11/15/20 1730 11/15/20 1800 11/15/20 1829  BP: (!) 113/56 106/61 (!) 116/58 122/67  Pulse: 67 66 61 74  Resp: (!) 28 (!) 23 (!) 26 (!) 26  Temp:    98.3 F (36.8 C)  TempSrc:    Oral  SpO2: 98% 99% 98% 99%  PainSc:        Isolation Precautions Airborne and Contact precautions  Medications Medications  sodium bicarbonate 150 mEq in dextrose 5 % 1,150 mL infusion (has no administration in time range)  sodium zirconium  cyclosilicate (LOKELMA) packet 10 g (10 g Oral Given 11/15/20 1603)  insulin aspart (novoLOG) injection 10 Units (10 Units Intravenous Given 11/15/20 1603)    And  dextrose 50 % solution 50 mL (50 mLs Intravenous Given 11/15/20 1558)  lactated ringers bolus 1,000 mL (0 mLs Intravenous Stopped 11/15/20 1832)  dextrose 50 % solution 50 mL (50 mLs Intravenous Given 11/15/20 1727)    Mobility walks with device

## 2020-11-15 NOTE — H&P (Signed)
History and Physical   Zachary Shannon. KN:[id number] DOB: [birth year] DOA: 11/15/2020  Referring MD/NP/PA: Dr. Vivi Martens  PCP: Minna Antis, DO   Patient coming from: Home  Chief Complaint: Weakness back pain hematuria  HPI: Zachary Shannon. is a 70 y.o. male with medical history significant of bladder tumor status post recent cystoprostatectomy on April 6 with placement of ileal conduit and lateral stents.  Patient presents to the ER today with confusion, some pain in his flanks as well as suprapubic area.  Patient was found to have a creatinine of more than 6.  His creatinine was 1.1 when he left the hospital.  Patient also has history of paroxysmal atrial fibrillation, essential hypertension, chronic systolic heart failure, coronary artery disease and hyperlipidemia.  Currently on Eliquis.  Patient was seen in the outpatient setting for follow-up with urology where he was creatinine was found to be 2.3.  His daughter noticed progressive weakness confusion over the last few days.  He denied taking NSAIDs but continue to take ACE inhibitor.  No diuretics.  Patient is here now with acute kidney injury, his imaging showed no obstructive symptoms.  He has been admitted with AKI with urology and nephrology consultation  ED Course: Temperature 99.2 blood pressure 156/82 pulse 126 respiratory 29 oxygen sats 93% room air.  White count is 16.1 hemoglobin 12.4 and platelet count of 402.  Sodium 131 potassium 6.9 chloride 104 CO2 15 BUN 91 creatinine 6.49 calcium 8.5 glucose 109.  Head CT without contrast and CT renal stone showed no acute findings.  Patient is being admitted to the hospital with urology and nephrology consult.  Review of Systems: As per HPI otherwise 10 point review of systems negative.    Past Medical History:  Diagnosis Date  . Anticoagulant long-term use    eliquis--- managed by cardiology  . Bladder tumor   . Cancer ([place])    bladder  . Cardiomyopathy [facility])     dx 2009 per cardiac cath w/ ef 20-25%;   per cardiology note   . Coronary artery disease cardiologist--- dr Ronnell Freshwater  ([facility] in [place] )   per cardiologist note dated 09-16-2019 , received via fax,  pt had cardiac cath 2009 done at [facility] that showed CTO of LAD with excellent collateralization from LCx and RCA, no PCI performed, sig. cardiomyopathy ef 20-25%;  per note stated improved to 45-50% (no documentation available in note and no echo)  . Dysrhythmia    Afib  . Hematuria   . Hyperlipidemia   . Hypertension    followed by pcp  . Nocturia   . PAF (paroxysmal atrial fibrillation) [facility]    cardiologist--- dr Ronnell Freshwater--- dx 2009    Past Surgical History:  Procedure Laterality Date  . CARDIAC CATHETERIZATION  2009   @[facility]   per pt done due to dx AFib;  told no blockage's  . CATARACT EXTRACTION W/ INTRAOCULAR LENS  IMPLANT, BILATERAL  02/2019  . CYSTOSCOPY WITH INJECTION N/A 10/27/2020   Procedure: CYSTOSCOPY WITH INJECTION OF INDOCYANINE GREEN DYE;  Surgeon: Alexis Frock, MD;  Location: [facility];  Service: Urology;  Laterality: N/A;  . LYMPH NODE DISSECTION Bilateral 10/27/2020   Procedure: LYMPH NODE DISSECTION;  Surgeon: Alexis Frock, MD;  Location: [facility];  Service: Urology;  Laterality: Bilateral;  . ROBOT ASSISTED LAPAROSCOPIC COMPLETE CYSTECT ILEAL CONDUIT N/A 10/27/2020   Procedure: XI ROBOTIC ASSISTED LAPAROSCOPIC COMPLETE CYSTECT ILEAL CONDUIT, RADICAL PROSTATECTOMY AND BILATERAL INGUINAL  HERNIA REPAIR;  Surgeon: Alexis Frock, MD;  Location: [facility];  Service: Urology;  Laterality: N/A;  6 HRS  . TRANSURETHRAL RESECTION OF BLADDER TUMOR N/A 10/29/2019   Procedure: TRANSURETHRAL RESECTION OF BLADDER TUMOR  (TURBT), CYSTOSCOPY/ INTRAVESICAL INSTILLATION OF GEMCITABINE;  Surgeon: Ceasar Mons, MD;  Location: [facility];  Service: Urology;  Laterality: N/A;  . TRANSURETHRAL  RESECTION OF BLADDER TUMOR N/A 07/14/2020   Procedure: TRANSURETHRAL RESECTION OF BLADDER TUMOR (TURBT) WITH CYSTOSCOPY/ GEMCITABINE INSTILLATION;  Surgeon: Ceasar Mons, MD;  Location: [facility];  Service: Urology;  Laterality: N/A;     reports that he quit smoking about 12 years ago. His smoking use included cigarettes. He has a 14.00 pack-year smoking history. He quit smokeless tobacco use about 12 years ago. He reports that he does not drink alcohol and does not use drugs.  No Known Allergies  No family history on file.   Prior to Admission medications   Medication Sig Start Date End Date Taking? Authorizing Provider  apixaban (ELIQUIS) 5 MG TABS tablet Take 5 mg by mouth 2 (two) times daily.   Yes [provider]  atorvastatin (LIPITOR) 10 MG tablet Take 10 mg by mouth at bedtime.   Yes [provider]  diltiazem (DILACOR XR) 240 MG 24 hr capsule Take 240 mg by mouth daily.   Yes [provider]  enalapril (VASOTEC) 20 MG tablet Take 20 mg by mouth 2 (two) times daily.   Yes [provider]  hydrALAZINE (APRESOLINE) 25 MG tablet Take 25 mg by mouth 3 (three) times daily.   Yes [provider]  metoprolol tartrate (LOPRESSOR) 50 MG tablet Take 25 mg by mouth 2 (two) times daily.   Yes [provider]  oxyCODONE-acetaminophen (PERCOCET) 5-325 MG tablet Take 1 tablet by mouth every 6 (six) hours as needed for moderate pain or severe pain. Post-operatively Patient taking differently: Take 0.5-1 tablets by mouth every 6 (six) hours as needed for moderate pain or severe pain. Post-operatively 11/02/20 11/02/21 Yes Alexis Frock, MD    Physical Exam: Vitals:   11/15/20 2000 11/15/20 2131 11/15/20 2151 11/15/20 2155  BP: 127/67 139/68 (!) 156/82   Pulse: 74 79 91   Resp: (!) 28 (!) 25 (!) 22   Temp:  98.8 F (37.1 C) 98.7 F (37.1 C)   TempSrc:  Oral Oral   SpO2: 95% 97% 98%   Weight:    119.8 kg  Height:    6' (1.829 m)       Constitutional: Morbidly obese, weak, dehydrated Vitals:   11/15/20 2000 11/15/20 2131 11/15/20 2151 11/15/20 2155  BP: 127/67 139/68 (!) 156/82   Pulse: 74 79 91   Resp: (!) 28 (!) 25 (!) 22   Temp:  98.8 F (37.1 C) 98.7 F (37.1 C)   TempSrc:  Oral Oral   SpO2: 95% 97% 98%   Weight:    119.8 kg  Height:    6' (1.829 m)   Eyes: PERRL, lids and conjunctivae normal ENMT: Mucous membranes are dry. Posterior pharynx clear of any exudate or lesions.Normal dentition.  Neck: normal, supple, no masses, no thyromegaly Respiratory: clear to auscultation bilaterally, no wheezing, some basal crackles. Normal respiratory effort. No accessory muscle use.  Cardiovascular: Regular rate and rhythm, no murmurs / rubs / gallops. No extremity edema. 2+ pedal pulses. No carotid bruits.  Abdomen: no tenderness, no masses palpated. No hepatosplenomegaly. Bowel sounds positive.  Musculoskeletal: no clubbing / cyanosis. No joint  deformity upper and lower extremities. Good ROM, no contractures. Normal muscle tone.  Skin: Diffuse ecchymosis or rashes, lesions, ulcers. No induration Neurologic: CN 2-12 grossly intact. Sensation intact, DTR normal. Strength 5/5 in all 4.  Psychiatric: Drowsy but answering questions    Labs on Admission: I have personally reviewed following labs and imaging studies  CBC: Recent Labs  Lab 11/09/20 2251 11/15/20 1427  WBC 16.2* 16.0*  NEUTROABS 14.2*  --   HGB 13.8 12.4*  HCT 43.3 37.7*  MCV 100.9* 100.3*  PLT 441* 161*   Basic Metabolic Panel: Recent Labs  Lab 11/09/20 2251 11/15/20 1427  NA 137 131*  K 5.4* 6.9*  CL 108 104  CO2 21* 15*  GLUCOSE 115* 109*  BUN 28* 91*  CREATININE 2.26* 6.49*  CALCIUM 8.7* 8.5*   GFR: Estimated Creatinine Clearance: 14.2 mL/min (A) (by C-G formula based on SCr of 6.49 mg/dL (H)). Liver Function Tests: Recent Labs  Lab 11/09/20 2251  AST 23  ALT 20  ALKPHOS 107  BILITOT 1.1  PROT 7.2  ALBUMIN 3.4*   No  results for input(s): LIPASE, AMYLASE in the last 168 hours. No results for input(s): AMMONIA in the last 168 hours. Coagulation Profile: No results for input(s): INR, PROTIME in the last 168 hours. Cardiac Enzymes: No results for input(s): CKTOTAL, CKMB, CKMBINDEX, TROPONINI in the last 168 hours. BNP (last 3 results) No results for input(s): PROBNP in the last 8760 hours. HbA1C: No results for input(s): HGBA1C in the last 72 hours. CBG: Recent Labs  Lab 11/15/20 1549 11/15/20 1702 11/15/20 1805 11/15/20 2249  GLUCAP 90 72 114* 92   Lipid Profile: No results for input(s): CHOL, HDL, LDLCALC, TRIG, CHOLHDL, LDLDIRECT in the last 72 hours. Thyroid Function Tests: No results for input(s): TSH, T4TOTAL, FREET4, T3FREE, THYROIDAB in the last 72 hours. Anemia Panel: No results for input(s): VITAMINB12, FOLATE, FERRITIN, TIBC, IRON, RETICCTPCT in the last 72 hours. Urine analysis: No results found for: COLORURINE, APPEARANCEUR, LABSPEC, PHURINE, GLUCOSEU, HGBUR, BILIRUBINUR, KETONESUR, PROTEINUR, UROBILINOGEN, NITRITE, LEUKOCYTESUR Sepsis Labs: @LABRCNTIP (procalcitonin:4,lacticidven:4) ) Recent Results (from the past 240 hour(s))  Resp Panel by RT-PCR (Flu A&B, Covid) Nasopharyngeal Swab     Status: None   Collection Time: 11/15/20  6:33 PM   Specimen: Nasopharyngeal Swab; Nasopharyngeal(NP) swabs in vial transport medium  Result Value Ref Range Status   SARS Coronavirus 2 by RT PCR NEGATIVE NEGATIVE Final    Comment: (NOTE) SARS-CoV-2 target nucleic acids are NOT DETECTED.  The SARS-CoV-2 RNA is generally detectable in upper respiratory specimens during the acute phase of infection. The lowest concentration of SARS-CoV-2 viral copies this assay can detect is 138 copies/mL. A negative result does not preclude SARS-Cov-2 infection and should not be used as the sole basis for treatment or other patient management decisions. A negative result may occur with  improper specimen  collection/handling, submission of specimen other than nasopharyngeal swab, presence of viral mutation(s) within the areas targeted by this assay, and inadequate number of viral copies(<138 copies/mL). A negative result must be combined with clinical observations, patient history, and epidemiological information. The expected result is Negative.  Fact Sheet for Patients:  EntrepreneurPulse.com.au  Fact Sheet for Healthcare Providers:  IncredibleEmployment.be  This test is no t yet approved or cleared by the [place] FDA and  has been authorized for detection and/or diagnosis of SARS-CoV-2 by FDA under an Emergency Use Authorization (EUA). This EUA will remain  in effect (meaning this test can be used) for the  duration of the COVID-19 declaration under Section 564(b)(1) of the Act, 21 U.S.C.section 360bbb-3(b)(1), unless the authorization is terminated  or revoked sooner.       Influenza A by PCR NEGATIVE NEGATIVE Final   Influenza B by PCR NEGATIVE NEGATIVE Final    Comment: (NOTE) The Xpert Xpress SARS-CoV-2/FLU/RSV plus assay is intended as an aid in the diagnosis of influenza from Nasopharyngeal swab specimens and should not be used as a sole basis for treatment. Nasal washings and aspirates are unacceptable for Xpert Xpress SARS-CoV-2/FLU/RSV testing.  Fact Sheet for Patients: EntrepreneurPulse.com.au  Fact Sheet for Healthcare Providers: IncredibleEmployment.be  This test is not yet approved or cleared by the [place] FDA and has been authorized for detection and/or diagnosis of SARS-CoV-2 by FDA under an Emergency Use Authorization (EUA). This EUA will remain in effect (meaning this test can be used) for the duration of the COVID-19 declaration under Section 564(b)(1) of the Act, 21 U.S.C. section 360bbb-3(b)(1), unless the authorization is terminated or revoked.  Performed at [facility], [place] [address], [place], [place] [address]      Radiological Exams on Admission: DG Chest 2 View  Result Date: 11/15/2020 CLINICAL DATA:  Chest pain EXAM: CHEST - 2 VIEW COMPARISON:  None. FINDINGS: Cardiomegaly. Pulmonary vascular congestion. No pleural effusion or pneumothorax. No acute osseous abnormality. IMPRESSION: Pulmonary vascular congestion and cardiomegaly. Electronically Signed   By: Macy Mis M.D.   On: 11/15/2020 15:09   CT Head Wo Contrast  Result Date: 11/15/2020 CLINICAL DATA:  70 year old male with concern for intracranial hemorrhage. EXAM: CT HEAD WITHOUT CONTRAST TECHNIQUE: Contiguous axial images were obtained from the base of the skull through the vertex without intravenous contrast. COMPARISON:  None. FINDINGS: Brain: Mild age-related atrophy and chronic microvascular ischemic changes. There is no acute intracranial hemorrhage. No mass effect or midline shift. No extra-axial fluid collection. Vascular: No hyperdense vessel or unexpected calcification. Skull: Normal. Negative for fracture or focal lesion. Sinuses/Orbits: No acute finding. Other: None IMPRESSION: 1. No acute intracranial pathology. 2. Mild age-related atrophy and chronic microvascular ischemic changes. Electronically Signed   By: Anner Crete M.D.   On: 11/15/2020 16:57   CT Renal Stone Study  Result Date: 11/15/2020 CLINICAL DATA:  Flank pain. Urostomy 3 weeks ago. Bladder cancer. Hematuria. EXAM: CT ABDOMEN AND PELVIS WITHOUT CONTRAST TECHNIQUE: Multidetector CT imaging of the abdomen and pelvis was performed following the standard protocol without IV contrast. COMPARISON:  11/09/2020 FINDINGS: Lower chest: Bibasilar scarring. Mild cardiomegaly, without pericardial or pleural effusion. Right coronary artery calcification. Hepatobiliary: Normal noncontrast appearance of the liver. Stones in the gallbladder without acute cholecystitis or biliary duct dilatation. Pancreas:  Normal, without mass or ductal dilatation. Spleen: Normal in size, without focal abnormality. Adrenals/Urinary Tract: Normal adrenal glands. Bilateral ureteric stents again identified. The left-sided stent originates in an extrarenal pelvis. No renal calculi or hydronephrosis. Cystectomy and ileal conduit creation. 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 Stomach/Bowel: Normal stomach, without wall thickening. Descending duodenal diverticulum. Otherwise normal small bowel. Extensive colonic diverticulosis. Normal terminal ileum and appendix. Otherwise normal small bowel. Vascular/Lymphatic: Aortic atherosclerosis. No abdominopelvic adenopathy. Reproductive: Prostatectomy. Other: Small volume perisplenic fluid is slightly increased. Pelvic interloop mesenteric fluid is minimally increased, including on 67/2. Pelvic cul-de-sac fluid is similar. Again identified is fluid within a right inguinal hernia. Fat containing tiny left inguinal hernia. No free intraperitoneal air. Again identified is extensive subcutaneous emphysema about the abdominopelvic and lower thoracic walls. Similar in distribution, slightly  decreased. Musculoskeletal: Left hip osteoarthritis. IMPRESSION: 1. Status post cystoprostatectomy with ileal conduit creation. Similar appearance of ureteric stents in place, without urinary tract calculi or hydronephrosis. 2. Similar to slight increase in small volume abdominopelvic fluid. Minimal decrease in extensive subcutaneous emphysema. 3. Right-sided fluid containing and left-sided fat containing inguinal hernias, similar. 4. Coronary artery atherosclerosis. Aortic Atherosclerosis (ICD10-I70.0). Electronically Signed   By: Abigail Miyamoto M.D.   On: 11/15/2020 17:09      Assessment/Plan Principal Problem:   AKI (acute kidney injury) ([place]) Active Problems:   Bladder cancer ([facility])   PAF (paroxysmal atrial fibrillation) ([facility])   Hyperkalemia   Hyperlipidemia     #1 acute kidney injury: It  appears due to kidney injury directly.  No obvious obstructive symptoms.  Patient has been on ACE inhibitor.  No NSAID use.  Also postoperative.  We will admit the patient.  Hydrate the patient.  Nephrology consult and will follow nephrology recommendations.  #2  Hyperkalemia: Initial treatment in the ED included D50 and insulin as well as bicarbonate.  Recheck potassium level.  #3 bladder cancer: Status post recent surgery.  Urology consulted.  CT does not show any significant obstruction.  #4 paroxysmal atrial fibrillation: Continue Eliquis and beta-blockers  #5 hyperlipidemia: Continue home regimen   DVT prophylaxis: Eliquis Code Status: Full code Family Communication: No family at bedside Disposition Plan: Home Consults called: Dr. Joelyn Oms, nephrology.  Dr. Tresa Moore, urology Admission status: Inpatient  Severity of Illness: The appropriate patient status for this patient is INPATIENT. Inpatient status is judged to be reasonable and necessary in order to provide the required intensity of service to ensure the patient's safety. The patient's presenting symptoms, physical exam findings, and initial radiographic and laboratory data in the context of their chronic comorbidities is felt to place them at high risk for further clinical deterioration. Furthermore, it is not anticipated that the patient will be medically stable for discharge from the hospital within 2 midnights of admission. The following factors support the patient status of inpatient.   " The patient's presenting symptoms include generalized weakness. " The worrisome physical exam findings include altered mental status with weakness. " The initial radiographic and laboratory data are worrisome because of creatinine of 6.6. " The chronic co-morbidities include history of bladder cancer.   * I certify that at the point of admission it is my clinical judgment that the patient will require inpatient hospital care spanning beyond 2  midnights from the point of admission due to high intensity of service, high risk for further deterioration and high frequency of surveillance required.Barbette Merino MD [facility] Pager [phone]  If 7PM-7AM, please contact night-coverage [email or web address] Password [email or web address]  11/15/2020, 11:58 PM

## 2020-11-15 NOTE — ED Notes (Signed)
Verified with ED provider, Dr. Zenia Resides, pt's POC BG of 90 and elevated Potassium of 6.7. Per provider's written order, IV Dextrose given, followed by 10 units IV insulin, and 1,061mL bolus of lactated ringers. Will continue to monitor.

## 2020-11-15 NOTE — ED Notes (Signed)
IV dextrose, second dose to be given, per provider's written order.

## 2020-11-16 ENCOUNTER — Inpatient Hospital Stay (HOSPITAL_COMMUNITY): Payer: Medicare Other

## 2020-11-16 ENCOUNTER — Other Ambulatory Visit: Payer: Self-pay

## 2020-11-16 DIAGNOSIS — N179 Acute kidney failure, unspecified: Secondary | ICD-10-CM | POA: Diagnosis not present

## 2020-11-16 LAB — CBC
HCT: 33.5 % — ABNORMAL LOW (ref 39.0–52.0)
Hemoglobin: 11 g/dL — ABNORMAL LOW (ref 13.0–17.0)
MCH: 32.5 pg (ref 26.0–34.0)
MCHC: 32.8 g/dL (ref 30.0–36.0)
MCV: 99.1 fL (ref 80.0–100.0)
Platelets: 350 10*3/uL (ref 150–400)
RBC: 3.38 MIL/uL — ABNORMAL LOW (ref 4.22–5.81)
RDW: 15.3 % (ref 11.5–15.5)
WBC: 11.8 10*3/uL — ABNORMAL HIGH (ref 4.0–10.5)
nRBC: 0 % (ref 0.0–0.2)

## 2020-11-16 LAB — GLUCOSE, CAPILLARY
Glucose-Capillary: 110 mg/dL — ABNORMAL HIGH (ref 70–99)
Glucose-Capillary: 100 mg/dL — ABNORMAL HIGH (ref 70–99)
Glucose-Capillary: 104 mg/dL — ABNORMAL HIGH (ref 70–99)
Glucose-Capillary: 105 mg/dL — ABNORMAL HIGH (ref 70–99)

## 2020-11-16 LAB — BASIC METABOLIC PANEL
Anion gap: 7 (ref 5–15)
Anion gap: 10 (ref 5–15)
BUN: 94 mg/dL — ABNORMAL HIGH (ref 8–23)
BUN: 90 mg/dL — ABNORMAL HIGH (ref 8–23)
CO2: 19 mmol/L — ABNORMAL LOW (ref 22–32)
CO2: 19 mmol/L — ABNORMAL LOW (ref 22–32)
Calcium: 7.9 mg/dL — ABNORMAL LOW (ref 8.9–10.3)
Calcium: 7.9 mg/dL — ABNORMAL LOW (ref 8.9–10.3)
Chloride: 102 mmol/L (ref 98–111)
Chloride: 100 mmol/L (ref 98–111)
Creatinine, Ser: 5.88 mg/dL — ABNORMAL HIGH (ref 0.61–1.24)
Creatinine, Ser: 5.12 mg/dL — ABNORMAL HIGH (ref 0.61–1.24)
GFR, Estimated: 10 mL/min — ABNORMAL LOW (ref >60)
GFR, Estimated: 11 mL/min — ABNORMAL LOW (ref >60)
Glucose, Bld: 122 mg/dL — ABNORMAL HIGH (ref 70–99)
Glucose, Bld: 100 mg/dL — ABNORMAL HIGH (ref 70–99)
Potassium: 5.7 mmol/L — ABNORMAL HIGH (ref 3.5–5.1)
Potassium: 5.3 mmol/L — ABNORMAL HIGH (ref 3.5–5.1)
Sodium: 128 mmol/L — ABNORMAL LOW (ref 135–145)
Sodium: 129 mmol/L — ABNORMAL LOW (ref 135–145)

## 2020-11-16 LAB — CREATININE, SERUM
Creatinine, Ser: 5.82 mg/dL — ABNORMAL HIGH (ref 0.61–1.24)
GFR, Estimated: 10 mL/min — ABNORMAL LOW (ref >60)

## 2020-11-16 LAB — HEMOGLOBIN A1C
Hgb A1c MFr Bld: 5.3 % (ref 4.8–5.6)
Mean Plasma Glucose: 105.41 mg/dL

## 2020-11-16 LAB — CK: Total CK: 82 U/L (ref 49–397)

## 2020-11-16 MED ORDER — SODIUM ZIRCONIUM CYCLOSILICATE 5 G PO PACK
5.0000 g | PACK | Freq: Once | ORAL | Status: AC
  Administered 2020-11-16: 5 g via ORAL
  Filled 2020-11-16: qty 1

## 2020-11-16 MED ORDER — OXYCODONE-ACETAMINOPHEN 5-325 MG PO TABS
0.5000 | ORAL_TABLET | ORAL | Status: DC | PRN
  Administered 2020-11-16 – 2020-11-17 (×4): 1 via ORAL
  Filled 2020-11-16 (×3): qty 1

## 2020-11-16 MED ORDER — DOCUSATE SODIUM 100 MG PO CAPS
100.0000 mg | ORAL_CAPSULE | Freq: Every day | ORAL | Status: DC
  Administered 2020-11-16 – 2020-11-25 (×10): 100 mg via ORAL
  Filled 2020-11-16 (×11): qty 1

## 2020-11-16 MED ORDER — SODIUM ZIRCONIUM CYCLOSILICATE 10 G PO PACK
10.0000 g | PACK | Freq: Once | ORAL | Status: AC
  Administered 2020-11-16: 10 g via ORAL
  Filled 2020-11-16: qty 1

## 2020-11-16 NOTE — Consult Note (Signed)
Reason for Consult: Bladder Cancer, Prostate Cancer, Urinary Diversion, Acute Renal Failure  Referring Physician: Niel Hummer MD  Meda Klinefelter Ranson Holthaus. is an 70 y.o. male.   HPI:   1 - Bladder Cancer - s/p robotic cystoprostatectomy with node dissection and conduit diversion 10/27/20 for [id number] recurrent high grade bladder cancer with negative margins.   2 - Prostate Cancer - incidetnal grade 1 cancer with negative margins at cystectomy 10/2020  3 - Urinary Diversion - s/ p conduit diversion as part of bladder cancer surgery 10/2020 with bricker type (refluxing) anastamoses  4 -  Acute Renal Failure - Cr 6s with K 6 by ER labs 11/15/20. CT without hydro and bilateral bander stents in good position. Baselin Cr <1.1 and JP Cr same as serum before removal after recent cystectomy. Reports very poor PO intake  5 - Suspect Early Pyelonephritis - subjectgvie malaise and bilateral flank pain x few days prior to admission. CT mild perinephric stranding. Mild leukocytosis concerning for possible early pyelo. Lenna Gilford 4/27 pending. Started on empiric ceftriaxone 4/27.   PMH sig for AFib/DVT/ELiquus (follows [facility] with [facility], has clearnce to stop eliqus 48 hours prior surgery), morbid obesity, hip OA (possible replacement pending). He is retired from [employer], then [employer] in [place]. His daughter Eustaquio Maize is very involved and his POA. His PCP is Chesley Noon DO with [facility] in [place].   Today "Akira" is seen in consultation for ARF about 2 weeks after major abdominal surgery.     Past Medical History:  Diagnosis Date  . Anticoagulant long-term use    eliquis--- managed by cardiology  . Bladder tumor   . Cancer ([facility])    bladder  . Cardiomyopathy [facility])    dx 2009 per cardiac cath w/ ef 20-25%;   per cardiology note   . Coronary artery disease cardiologist--- dr Ronnell Freshwater  ([facility] in [place] )   per cardiologist  note dated 09-16-2019 , received via fax,  pt had cardiac cath 2009 done at [facility] that showed CTO of LAD with excellent collateralization from LCx and RCA, no PCI performed, sig. cardiomyopathy ef 20-25%;  per note stated improved to 45-50% (no documentation available in note and no echo)  . Dysrhythmia    Afib  . Hematuria   . Hyperlipidemia   . Hypertension    followed by pcp  . Nocturia   . PAF (paroxysmal atrial fibrillation) [facility])    cardiologist--- dr Ronnell Freshwater--- dx 2009    Past Surgical History:  Procedure Laterality Date  . CARDIAC CATHETERIZATION  2009   @[facility]   per pt done due to dx AFib;  told no blockage's  . CATARACT EXTRACTION W/ INTRAOCULAR LENS  IMPLANT, BILATERAL  02/2019  . CYSTOSCOPY WITH INJECTION N/A 10/27/2020   Procedure: CYSTOSCOPY WITH INJECTION OF INDOCYANINE GREEN DYE;  Surgeon: Alexis Frock, MD;  Location: [facility];  Service: Urology;  Laterality: N/A;  . LYMPH NODE DISSECTION Bilateral 10/27/2020   Procedure: LYMPH NODE DISSECTION;  Surgeon: Alexis Frock, MD;  Location: [facility];  Service: Urology;  Laterality: Bilateral;  . ROBOT ASSISTED LAPAROSCOPIC COMPLETE CYSTECT ILEAL CONDUIT N/A 10/27/2020   Procedure: XI ROBOTIC ASSISTED LAPAROSCOPIC COMPLETE CYSTECT ILEAL CONDUIT, RADICAL PROSTATECTOMY AND BILATERAL INGUINAL HERNIA REPAIR;  Surgeon: Alexis Frock, MD;  Location: [facility];  Service: Urology;  Laterality: N/A;  6 HRS  . TRANSURETHRAL RESECTION OF BLADDER TUMOR N/A 10/29/2019   Procedure: TRANSURETHRAL RESECTION OF BLADDER TUMOR  (TURBT),  CYSTOSCOPY/ INTRAVESICAL INSTILLATION OF GEMCITABINE;  Surgeon: Ceasar Mons, MD;  [facility];  Service: Urology;  Laterality: N/A;  . TRANSURETHRAL RESECTION OF BLADDER TUMOR N/A 07/14/2020   Procedure: TRANSURETHRAL RESECTION OF BLADDER TUMOR (TURBT) WITH CYSTOSCOPY/ GEMCITABINE INSTILLATION;  Surgeon: Ceasar Mons, MD;  Location:  [facility];  Service: Urology;  Laterality: N/A;    No family history on file.  Social History:  reports that he quit smoking about 12 years ago. His smoking use included cigarettes. He has a 14.00 pack-year smoking history. He quit smokeless tobacco use about 12 years ago. He reports that he does not drink alcohol and does not use drugs.  Allergies: No Known Allergies  Medications: I have reviewed the patient's current medications.  Results for orders placed or performed during the hospital encounter of 11/15/20 (from the past 48 hour(s))  Basic metabolic panel     Status: Abnormal   Collection Time: 11/15/20  2:27 PM  Result Value Ref Range   Sodium 131 (L) 135 - 145 mmol/L   Potassium 6.9 (HH) 3.5 - 5.1 mmol/L    Comment: REPEATED TO VERIFY NO VISIBLE HEMOLYSIS CRITICAL RESULT CALLED TO, READ BACK BY AND VERIFIED WITH: P.DOWD, RN AT 1516 ON 04.25.22 BY N.THOMPSON    Chloride 104 98 - 111 mmol/L   CO2 15 (L) 22 - 32 mmol/L   Glucose, Bld 109 (H) 70 - 99 mg/dL    Comment: Glucose reference range applies only to samples taken after fasting for at least 8 hours.   BUN 91 (H) 8 - 23 mg/dL   Creatinine, Ser 6.49 (H) 0.61 - 1.24 mg/dL   Calcium 8.5 (L) 8.9 - 10.3 mg/dL   GFR, Estimated 9 (L) >60 mL/min    Comment: (NOTE) Calculated using the CKD-EPI Creatinine Equation (2021)    Anion gap 12 5 - 15    Comment: Performed at [facility], [place] [address] [place], [place] [address]  CBC     Status: Abnormal   Collection Time: 11/15/20  2:27 PM  Result Value Ref Range   WBC 16.0 (H) 4.0 - 10.5 K/uL   RBC 3.76 (L) 4.22 - 5.81 MIL/uL   Hemoglobin 12.4 (L) 13.0 - 17.0 g/dL   HCT 37.7 (L) 39.0 - 52.0 %   MCV 100.3 (H) 80.0 - 100.0 fL   MCH 33.0 26.0 - 34.0 pg   MCHC 32.9 30.0 - 36.0 g/dL   RDW 15.2 11.5 - 15.5 %   Platelets 402 (H) 150 - 400 K/uL   nRBC 0.0 0.0 - 0.2 %    Comment: Performed at [facility], [place] [address], [place], [place]  [address]  Troponin I (High Sensitivity)     Status: Abnormal   Collection Time: 11/15/20  2:27 PM  Result Value Ref Range   Troponin I (High Sensitivity) 21 (H) <18 ng/L    Comment: (NOTE) Elevated high sensitivity troponin I (hsTnI) values and significant  changes across serial measurements may suggest ACS but many other  chronic and acute conditions are known to elevate hsTnI results.  Refer to the Links section for chest pain algorithms and additional  guidance. Performed at [facility], [place] [address], [place], [place] [address]   CBG monitoring, ED     Status: None   Collection Time: 11/15/20  3:49 PM  Result Value Ref Range   Glucose-Capillary 90 70 - 99 mg/dL    Comment: Glucose reference range  applies only to samples taken after fasting for at least 8 hours.  Troponin I (High Sensitivity)     Status: Abnormal   Collection Time: 11/15/20  4:12 PM  Result Value Ref Range   Troponin I (High Sensitivity) 21 (H) <18 ng/L    Comment: (NOTE) Elevated high sensitivity troponin I (hsTnI) values and significant  changes across serial measurements may suggest ACS but many other  chronic and acute conditions are known to elevate hsTnI results.  Refer to the "Links" section for chest pain algorithms and additional  guidance. Performed at [facility], [place] [address], [place], [place] [address]   CBG monitoring, ED     Status: None   Collection Time: 11/15/20  5:02 PM  Result Value Ref Range   Glucose-Capillary 72 70 - 99 mg/dL    Comment: Glucose reference range applies only to samples taken after fasting for at least 8 hours.  CBG monitoring, ED     Status: Abnormal   Collection Time: 11/15/20  6:05 PM  Result Value Ref Range   Glucose-Capillary 114 (H) 70 - 99 mg/dL    Comment: Glucose reference range applies only to samples taken after fasting for at least 8 hours.  Resp Panel by RT-PCR (Flu A&B, Covid) Nasopharyngeal Swab     Status: None    Collection Time: 11/15/20  6:33 PM   Specimen: Nasopharyngeal Swab; Nasopharyngeal(NP) swabs in vial transport medium  Result Value Ref Range   SARS Coronavirus 2 by RT PCR NEGATIVE NEGATIVE    Comment: (NOTE) SARS-CoV-2 target nucleic acids are NOT DETECTED.  The SARS-CoV-2 RNA is generally detectable in upper respiratory specimens during the acute phase of infection. The lowest concentration of SARS-CoV-2 viral copies this assay can detect is 138 copies/mL. A negative result does not preclude SARS-Cov-2 infection and should not be used as the sole basis for treatment or other patient management decisions. A negative result may occur with  improper specimen collection/handling, submission of specimen other than nasopharyngeal swab, presence of viral mutation(s) within the areas targeted by this assay, and inadequate number of viral copies(<138 copies/mL). A negative result must be combined with clinical observations, patient history, and epidemiological information. The expected result is Negative.  Fact Sheet for Patients:  EntrepreneurPulse.com.au  Fact Sheet for Healthcare Providers:  IncredibleEmployment.be  This test is no t yet approved or cleared by the [place] FDA and  has been authorized for detection and/or diagnosis of SARS-CoV-2 by FDA under an Emergency Use Authorization (EUA). This EUA will remain  in effect (meaning this test can be used) for the duration of the COVID-19 declaration under Section 564(b)(1) of the Act, 21 U.S.C.section 360bbb-3(b)(1), unless the authorization is terminated  or revoked sooner.       Influenza A by PCR NEGATIVE NEGATIVE   Influenza B by PCR NEGATIVE NEGATIVE    Comment: (NOTE) The Xpert Xpress SARS-CoV-2/FLU/RSV plus assay is intended as an aid in the diagnosis of influenza from Nasopharyngeal swab specimens and should not be used as a sole basis for treatment. Nasal washings  and aspirates are unacceptable for Xpert Xpress SARS-CoV-2/FLU/RSV testing.  Fact Sheet for Patients: EntrepreneurPulse.com.au  Fact Sheet for Healthcare Providers: IncredibleEmployment.be  This test is not yet approved or cleared by the [place] FDA and has been authorized for detection and/or diagnosis of SARS-CoV-2 by FDA under an Emergency Use Authorization (EUA). This EUA will remain in effect (meaning this test can be used) for the duration of the  COVID-19 declaration under Section 564(b)(1) of the Act, 21 U.S.C. section 360bbb-3(b)(1), unless the authorization is terminated or revoked.  Performed at [facility], [place] [address], [place], [place] [address]   Glucose, capillary     Status: None   Collection Time: 11/15/20 10:49 PM  Result Value Ref Range   Glucose-Capillary 92 70 - 99 mg/dL    Comment: Glucose reference range applies only to samples taken after fasting for at least 8 hours.  Basic metabolic panel     Status: Abnormal   Collection Time: 11/16/20  5:44 AM  Result Value Ref Range   Sodium 128 (L) 135 - 145 mmol/L   Potassium 5.7 (H) 3.5 - 5.1 mmol/L    Comment: DELTA CHECK NOTED   Chloride 102 98 - 111 mmol/L   CO2 19 (L) 22 - 32 mmol/L   Glucose, Bld 122 (H) 70 - 99 mg/dL    Comment: Glucose reference range applies only to samples taken after fasting for at least 8 hours.   BUN 94 (H) 8 - 23 mg/dL   Creatinine, Ser 5.88 (H) 0.61 - 1.24 mg/dL   Calcium 7.9 (L) 8.9 - 10.3 mg/dL   GFR, Estimated 10 (L) >60 mL/min    Comment: (NOTE) Calculated using the CKD-EPI Creatinine Equation (2021)    Anion gap 7 5 - 15    Comment: Performed at [facility], [place] [address], [place], [place] [address]  CBC     Status: Abnormal   Collection Time: 11/16/20  5:44 AM  Result Value Ref Range   WBC 11.8 (H) 4.0 - 10.5 K/uL   RBC 3.38 (L) 4.22 - 5.81 MIL/uL   Hemoglobin 11.0 (L) 13.0 - 17.0  g/dL   HCT 33.5 (L) 39.0 - 52.0 %   MCV 99.1 80.0 - 100.0 fL   MCH 32.5 26.0 - 34.0 pg   MCHC 32.8 30.0 - 36.0 g/dL   RDW 15.3 11.5 - 15.5 %   Platelets 350 150 - 400 K/uL   nRBC 0.0 0.0 - 0.2 %    Comment: Performed at [facility], [place] [address], [place], [place] [address]  Creatinine, serum     Status: Abnormal   Collection Time: 11/16/20  5:44 AM  Result Value Ref Range   Creatinine, Ser 5.82 (H) 0.61 - 1.24 mg/dL   GFR, Estimated 10 (L) >60 mL/min    Comment: (NOTE) Calculated using the CKD-EPI Creatinine Equation (2021) Performed at [facility], [place] [address] [place], [place] [address]   Hemoglobin A1c     Status: None   Collection Time: 11/16/20  5:44 AM  Result Value Ref Range   Hgb A1c MFr Bld 5.3 4.8 - 5.6 %    Comment: (NOTE) Pre diabetes:          5.7%-6.4%  Diabetes:              >6.4%  Glycemic control for   <7.0% adults with diabetes    Mean Plasma Glucose 105.41 mg/dL    Comment: Performed at [place] [address] [place], [place] [address]  Glucose, capillary     Status: Abnormal   Collection Time: 11/16/20  7:18 AM  Result Value Ref Range   Glucose-Capillary 110 (H) 70 - 99 mg/dL    Comment: Glucose reference range applies only to samples taken after fasting for at least 8 hours.  Glucose, capillary     Status: Abnormal  Collection Time: 11/16/20 11:16 AM  Result Value Ref Range   Glucose-Capillary 100 (H) 70 - 99 mg/dL    Comment: Glucose reference range applies only to samples taken after fasting for at least 8 hours.    DG Chest 2 View  Result Date: 11/15/2020 CLINICAL DATA:  Chest pain EXAM: CHEST - 2 VIEW COMPARISON:  None. FINDINGS: Cardiomegaly. Pulmonary vascular congestion. No pleural effusion or pneumothorax. No acute osseous abnormality. IMPRESSION: Pulmonary vascular congestion and cardiomegaly. Electronically Signed   By: Macy Mis M.D.   On: 11/15/2020 15:09   CT Head Wo  Contrast  Result Date: 11/15/2020 CLINICAL DATA:  70 year old male with concern for intracranial hemorrhage. EXAM: CT HEAD WITHOUT CONTRAST TECHNIQUE: Contiguous axial images were obtained from the base of the skull through the vertex without intravenous contrast. COMPARISON:  None. FINDINGS: Brain: Mild age-related atrophy and chronic microvascular ischemic changes. There is no acute intracranial hemorrhage. No mass effect or midline shift. No extra-axial fluid collection. Vascular: No hyperdense vessel or unexpected calcification. Skull: Normal. Negative for fracture or focal lesion. Sinuses/Orbits: No acute finding. Other: None IMPRESSION: 1. No acute intracranial pathology. 2. Mild age-related atrophy and chronic microvascular ischemic changes. Electronically Signed   By: Anner Crete M.D.   On: 11/15/2020 16:57   CT Renal Stone Study  Result Date: 11/15/2020 CLINICAL DATA:  Flank pain. Urostomy 3 weeks ago. Bladder cancer. Hematuria. EXAM: CT ABDOMEN AND PELVIS WITHOUT CONTRAST TECHNIQUE: Multidetector CT imaging of the abdomen and pelvis was performed following the standard protocol without IV contrast. COMPARISON:  11/09/2020 FINDINGS: Lower chest: Bibasilar scarring. Mild cardiomegaly, without pericardial or pleural effusion. Right coronary artery calcification. Hepatobiliary: Normal noncontrast appearance of the liver. Stones in the gallbladder without acute cholecystitis or biliary duct dilatation. Pancreas: Normal, without mass or ductal dilatation. Spleen: Normal in size, without focal abnormality. Adrenals/Urinary Tract: Normal adrenal glands. Bilateral ureteric stents again identified. The left-sided stent originates in an extrarenal pelvis. No renal calculi or hydronephrosis. Cystectomy and ileal conduit creation. 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 Stomach/Bowel: Normal stomach, without wall thickening. Descending duodenal diverticulum. Otherwise normal small bowel. Extensive  colonic diverticulosis. Normal terminal ileum and appendix. Otherwise normal small bowel. Vascular/Lymphatic: Aortic atherosclerosis. No abdominopelvic adenopathy. Reproductive: Prostatectomy. Other: Small volume perisplenic fluid is slightly increased. Pelvic interloop mesenteric fluid is minimally increased, including on 67/2. Pelvic cul-de-sac fluid is similar. Again identified is fluid within a right inguinal hernia. Fat containing tiny left inguinal hernia. No free intraperitoneal air. Again identified is extensive subcutaneous emphysema about the abdominopelvic and lower thoracic walls. Similar in distribution, slightly decreased. Musculoskeletal: Left hip osteoarthritis. IMPRESSION: 1. Status post cystoprostatectomy with ileal conduit creation. Similar appearance of ureteric stents in place, without urinary tract calculi or hydronephrosis. 2. Similar to slight increase in small volume abdominopelvic fluid. Minimal decrease in extensive subcutaneous emphysema. 3. Right-sided fluid containing and left-sided fat containing inguinal hernias, similar. 4. Coronary artery atherosclerosis. Aortic Atherosclerosis (ICD10-I70.0). Electronically Signed   By: Abigail Miyamoto M.D.   On: 11/15/2020 17:09    Review of Systems  Constitutional: Positive for fatigue.  Genitourinary: Positive for flank pain.  All other systems reviewed and are negative.  Blood pressure 136/81, pulse 83, temperature 99.3 F (37.4 C), temperature source Oral, resp. rate 18, height 6' (1.829 m), weight 119.8 kg, SpO2 96 %. Physical Exam Vitals reviewed.  Constitutional:      Comments: Pleasant, but stigmata of chronic disesae. Daughter at bedside.   HENT:  Mouth/Throat:     Mouth: Mucous membranes are moist.  Eyes:     Pupils: Pupils are equal, round, and reactive to light.  Cardiovascular:     Rate and Rhythm: Normal rate.  Pulmonary:     Effort: Pulmonary effort is normal.  Abdominal:     Comments: Stable large truncal  obesity. Recent surgical sites w/o hernias. Very subtle blanching erythema at midline c/w possible early cellulits. RLQ Urostomy with opque appliance in place.   Skin:    General: Skin is warm.  Psychiatric:        Mood and Affect: Mood normal.     Assessment/Plan:  1 - Bladder Cancer - very good long term prognosis after cystectomy.   2 - Prostate Cancer - very good long term prognosis after prostatectomy.  3 - Urinary Diversion - no hydor to suggest any sort of acute obstruction. Keep current bander renal stents in place for now as these help ensure no obstructive etiology to his ARF.   4 -  Acute Renal Failure - Suspect mostly pre-renal dysfuncaiton related to severe low PO intake, possibly exacerbated by early pyelo.  5 - Suspect Pyelonephritis - BCX, UCX today, begin empiric ceftriaxone.   Should this not improve with hydration and conservative measures over next few days, consider sampling abdomial fluid or conduit loop-o-gram to max r/o any sort of urine leak (would be quite rare given favorable fluid sampling previously and with stents still in place.   Alexis Frock 11/16/2020, 12:04 PM

## 2020-11-16 NOTE — Progress Notes (Signed)
OT Cancellation Note  Patient Details Name: Zachary Shannon. MRN: [id number] DOB: 1951/01/20   Cancelled Treatment:    Reason Eval/Treat Not Completed: Patient declined, no reason specified Patient received pain medication around noon and reports finally getting comfortable does not want to get out of bed. Will re-attempt 4/27 as schedule permits.  Delbert Phenix OT OT pager: [phone] 11/16/2020, 1:31 PM

## 2020-11-16 NOTE — Progress Notes (Addendum)
[facility] Progress Note   Subjective:   Eval bedside, 2 daughters present.  Doing a bit better - able to eat breakfast this AM.  No new issues.   Objective Vitals:   11/15/20 2151 11/15/20 2155 11/16/20 0445 11/16/20 1028  BP: (!) 156/82  94/66 136/81  Pulse: 91  68 83  Resp: (!) 22  18   Temp: 98.7 F (37.1 C)  99.3 F (37.4 C)   TempSrc: Oral  Oral   SpO2: 98%  96%   Weight:  119.8 kg    Height:  6' (1.829 m)     Physical Exam General: obese man lying at 44 comfortably ENT:  MM dry Heart: RRR, no rub Lungs: clear Abdomen: soft, RLQ urostomy present, midline periumbilical incision c/d/i Extremities: no edema Neuro:  AOx3, conversant, no asterixis  Additional Objective Labs: Basic Metabolic Panel: Recent Labs  Lab 11/09/20 2251 11/15/20 1427 11/16/20 0544  NA 137 131* 128*  K 5.4* 6.9* 5.7*  CL 108 104 102  CO2 21* 15* 19*  GLUCOSE 115* 109* 122*  BUN 28* 91* 94*  CREATININE 2.26* 6.49* 5.88*  5.82*  CALCIUM 8.7* 8.5* 7.9*   Liver Function Tests: Recent Labs  Lab 11/09/20 2251  AST 23  ALT 20  ALKPHOS 107  BILITOT 1.1  PROT 7.2  ALBUMIN 3.4*   No results for input(s): LIPASE, AMYLASE in the last 168 hours. CBC: Recent Labs  Lab 11/09/20 2251 11/15/20 1427 11/16/20 0544  WBC 16.2* 16.0* 11.8*  NEUTROABS 14.2*  --   --   HGB 13.8 12.4* 11.0*  HCT 43.3 37.7* 33.5*  MCV 100.9* 100.3* 99.1  PLT 441* 402* 350   Blood Culture No results found for: SDES, SPECREQUEST, CULT, REPTSTATUS  Cardiac Enzymes: No results for input(s): CKTOTAL, CKMB, CKMBINDEX, TROPONINI in the last 168 hours. CBG: Recent Labs  Lab 11/15/20 1702 11/15/20 1805 11/15/20 2249 11/16/20 0718 11/16/20 1116  GLUCAP 72 114* 92 110* 100*   Iron Studies: No results for input(s): IRON, TIBC, TRANSFERRIN, FERRITIN in the last 72 hours. @lablastinr3 @ Studies/Results: DG Chest 2 View  Result Date: 11/15/2020 CLINICAL DATA:  Chest pain EXAM: CHEST - 2 VIEW  COMPARISON:  None. FINDINGS: Cardiomegaly. Pulmonary vascular congestion. No pleural effusion or pneumothorax. No acute osseous abnormality. IMPRESSION: Pulmonary vascular congestion and cardiomegaly. Electronically Signed   By: Macy Mis M.D.   On: 11/15/2020 15:09   CT Head Wo Contrast  Result Date: 11/15/2020 CLINICAL DATA:  70 year old male with concern for intracranial hemorrhage. EXAM: CT HEAD WITHOUT CONTRAST TECHNIQUE: Contiguous axial images were obtained from the base of the skull through the vertex without intravenous contrast. COMPARISON:  None. FINDINGS: Brain: Mild age-related atrophy and chronic microvascular ischemic changes. There is no acute intracranial hemorrhage. No mass effect or midline shift. No extra-axial fluid collection. Vascular: No hyperdense vessel or unexpected calcification. Skull: Normal. Negative for fracture or focal lesion. Sinuses/Orbits: No acute finding. Other: None IMPRESSION: 1. No acute intracranial pathology. 2. Mild age-related atrophy and chronic microvascular ischemic changes. Electronically Signed   By: Anner Crete M.D.   On: 11/15/2020 16:57   CT Renal Stone Study  Result Date: 11/15/2020 CLINICAL DATA:  Flank pain. Urostomy 3 weeks ago. Bladder cancer. Hematuria. EXAM: CT ABDOMEN AND PELVIS WITHOUT CONTRAST TECHNIQUE: Multidetector CT imaging of the abdomen and pelvis was performed following the standard protocol without IV contrast. COMPARISON:  11/09/2020 FINDINGS: Lower chest: Bibasilar scarring. Mild cardiomegaly, without pericardial or pleural effusion. Right coronary  artery calcification. Hepatobiliary: Normal noncontrast appearance of the liver. Stones in the gallbladder without acute cholecystitis or biliary duct dilatation. Pancreas: Normal, without mass or ductal dilatation. Spleen: Normal in size, without focal abnormality. Adrenals/Urinary Tract: Normal adrenal glands. Bilateral ureteric stents again identified. The left-sided stent  originates in an extrarenal pelvis. No renal calculi or hydronephrosis. Cystectomy and ileal conduit creation. 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 Stomach/Bowel: Normal stomach, without wall thickening. Descending duodenal diverticulum. Otherwise normal small bowel. Extensive colonic diverticulosis. Normal terminal ileum and appendix. Otherwise normal small bowel. Vascular/Lymphatic: Aortic atherosclerosis. No abdominopelvic adenopathy. Reproductive: Prostatectomy. Other: Small volume perisplenic fluid is slightly increased. Pelvic interloop mesenteric fluid is minimally increased, including on 67/2. Pelvic cul-de-sac fluid is similar. Again identified is fluid within a right inguinal hernia. Fat containing tiny left inguinal hernia. No free intraperitoneal air. Again identified is extensive subcutaneous emphysema about the abdominopelvic and lower thoracic walls. Similar in distribution, slightly decreased. Musculoskeletal: Left hip osteoarthritis. IMPRESSION: 1. Status post cystoprostatectomy with ileal conduit creation. Similar appearance of ureteric stents in place, without urinary tract calculi or hydronephrosis. 2. Similar to slight increase in small volume abdominopelvic fluid. Minimal decrease in extensive subcutaneous emphysema. 3. Right-sided fluid containing and left-sided fat containing inguinal hernias, similar. 4. Coronary artery atherosclerosis. Aortic Atherosclerosis (ICD10-I70.0). Electronically Signed   By: Abigail Miyamoto M.D.   On: 11/15/2020 17:09   Medications: .  sodium bicarbonate (isotonic) infusion in sterile water 100 mL/hr at 11/16/20 1158   . apixaban  5 mg Oral BID  . atorvastatin  10 mg Oral QHS  . diltiazem  240 mg Oral Daily  . docusate sodium  100 mg Oral Daily  . insulin aspart  0-5 Units Subcutaneous QHS  . insulin aspart  0-9 Units Subcutaneous TID WC  . metoprolol tartrate  25 mg Oral BID    Assessment 76M AKI, hyperkalemia, NAG metabolic Acidosis in  setting of cystoprostatectomy 10/27/20, progressive anorexia/hypovolemia, ongoing use of ACE inhibitor.  Most likely this is a combination of hypovolemia and ongoing use of enalapril.  Imaging is without obstruction with intact ureteral stents but urology consulted to make sure that no other concerns are identified.  1. AKI, baseline creatinine 1.0; likely hypovolemia+ACEi; nonoliguric and slightly improved after volume expansion overnight 2. 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 Recent hematuria into ileal conduit 4. A. fib on apixaban 5. Hyperkalemia, moderate, no EKG changes, status post medical management but persistant 6. NAG metabolic acidosis likely related to #1 and #2 7. Mild hyponatremia 8. History of systolic heart failure and CAD  Plan 1. Cont isotonic sodium bicarbonate today 2. Do not resume ACE inhibitor 3. No current indication for dialysis but will follow closely as they may arise in the next 24-48h 4. Repeated lokelma dose this AM 5. Await urology input 6. Avoid hypotonic fluids, trend sodium 7. Daily weights, Daily Renal Panel, Strict I/Os, Avoid nephrotoxins (NSAIDs, judicious IV Contrast)    Jannifer Hick MD 11/16/2020, 12:23 PM  [facility] Pager: [phone]

## 2020-11-16 NOTE — Progress Notes (Signed)
Initial Nutrition Assessment  DOCUMENTATION CODES:  Obesity unspecified  INTERVENTION:  Recommend removing carb mod restriction from diet - spoke to MD.  Add Magic cup TID with meals, each supplement provides 290 kcal and 9 grams of protein.  NUTRITION DIAGNOSIS:  Inadequate oral intake related to poor appetite as evidenced by per patient/family report.  GOAL:  Patient will meet greater than or equal to 90% of their needs  MONITOR:  PO intake,Supplement acceptance,Labs,Weight trends,I & O's  REASON FOR ASSESSMENT:  Malnutrition Screening Tool    ASSESSMENT:  70 yo male with a PMH of bladder tumor s/p recent cystoprostatectomy on 4/6 with placement of ileal conduit and lateral stents, PAF, CAD, and HTN who presents with AKI and hyperkalemia.  Spoke with family and pt. Pt reports having little to no appetite for the past 3 weeks since his surgery on 4/6. Today, he feels like he has an appetite for the first time since that admission. He reports eating most of his breakfast and sipping on liquids today. Pt interested in having a chef salad as a late lunch.  He reports wt loss of about 5-10 lbs. Per Epic, no weight change is noted. On exam, pt has no depletions in fat or muscle stores.  Recommend adding Magic Cup TID to supplement PO intake. Also recommend removing carb mod restriction given A1c is normal.  Medications: colace, SSI, bedtime novolog, mealtime novolog, oxycodone Labs: reviewed; Na 128, K 5.7, CBG 72-114 HbA1c: 5.3% (10/2020)  NUTRITION - FOCUSED PHYSICAL EXAM: Flowsheet Row Most Recent Value  Orbital Region Mild depletion  Upper Arm Region No depletion  Thoracic and Lumbar Region No depletion  Buccal Region No depletion  Temple Region No depletion  Clavicle Bone Region No depletion  Clavicle and Acromion Bone Region No depletion  Scapular Bone Region No depletion  Dorsal Hand No depletion  Patellar Region No depletion  Anterior Thigh Region No depletion   Posterior Calf Region No depletion  Edema (RD Assessment) None  Hair Reviewed  Eyes Reviewed  Mouth Reviewed  Skin Reviewed  Nails Reviewed     Diet Order:   Diet Order            Diet renal with fluid restriction Fluid restriction: 1200 mL Fluid; Room service appropriate? Yes; Fluid consistency: Thin  Diet effective now                EDUCATION NEEDS:  Education needs have been addressed  Skin:  Skin Assessment: Skin Integrity Issues: Skin Integrity Issues:: Incisions Incisions: Abdomen, closed and penis, closed  Last BM:  11/14/20  Height:  Ht Readings from Last 1 Encounters:  11/15/20 6' (1.829 m)   Weight:  Wt Readings from Last 1 Encounters:  11/15/20 119.8 kg   Ideal Body Weight:  81 kg  BMI:  Body mass index is 35.82 kg/m.  Estimated Nutritional Needs: using IBW Kcal:  2000-2200 Protein:  95-110 grams Fluid:  1200 ml fluid restriction  Derrel Nip, RD, LDN Registered Dietitian After Hours/Weekend Pager # in [place]

## 2020-11-16 NOTE — Progress Notes (Signed)
PROGRESS NOTE    Zachary Shannon.  XKG:[id number] DOB: [birth year] DOA: 11/15/2020 PCP: Minna Antis, DO   Brief Narrative: 70 year old with past medical history significant of bladder tumor status post recent cystoprostatectomy on April 6 with placement of ileal conduit and lateral stents.  Patient presented to the ER complaining of flank and suprapubic pain and confusion.  Patient was found to have a creatinine more than 6.  His creatinine was 1.1 when he left the hospital post surgery.  He has a history of paroxysmal A. fib, chronic systolic heart failure, CAD and hyperlipidemia he is currently taking Eliquis.  Family noticed progressive weakness.  Denies taking NSAID but has been taking 80s.  Patient admitted with acute renal failure, CT renal stone showed no acute findings.     Assessment & Plan:   Principal Problem:   AKI (acute kidney injury) ([place]) Active Problems:   Bladder cancer ([place])   PAF (paroxysmal atrial fibrillation) ([facility])   Hyperkalemia   Hyperlipidemia   1-AKI; creatinine baseline 1.0 Could be related to hypovolemia and ACE use. Some improvement of renal function after IV fluids. IV fluids changed to is a tonic bicarb drip. Nephrology and urology has been consulted. Check CK level.   2-Back pain: He complains of back pain shoulder blade area mid back initially started on the left and now is on the right. -Plan to Proceed with thoracic MRI to further evaluate -Continue with oxycodone as needed  3-A fib; continue with Eliquis 4-Hyperkalemia;  -Lokelma and Bicarb Gtt.  5-Mild hyponatremia: On isotonic fluids 6-Bladder cancer status post recent cystoprostatectomy and ileal conduit and lateral stent placement October 27, 2020. -I have consulted Dr. Bess Harvest he will see patient today  7-Leukocytosis; reactive , improved.    Estimated body mass index is 35.82 kg/m as calculated from the following:   Height as of this encounter: 6' (1.829 m).   Weight as  of this encounter: 119.8 kg.   DVT prophylaxis: Eliquis Code Status: Full code Family Communication: Daughter at bedside Disposition Plan:  Status is: Inpatient  Remains inpatient appropriate because:IV treatments appropriate due to intensity of illness or inability to take PO   Dispo: The patient is from: Home              Anticipated d/c is to: Home              Patient currently is not medically stable to d/c.   Difficult to place patient No        Consultants:   Urology  Nephrology   Procedures:     Antimicrobials:    Subjective: She was having back pain, shoulder first on the right side then on left. Pain medication he got this morning help with pain. He denies arm weakness or numbness or tingling.    Objective: Vitals:   11/15/20 2151 11/15/20 2155 11/16/20 0445 11/16/20 1028  BP: (!) 156/82  94/66 136/81  Pulse: 91  68 83  Resp: (!) 22  18   Temp: 98.7 F (37.1 C)  99.3 F (37.4 C)   TempSrc: Oral  Oral   SpO2: 98%  96%   Weight:  119.8 kg    Height:  6' (1.829 m)      Intake/Output Summary (Last 24 hours) at 11/16/2020 1152 Last data filed at 11/16/2020 1006 Gross per 24 hour  Intake 682.61 ml  Output 1150 ml  Net -467.39 ml   Filed Weights   11/15/20 2155  Weight: 119.8  kg    Examination:  General exam: Appears calm and comfortable  Respiratory system: Clear to auscultation. Respiratory effort normal. Cardiovascular system: S1 & S2 heard, RRR. No JVD, murmurs, rubs, gallops or clicks. No pedal edema. Gastrointestinal system: Abdomen is nondistended, soft and nontender. No organomegaly or masses felt. Normal bowel sounds heard. Central nervous system: Alert and oriented. No focal neurological deficits. Extremities: Symmetric 5 x 5 power. Skin: No rashes, lesions or ulcers   Data Reviewed: I have personally reviewed following labs and imaging studies  CBC: Recent Labs  Lab 11/09/20 2251 11/15/20 1427 11/16/20 0544  WBC 16.2*  16.0* 11.8*  NEUTROABS 14.2*  --   --   HGB 13.8 12.4* 11.0*  HCT 43.3 37.7* 33.5*  MCV 100.9* 100.3* 99.1  PLT 441* 402* 527   Basic Metabolic Panel: Recent Labs  Lab 11/09/20 2251 11/15/20 1427 11/16/20 0544  NA 137 131* 128*  K 5.4* 6.9* 5.7*  CL 108 104 102  CO2 21* 15* 19*  GLUCOSE 115* 109* 122*  BUN 28* 91* 94*  CREATININE 2.26* 6.49* 5.88*  5.82*  CALCIUM 8.7* 8.5* 7.9*   GFR: Estimated Creatinine Clearance: 15.6 mL/min (A) (by C-G formula based on SCr of 5.88 mg/dL (H)). Liver Function Tests: Recent Labs  Lab 11/09/20 2251  AST 23  ALT 20  ALKPHOS 107  BILITOT 1.1  PROT 7.2  ALBUMIN 3.4*   No results for input(s): LIPASE, AMYLASE in the last 168 hours. No results for input(s): AMMONIA in the last 168 hours. Coagulation Profile: No results for input(s): INR, PROTIME in the last 168 hours. Cardiac Enzymes: No results for input(s): CKTOTAL, CKMB, CKMBINDEX, TROPONINI in the last 168 hours. BNP (last 3 results) No results for input(s): PROBNP in the last 8760 hours. HbA1C: Recent Labs    11/16/20 0544  HGBA1C 5.3   CBG: Recent Labs  Lab 11/15/20 1702 11/15/20 1805 11/15/20 2249 11/16/20 0718 11/16/20 1116  GLUCAP 72 114* 92 110* 100*   Lipid Profile: No results for input(s): CHOL, HDL, LDLCALC, TRIG, CHOLHDL, LDLDIRECT in the last 72 hours. Thyroid Function Tests: No results for input(s): TSH, T4TOTAL, FREET4, T3FREE, THYROIDAB in the last 72 hours. Anemia Panel: No results for input(s): VITAMINB12, FOLATE, FERRITIN, TIBC, IRON, RETICCTPCT in the last 72 hours. Sepsis Labs: No results for input(s): PROCALCITON, LATICACIDVEN in the last 168 hours.  Recent Results (from the past 240 hour(s))  Resp Panel by RT-PCR (Flu A&B, Covid) Nasopharyngeal Swab     Status: None   Collection Time: 11/15/20  6:33 PM   Specimen: Nasopharyngeal Swab; Nasopharyngeal(NP) swabs in vial transport medium  Result Value Ref Range Status   SARS Coronavirus 2 by RT  PCR NEGATIVE NEGATIVE Final    Comment: (NOTE) SARS-CoV-2 target nucleic acids are NOT DETECTED.  The SARS-CoV-2 RNA is generally detectable in upper respiratory specimens during the acute phase of infection. The lowest concentration of SARS-CoV-2 viral copies this assay can detect is 138 copies/mL. A negative result does not preclude SARS-Cov-2 infection and should not be used as the sole basis for treatment or other patient management decisions. A negative result may occur with  improper specimen collection/handling, submission of specimen other than nasopharyngeal swab, presence of viral mutation(s) within the areas targeted by this assay, and inadequate number of viral copies(<138 copies/mL). A negative result must be combined with clinical observations, patient history, and epidemiological information. The expected result is Negative.  Fact Sheet for Patients:  EntrepreneurPulse.com.au  Fact Sheet for Healthcare  Providers:  IncredibleEmployment.be  This test is no t yet approved or cleared by the [place] and  has been authorized for detection and/or diagnosis of SARS-CoV-2 by FDA under an Emergency Use Authorization (EUA). This EUA will remain  in effect (meaning this test can be used) for the duration of the COVID-19 declaration under Section 564(b)(1) of the Act, 21 U.S.C.section 360bbb-3(b)(1), unless the authorization is terminated  or revoked sooner.       Influenza A by PCR NEGATIVE NEGATIVE Final   Influenza B by PCR NEGATIVE NEGATIVE Final    Comment: (NOTE) The Xpert Xpress SARS-CoV-2/FLU/RSV plus assay is intended as an aid in the diagnosis of influenza from Nasopharyngeal swab specimens and should not be used as a sole basis for treatment. Nasal washings and aspirates are unacceptable for Xpert Xpress SARS-CoV-2/FLU/RSV testing.  Fact Sheet for Patients: EntrepreneurPulse.com.au  Fact Sheet for  Healthcare Providers: IncredibleEmployment.be  This test is not yet approved or cleared by the [place] FDA and has been authorized for detection and/or diagnosis of SARS-CoV-2 by FDA under an Emergency Use Authorization (EUA). This EUA will remain in effect (meaning this test can be used) for the duration of the COVID-19 declaration under Section 564(b)(1) of the Act, 21 U.S.C. section 360bbb-3(b)(1), unless the authorization is terminated or revoked.  Performed at [facility], [place] [address], [place],  [address]          Radiology Studies: DG Chest 2 View  Result Date: 11/15/2020 CLINICAL DATA:  Chest pain EXAM: CHEST - 2 VIEW COMPARISON:  None. FINDINGS: Cardiomegaly. Pulmonary vascular congestion. No pleural effusion or pneumothorax. No acute osseous abnormality. IMPRESSION: Pulmonary vascular congestion and cardiomegaly. Electronically Signed   By: Macy Mis M.D.   On: 11/15/2020 15:09   CT Head Wo Contrast  Result Date: 11/15/2020 CLINICAL DATA:  70 year old male with concern for intracranial hemorrhage. EXAM: CT HEAD WITHOUT CONTRAST TECHNIQUE: Contiguous axial images were obtained from the base of the skull through the vertex without intravenous contrast. COMPARISON:  None. FINDINGS: Brain: Mild age-related atrophy and chronic microvascular ischemic changes. There is no acute intracranial hemorrhage. No mass effect or midline shift. No extra-axial fluid collection. Vascular: No hyperdense vessel or unexpected calcification. Skull: Normal. Negative for fracture or focal lesion. Sinuses/Orbits: No acute finding. Other: None IMPRESSION: 1. No acute intracranial pathology. 2. Mild age-related atrophy and chronic microvascular ischemic changes. Electronically Signed   By: Anner Crete M.D.   On: 11/15/2020 16:57   CT Renal Stone Study  Result Date: 11/15/2020 CLINICAL DATA:  Flank pain. Urostomy 3 weeks ago. Bladder cancer.  Hematuria. EXAM: CT ABDOMEN AND PELVIS WITHOUT CONTRAST TECHNIQUE: Multidetector CT imaging of the abdomen and pelvis was performed following the standard protocol without IV contrast. COMPARISON:  11/09/2020 FINDINGS: Lower chest: Bibasilar scarring. Mild cardiomegaly, without pericardial or pleural effusion. Right coronary artery calcification. Hepatobiliary: Normal noncontrast appearance of the liver. Stones in the gallbladder without acute cholecystitis or biliary duct dilatation. Pancreas: Normal, without mass or ductal dilatation. Spleen: Normal in size, without focal abnormality. Adrenals/Urinary Tract: Normal adrenal glands. Bilateral ureteric stents again identified. The left-sided stent originates in an extrarenal pelvis. No renal calculi or hydronephrosis. Cystectomy and ileal conduit creation. 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 Stomach/Bowel: Normal stomach, without wall thickening. Descending duodenal diverticulum. Otherwise normal small bowel. Extensive colonic diverticulosis. Normal terminal ileum and appendix. Otherwise normal small bowel. Vascular/Lymphatic: Aortic atherosclerosis. No abdominopelvic adenopathy. Reproductive: Prostatectomy. Other: Small volume perisplenic fluid  is slightly increased. Pelvic interloop mesenteric fluid is minimally increased, including on 67/2. Pelvic cul-de-sac fluid is similar. Again identified is fluid within a right inguinal hernia. Fat containing tiny left inguinal hernia. No free intraperitoneal air. Again identified is extensive subcutaneous emphysema about the abdominopelvic and lower thoracic walls. Similar in distribution, slightly decreased. Musculoskeletal: Left hip osteoarthritis. IMPRESSION: 1. Status post cystoprostatectomy with ileal conduit creation. Similar appearance of ureteric stents in place, without urinary tract calculi or hydronephrosis. 2. Similar to slight increase in small volume abdominopelvic fluid. Minimal decrease in  extensive subcutaneous emphysema. 3. Right-sided fluid containing and left-sided fat containing inguinal hernias, similar. 4. Coronary artery atherosclerosis. Aortic Atherosclerosis (ICD10-I70.0). Electronically Signed   By: Abigail Miyamoto M.D.   On: 11/15/2020 17:09        Scheduled Meds: . apixaban  5 mg Oral BID  . atorvastatin  10 mg Oral QHS  . diltiazem  240 mg Oral Daily  . docusate sodium  100 mg Oral Daily  . insulin aspart  0-5 Units Subcutaneous QHS  . insulin aspart  0-9 Units Subcutaneous TID WC  . metoprolol tartrate  25 mg Oral BID   Continuous Infusions: .  sodium bicarbonate (isotonic) infusion in sterile water Stopped (11/16/20 1032)     LOS: 1 day    Time spent: 35 minutes    Linda Grimmer A Ozie Lupe, MD [facility]   If 7PM-7AM, please contact night-coverage [email or web address]  11/16/2020, 11:52 AM

## 2020-11-17 ENCOUNTER — Inpatient Hospital Stay (HOSPITAL_COMMUNITY): Payer: Medicare Other

## 2020-11-17 DIAGNOSIS — E782 Mixed hyperlipidemia: Secondary | ICD-10-CM

## 2020-11-17 DIAGNOSIS — N179 Acute kidney failure, unspecified: Secondary | ICD-10-CM | POA: Diagnosis not present

## 2020-11-17 DIAGNOSIS — R079 Chest pain, unspecified: Secondary | ICD-10-CM

## 2020-11-17 DIAGNOSIS — C679 Malignant neoplasm of bladder, unspecified: Secondary | ICD-10-CM

## 2020-11-17 DIAGNOSIS — I480 Paroxysmal atrial fibrillation: Secondary | ICD-10-CM | POA: Diagnosis not present

## 2020-11-17 DIAGNOSIS — I5022 Chronic systolic (congestive) heart failure: Secondary | ICD-10-CM

## 2020-11-17 LAB — RENAL FUNCTION PANEL
Albumin: 2.5 g/dL — ABNORMAL LOW (ref 3.5–5.0)
Anion gap: 11 (ref 5–15)
BUN: 92 mg/dL — ABNORMAL HIGH (ref 8–23)
CO2: 23 mmol/L (ref 22–32)
Calcium: 8.1 mg/dL — ABNORMAL LOW (ref 8.9–10.3)
Chloride: 100 mmol/L (ref 98–111)
Creatinine, Ser: 4.34 mg/dL — ABNORMAL HIGH (ref 0.61–1.24)
GFR, Estimated: 14 mL/min — ABNORMAL LOW (ref >60)
Glucose, Bld: 110 mg/dL — ABNORMAL HIGH (ref 70–99)
Phosphorus: 4.5 mg/dL (ref 2.5–4.6)
Potassium: 5.3 mmol/L — ABNORMAL HIGH (ref 3.5–5.1)
Sodium: 134 mmol/L — ABNORMAL LOW (ref 135–145)

## 2020-11-17 LAB — CBC
HCT: 33.1 % — ABNORMAL LOW (ref 39.0–52.0)
Hemoglobin: 10.7 g/dL — ABNORMAL LOW (ref 13.0–17.0)
MCH: 31.9 pg (ref 26.0–34.0)
MCHC: 32.3 g/dL (ref 30.0–36.0)
MCV: 98.8 fL (ref 80.0–100.0)
Platelets: 341 10*3/uL (ref 150–400)
RBC: 3.35 MIL/uL — ABNORMAL LOW (ref 4.22–5.81)
RDW: 15.1 % (ref 11.5–15.5)
WBC: 12.1 10*3/uL — ABNORMAL HIGH (ref 4.0–10.5)
nRBC: 0 % (ref 0.0–0.2)

## 2020-11-17 LAB — TROPONIN I (HIGH SENSITIVITY)
Troponin I (High Sensitivity): 26 ng/L — ABNORMAL HIGH (ref <18)
Troponin I (High Sensitivity): 30 ng/L — ABNORMAL HIGH (ref <18)

## 2020-11-17 LAB — GLUCOSE, CAPILLARY
Glucose-Capillary: 104 mg/dL — ABNORMAL HIGH (ref 70–99)
Glucose-Capillary: 155 mg/dL — ABNORMAL HIGH (ref 70–99)
Glucose-Capillary: 101 mg/dL — ABNORMAL HIGH (ref 70–99)
Glucose-Capillary: 116 mg/dL — ABNORMAL HIGH (ref 70–99)

## 2020-11-17 LAB — MRSA PCR SCREENING: MRSA by PCR: NEGATIVE

## 2020-11-17 MED ORDER — ASPIRIN EC 81 MG PO TBEC
81.0000 mg | DELAYED_RELEASE_TABLET | Freq: Every day | ORAL | Status: DC
  Administered 2020-11-17: 81 mg via ORAL
  Filled 2020-11-17: qty 1

## 2020-11-17 MED ORDER — TECHNETIUM TO 99M ALBUMIN AGGREGATED
4.4000 | Freq: Once | INTRAVENOUS | Status: AC | PRN
  Administered 2020-11-17: 4.4 via INTRAVENOUS

## 2020-11-17 MED ORDER — OXYCODONE-ACETAMINOPHEN 5-325 MG PO TABS
0.5000 | ORAL_TABLET | ORAL | Status: DC | PRN
  Administered 2020-11-17 – 2020-11-22 (×16): 1 via ORAL
  Filled 2020-11-17 (×16): qty 1

## 2020-11-17 MED ORDER — SODIUM ZIRCONIUM CYCLOSILICATE 10 G PO PACK: 10.0000 g | PACK | Freq: Once | ORAL | Status: DC

## 2020-11-17 MED ORDER — HYDROMORPHONE HCL 2 MG/ML IJ SOLN
0.5000 mg | INTRAMUSCULAR | Status: DC | PRN
  Administered 2020-11-20 – 2020-11-22 (×2): 0.5 mg via INTRAVENOUS
  Filled 2020-11-17 (×2): qty 1

## 2020-11-17 MED ORDER — SODIUM CHLORIDE 0.9 % IV BOLUS
500.0000 mL | Freq: Once | INTRAVENOUS | Status: AC
  Administered 2020-11-17: 500 mL via INTRAVENOUS

## 2020-11-17 MED ORDER — SODIUM BICARBONATE 650 MG PO TABS
1300.0000 mg | ORAL_TABLET | Freq: Two times a day (BID) | ORAL | Status: DC
  Administered 2020-11-17 – 2020-11-25 (×17): 1300 mg via ORAL
  Filled 2020-11-17 (×18): qty 2

## 2020-11-17 MED ORDER — SODIUM CHLORIDE 0.9 % IV SOLN
2.0000 g | Freq: Every day | INTRAVENOUS | Status: DC
  Administered 2020-11-17 – 2020-11-21 (×5): 2 g via INTRAVENOUS
  Filled 2020-11-17 (×6): qty 20

## 2020-11-17 MED ORDER — NITROGLYCERIN 0.4 MG SL SUBL
0.4000 mg | SUBLINGUAL_TABLET | SUBLINGUAL | Status: DC | PRN
  Administered 2020-11-17 (×2): 0.4 mg via SUBLINGUAL
  Filled 2020-11-17: qty 1

## 2020-11-17 MED ORDER — SODIUM ZIRCONIUM CYCLOSILICATE 10 G PO PACK
10.0000 g | PACK | Freq: Once | ORAL | Status: AC
  Administered 2020-11-17: 10 g via ORAL
  Filled 2020-11-17: qty 1

## 2020-11-17 MED ORDER — ORAL CARE MOUTH RINSE
15.0000 mL | Freq: Two times a day (BID) | OROMUCOSAL | Status: DC
  Administered 2020-11-17 – 2020-11-24 (×12): 15 mL via OROMUCOSAL

## 2020-11-17 MED ORDER — CHLORHEXIDINE GLUCONATE CLOTH 2 % EX PADS
6.0000 | MEDICATED_PAD | Freq: Every day | CUTANEOUS | Status: DC
  Administered 2020-11-17 – 2020-11-25 (×8): 6 via TOPICAL

## 2020-11-17 MED ORDER — HYDROMORPHONE HCL 2 MG/ML IJ SOLN
0.5000 mg | INTRAMUSCULAR | Status: DC | PRN
  Administered 2020-11-17: 0.5 mg via INTRAVENOUS
  Filled 2020-11-17: qty 1

## 2020-11-17 MED FILL — Sodium Bicarbonate IV Soln 8.4%: INTRAVENOUS | Qty: 150 | Status: AC

## 2020-11-17 MED FILL — Water For IV Injection: INTRAVENOUS | Qty: 1000 | Status: AC

## 2020-11-17 NOTE — Progress Notes (Addendum)
PROGRESS NOTE    Zachary Shannon.  [id number] DOB: [birth year] DOA: 11/15/2020 PCP: Minna Antis, DO   Brief Narrative: 70 year old with past medical history significant of bladder tumor status post recent cystoprostatectomy on April 6 with placement of ileal conduit and lateral stents.  Patient presented to the ER complaining of flank and suprapubic pain and confusion.  Patient was found to have a creatinine more than 6.  His creatinine was 1.1 when he left the hospital post surgery.  He has a history of paroxysmal A. fib, chronic systolic heart failure, CAD and hyperlipidemia he is currently taking Eliquis.  Family noticed progressive weakness.  Denies taking NSAID but has been taking 80s.  Patient admitted with acute renal failure, CT renal stone showed no acute findings.     Assessment & Plan:   Principal Problem:   AKI (acute kidney injury) ([place]) Active Problems:   Bladder cancer ([place])   PAF (paroxysmal atrial fibrillation) (H[facility])   Hyperkalemia   Hyperlipidemia   1-AKI; creatinine baseline 1.0 Could be related to hypovolemia and ACE use, Pyelo.  Some improvement of renal function after IV fluids. Received bicarb drip. Nephrology and urology has been consulted. CK level normal.  Plan to treat for pyelo, due to back pain, leukocytosis.  Cr trending down, improve from yesterday. Fluids discontinue to avoid volume overload.   2-Chest Pressure;  He is complaining of chest pressure this am, report SOB, this is different as his back pain.  Fluids has been discontinue by nephrologist.  Plan: -Check EKG, HR 103, chronic A fib, no ST elevation.            -Will check troponin.            -provided sub-lingual nitroglycerin tomes 2 mild improvement.            -IV dilaudid PRN for pain.            -He will get his oral metoprolol.            -Cardiology consulted.  Patient is on Eliquis, less likely PE.  Chest x ray order to rule out Pulmonary edema.  Evaluated  patient again; he reporter improvement of chest pressure, down to 4/10. Awaiting chest x ray results. Cardiology evaluation.  Discussed with cardiology, plan to get V-Q scan to rule out PE>   3-Back pain: He complains of back pain shoulder blade area mid back initially started on the left and now is on the right. -MRI showed mild disc protrussion. Discussed with Dr Arneta Cliche who review MRI, he doesn't see a significant disc protrusion,  unlikely that will explain patient's pain.  -Continue with oxycodone as needed.  4-A fib; continue with Eliquis, metoprolol and Cardizem.   5-Hyperkalemia;  -Received Lokelma and Bicarb change to oral.   6-Mild hyponatremia: Monitor.   7-Bladder cancer status post recent cystoprostatectomy and ileal conduit and lateral stent placement October 27, 2020. -I have consulted Dr. Tresa Moore.    8-Leukocytosis; reactive , improved. Plan to treat for pyelo  Addendum; 2;30PM Hypotension;  BP low this afternoon.  Will need to be carefull with opioids, to avoid hypotension and lethargic.  Plan for IV bolus.  Transfer to step down unit.   Estimated body mass index is 35.91 kg/m as calculated from the following:   Height as of this encounter: 6' (1.829 m).   Weight as of this encounter: 120.1 kg.   DVT prophylaxis: Eliquis Code Status: Full code Family Communication: Daughter at bedside  Disposition Plan:  Status is: Inpatient  Remains inpatient appropriate because:IV treatments appropriate due to intensity of illness or inability to take PO   Dispo: The patient is from: Home              Anticipated d/c is to: Home              Patient currently is not medically stable to d/c.   Difficult to place patient No        Consultants:   Urology  Nephrology   Procedures:     Antimicrobials:    Subjective: Patient report chest pressure, SOB, started this am. Different than his back pain.  He had BM yesterday.    Objective: Vitals:   11/17/20  0526 11/17/20 0731 11/17/20 1114 11/17/20 1116  BP: 106/78 125/83 132/76 132/76  Pulse: 95 94 86 86  Resp: 18 (!) 22  (!) 24  Temp: 98.1 F (36.7 C) 98.3 F (36.8 C)  98.6 F (37 C)  TempSrc: Oral Oral    SpO2: 98% 95%  94%  Weight: 120.1 kg     Height:        Intake/Output Summary (Last 24 hours) at 11/17/2020 1140 Last data filed at 11/17/2020 0911 Gross per 24 hour  Intake 354 ml  Output 1450 ml  Net -1096 ml   Filed Weights   11/15/20 2155 11/17/20 0526  Weight: 119.8 kg 120.1 kg    Examination:  General exam: in Pain Respiratory system: BL air movement, no wheezing.  Cardiovascular system: S 1, S 2 IRR Gastrointestinal system: BS present, soft , nt Central nervous system: alert and oriented Extremities: no edema   Data Reviewed: I have personally reviewed following labs and imaging studies  CBC: Recent Labs  Lab 11/15/20 1427 11/16/20 0544 11/17/20 0449  WBC 16.0* 11.8* 12.1*  HGB 12.4* 11.0* 10.7*  HCT 37.7* 33.5* 33.1*  MCV 100.3* 99.1 98.8  PLT 402* 350 829   Basic Metabolic Panel: Recent Labs  Lab 11/15/20 1427 11/16/20 0544 11/16/20 1326 11/17/20 0449  NA 131* 128* 129* 134*  K 6.9* 5.7* 5.3* 5.3*  CL 104 102 100 100  CO2 15* 19* 19* 23  GLUCOSE 109* 122* 100* 110*  BUN 91* 94* 90* 92*  CREATININE 6.49* 5.88*  5.82* 5.12* 4.34*  CALCIUM 8.5* 7.9* 7.9* 8.1*  PHOS  --   --   --  4.5   GFR: Estimated Creatinine Clearance: 21.2 mL/min (A) (by C-G formula based on SCr of 4.34 mg/dL (H)). Liver Function Tests: Recent Labs  Lab 11/17/20 0449  ALBUMIN 2.5*   No results for input(s): LIPASE, AMYLASE in the last 168 hours. No results for input(s): AMMONIA in the last 168 hours. Coagulation Profile: No results for input(s): INR, PROTIME in the last 168 hours. Cardiac Enzymes: Recent Labs  Lab 11/16/20 1326  CKTOTAL 82   BNP (last 3 results) No results for input(s): PROBNP in the last 8760 hours. HbA1C: Recent Labs     11/16/20 0544  HGBA1C 5.3   CBG: Recent Labs  Lab 11/16/20 1116 11/16/20 1608 11/16/20 2140 11/17/20 0757 11/17/20 1129  GLUCAP 100* 104* 105* 104* 155*   Lipid Profile: No results for input(s): CHOL, HDL, LDLCALC, TRIG, CHOLHDL, LDLDIRECT in the last 72 hours. Thyroid Function Tests: No results for input(s): TSH, T4TOTAL, FREET4, T3FREE, THYROIDAB in the last 72 hours. Anemia Panel: No results for input(s): VITAMINB12, FOLATE, FERRITIN, TIBC, IRON, RETICCTPCT in the last 72 hours. Sepsis Labs: No  results for input(s): PROCALCITON, LATICACIDVEN in the last 168 hours.  Recent Results (from the past 240 hour(s))  Resp Panel by RT-PCR (Flu A&B, Covid) Nasopharyngeal Swab     Status: None   Collection Time: 11/15/20  6:33 PM   Specimen: Nasopharyngeal Swab; Nasopharyngeal(NP) swabs in vial transport medium  Result Value Ref Range Status   SARS Coronavirus 2 by RT PCR NEGATIVE NEGATIVE Final    Comment: (NOTE) SARS-CoV-2 target nucleic acids are NOT DETECTED.  The SARS-CoV-2 RNA is generally detectable in upper respiratory specimens during the acute phase of infection. The lowest concentration of SARS-CoV-2 viral copies this assay can detect is 138 copies/mL. A negative result does not preclude SARS-Cov-2 infection and should not be used as the sole basis for treatment or other patient management decisions. A negative result may occur with  improper specimen collection/handling, submission of specimen other than nasopharyngeal swab, presence of viral mutation(s) within the areas targeted by this assay, and inadequate number of viral copies(<138 copies/mL). A negative result must be combined with clinical observations, patient history, and epidemiological information. The expected result is Negative.  Fact Sheet for Patients:  EntrepreneurPulse.com.au  Fact Sheet for Healthcare Providers:  IncredibleEmployment.be  This test is no t yet  approved or cleared by the [place] FDA and  has been authorized for detection and/or diagnosis of SARS-CoV-2 by FDA under an Emergency Use Authorization (EUA). This EUA will remain  in effect (meaning this test can be used) for the duration of the COVID-19 declaration under Section 564(b)(1) of the Act, 21 U.S.C.section 360bbb-3(b)(1), unless the authorization is terminated  or revoked sooner.       Influenza A by PCR NEGATIVE NEGATIVE Final   Influenza B by PCR NEGATIVE NEGATIVE Final    Comment: (NOTE) The Xpert Xpress SARS-CoV-2/FLU/RSV plus assay is intended as an aid in the diagnosis of influenza from Nasopharyngeal swab specimens and should not be used as a sole basis for treatment. Nasal washings and aspirates are unacceptable for Xpert Xpress SARS-CoV-2/FLU/RSV testing.  Fact Sheet for Patients: EntrepreneurPulse.com.au  Fact Sheet for Healthcare Providers: IncredibleEmployment.be  This test is not yet approved or cleared by the [place] FDA and has been authorized for detection and/or diagnosis of SARS-CoV-2 by FDA under an Emergency Use Authorization (EUA). This EUA will remain in effect (meaning this test can be used) for the duration of the COVID-19 declaration under Section 564(b)(1) of the Act, 21 U.S.C. section 360bbb-3(b)(1), unless the authorization is terminated or revoked.  Performed at [facility], [place] [address], [place], [place] [address]          Radiology Studies: DG Chest 2 View  Result Date: 11/15/2020 CLINICAL DATA:  Chest pain EXAM: CHEST - 2 VIEW COMPARISON:  None. FINDINGS: Cardiomegaly. Pulmonary vascular congestion. No pleural effusion or pneumothorax. No acute osseous abnormality. IMPRESSION: Pulmonary vascular congestion and cardiomegaly. Electronically Signed   By: Macy Mis M.D.   On: 11/15/2020 15:09   CT Head Wo Contrast  Result Date: 11/15/2020 CLINICAL DATA:   70 year old male with concern for intracranial hemorrhage. EXAM: CT HEAD WITHOUT CONTRAST TECHNIQUE: Contiguous axial images were obtained from the base of the skull through the vertex without intravenous contrast. COMPARISON:  None. FINDINGS: Brain: Mild age-related atrophy and chronic microvascular ischemic changes. There is no acute intracranial hemorrhage. No mass effect or midline shift. No extra-axial fluid collection. Vascular: No hyperdense vessel or unexpected calcification. Skull: Normal. Negative for fracture or focal lesion. Sinuses/Orbits: No acute  finding. Other: None IMPRESSION: 1. No acute intracranial pathology. 2. Mild age-related atrophy and chronic microvascular ischemic changes. Electronically Signed   By: Anner Crete M.D.   On: 11/15/2020 16:57   MR THORACIC SPINE WO CONTRAST  Result Date: 11/16/2020 CLINICAL DATA:  Initial evaluation for mid scapular back pain, right greater than left, for 1 week. EXAM: MRI THORACIC SPINE WITHOUT CONTRAST TECHNIQUE: Multiplanar, multisequence MR imaging of the thoracic spine was performed. No intravenous contrast was administered. COMPARISON:  None. FINDINGS: Alignment: Mild exaggeration of the normal thoracic kyphosis. No listhesis. Vertebrae: Vertebral body height maintained without acute or chronic fracture. Bone marrow signal intensity within normal limits. Subcentimeter benign hemangioma noted within the T9 vertebral body. No other discrete or worrisome osseous lesions. No abnormal marrow edema. Cord:  Normal signal and morphology. Paraspinal and other soft tissues: Paraspinous soft tissues within normal limits. Trace layering bilateral pleural effusions noted. Disc levels: T6-7: Small right paracentral disc protrusion indents and partially flattens the right ventral thecal sac (series 21, image 17). Mild flattening of the right ventral cord without cord signal changes or significant spinal stenosis. Foramina remain patent. Otherwise, no other  significant disc pathology seen for patient age. No other significant disc bulge or focal disc herniation. No canal or neural foraminal stenosis or evidence for neural impingement. IMPRESSION: 1. Small right paracentral disc protrusion at T6-7 with resultant mild flattening of the right ventral cord, but no significant stenosis. Finding could contribute to patient's symptoms. 2. Otherwise unremarkable MRI of the thoracic spine for age. 3. Trace layering bilateral pleural effusions. Electronically Signed   By: Jeannine Boga M.D.   On: 11/16/2020 23:01   CT Renal Stone Study  Result Date: 11/15/2020 CLINICAL DATA:  Flank pain. Urostomy 3 weeks ago. Bladder cancer. Hematuria. EXAM: CT ABDOMEN AND PELVIS WITHOUT CONTRAST TECHNIQUE: Multidetector CT imaging of the abdomen and pelvis was performed following the standard protocol without IV contrast. COMPARISON:  11/09/2020 FINDINGS: Lower chest: Bibasilar scarring. Mild cardiomegaly, without pericardial or pleural effusion. Right coronary artery calcification. Hepatobiliary: Normal noncontrast appearance of the liver. Stones in the gallbladder without acute cholecystitis or biliary duct dilatation. Pancreas: Normal, without mass or ductal dilatation. Spleen: Normal in size, without focal abnormality. Adrenals/Urinary Tract: Normal adrenal glands. Bilateral ureteric stents again identified. The left-sided stent originates in an extrarenal pelvis. No renal calculi or hydronephrosis. Cystectomy and ileal conduit creation. 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 Stomach/Bowel: Normal stomach, without wall thickening. Descending duodenal diverticulum. Otherwise normal small bowel. Extensive colonic diverticulosis. Normal terminal ileum and appendix. Otherwise normal small bowel. Vascular/Lymphatic: Aortic atherosclerosis. No abdominopelvic adenopathy. Reproductive: Prostatectomy. Other: Small volume perisplenic fluid is slightly increased. Pelvic interloop  mesenteric fluid is minimally increased, including on 67/2. Pelvic cul-de-sac fluid is similar. Again identified is fluid within a right inguinal hernia. Fat containing tiny left inguinal hernia. No free intraperitoneal air. Again identified is extensive subcutaneous emphysema about the abdominopelvic and lower thoracic walls. Similar in distribution, slightly decreased. Musculoskeletal: Left hip osteoarthritis. IMPRESSION: 1. Status post cystoprostatectomy with ileal conduit creation. Similar appearance of ureteric stents in place, without urinary tract calculi or hydronephrosis. 2. Similar to slight increase in small volume abdominopelvic fluid. Minimal decrease in extensive subcutaneous emphysema. 3. Right-sided fluid containing and left-sided fat containing inguinal hernias, similar. 4. Coronary artery atherosclerosis. Aortic Atherosclerosis (ICD10-I70.0). Electronically Signed   By: Abigail Miyamoto M.D.   On: 11/15/2020 17:09        Scheduled Meds: . apixaban  5 mg Oral  BID  . atorvastatin  10 mg Oral QHS  . diltiazem  240 mg Oral Daily  . docusate sodium  100 mg Oral Daily  . insulin aspart  0-5 Units Subcutaneous QHS  . insulin aspart  0-9 Units Subcutaneous TID WC  . metoprolol tartrate  25 mg Oral BID  . sodium bicarbonate  1,300 mg Oral BID   Continuous Infusions:    LOS: 2 days    Time spent: 35 minutes    Yerick Eggebrecht A Ericha Whittingham, MD [facility]   If 7PM-7AM, please contact night-coverage [email or web address]  11/17/2020, 11:40 AM

## 2020-11-17 NOTE — Consult Note (Addendum)
[place] Nurse ostomy consult note Pt is familiar to [place] team from recent Urostomy surgery, which was performed on 4/6.  He states home health is assisting with pouch changes and he is able to open and close to empty and attach and remove bedside drainage bag prior to admission.  He has been using a 2 piece convex pouching system.  Informed him we only carry one piece convex pouches and he verbalized understanding. Current pouch is leaking behind the barrier.  Stoma type/location: Stoma is red and viable, flush with skin level, 2 stints in place, 1 inch and slightly oval.   Peristomal assessment: intact skin surrounding Output: mod amt blood-tigned urine  Ostomy pouching: Use Supplies: barrier ring, [place] # [id number], and convex urostomy pouch Lawson # [phone] Applied barrier ring and one piece convex pouch and attached to bedside drainage bag. He currently feels very ill and unable to assist with pouch application process. 2 sets of extra supplies left at the bedside for staff nurse use. Enrolled patient in [place] program: Yes, during previous admission. Please re-consult if further assistance is needed.  Thank-you,  Julien Girt MSN, [employer], [place], [place], [place]

## 2020-11-17 NOTE — Evaluation (Signed)
Occupational Therapy Evaluation Patient Details Name: Zachary Shannon. MRN: [id number] DOB: Dec 01, 1950 Today's Date: 11/17/2020    History of Present Illness 70 year old with PMH significant of bladder tumor status post recent cystoprostatectomy on April 6 with placement of ileal conduit and lateral stents.  Patient presented to the ER complaining of flank and suprapubic pain and confusion. Pt admitted for acute renal failure   Clinical Impression   Patient lives home alone in a single level house with threshold step to enter. Patient typically independent at baseline, has family support as needed. Currently evaluation limited due to chest pressure and patient HR increase to 115-125 A-fib per heart monitor. Patient min G for safety and needing increased time to sit upright to edge of bed. O2 sats remain stable both in semi-supine and edge of bed on room air. MD arrive during evaluation and ask to defer further mobility due to patient's chest pressure/tightness which patient reports onset was this morning. Will continue to follow acutely to further assess patient's mobility and independence with self care. Anticipate will not have further needs at discharge however will reassess once patient medically stable/better able to participate.    Follow Up Recommendations  Home health OT;Other (comment) (vs no follow up pending patient progress)    Equipment Recommendations  None recommended by OT       Precautions / Restrictions Precautions Precautions: Fall Precaution Comments: monitor HR/vitals Restrictions Weight Bearing Restrictions: No      Mobility Bed Mobility Overal bed mobility: Needs Assistance Bed Mobility: Supine to Sit     Supine to sit: Min guard;HOB elevated     General bed mobility comments: patient needing increased time and head of bed significantly elevated    Transfers                 General transfer comment: deferred    Balance Overall balance  assessment: Needs assistance Sitting-balance support: Feet supported Sitting balance-Leahy Scale: Good         Standing balance comment: deferred                           ADL either performed or assessed with clinical judgement   ADL Overall ADL's : Needs assistance/impaired Eating/Feeding: Independent;Bed level   Grooming: Set up;Sitting   Upper Body Bathing: Set up;Sitting   Lower Body Bathing: Moderate assistance;Sitting/lateral leans;Sit to/from stand   Upper Body Dressing : Set up;Sitting   Lower Body Dressing: Moderate assistance;Sitting/lateral leans;Sit to/from Health and safety inspector Details (indicate cue type and reason): did not assess, patient having chest pressure and MD arrive asking to defer further therapy. patient did state that he ambulated to bathroom eariler this morning Toileting- Clothing Manipulation and Hygiene: Maximal assistance;Bed level Toileting - Clothing Manipulation Details (indicate cue type and reason): due to cardiac symptoms       General ADL Comments: evaluation limited, upon sitting up at edge of bed patient HR between 115-125 A-fib per heart monitor. MD arrive and ask to defer further mobility at this time                  Pertinent Vitals/Pain Pain Assessment: Faces Faces Pain Scale: Hurts even more Pain Location: chest Pain Descriptors / Indicators: Pressure Pain Intervention(s): Limited activity within patient's tolerance     Hand Dominance Right   Extremity/Trunk Assessment Upper Extremity Assessment Upper Extremity Assessment: Overall WFL for tasks assessed   Lower Extremity Assessment Lower  Extremity Assessment: Defer to PT evaluation       Communication Communication Communication: No difficulties   Cognition Arousal/Alertness: Awake/alert Behavior During Therapy: WFL for tasks assessed/performed Overall Cognitive Status: Within Functional Limits for tasks assessed                                                 Home Living Family/patient expects to be discharged to:: Private residence Living Arrangements: Alone Available Help at Discharge: Family;Available 24 hours/day Type of Home: House Home Access: Other (comment) (threshold to enter)     Home Layout: One level     Bathroom Shower/Tub: Occupational psychologist: Standard     Home Equipment: Shower seat - built in          Prior Functioning/Environment Level of Independence: Independent        Comments: Pt reports independent with ADLs, community ambulation, completes yardwork, drives.        OT Problem List: Decreased activity tolerance;Obesity;Pain      OT Treatment/Interventions: Self-care/ADL training;Therapeutic activities;Patient/family education    OT Goals(Current goals can be found in the care plan section) Acute Rehab OT Goals Patient Stated Goal: feel better OT Goal Formulation: With patient Time For Goal Achievement: 12/01/20 Potential to Achieve Goals: Good  OT Frequency: Min 2X/week    AM-PAC OT "6 Clicks" Daily Activity     Outcome Measure Help from another person eating meals?: None Help from another person taking care of personal grooming?: A Little Help from another person toileting, which includes using toliet, bedpan, or urinal?: A Lot Help from another person bathing (including washing, rinsing, drying)?: A Lot Help from another person to put on and taking off regular upper body clothing?: A Little Help from another person to put on and taking off regular lower body clothing?: A Lot 6 Click Score: 16   End of Session Nurse Communication: Mobility status  Activity Tolerance: Treatment limited secondary to medical complications (Comment) (chest pressure) Patient left: in bed;with call bell/phone within reach;with family/visitor present  OT Visit Diagnosis: Other abnormalities of gait and mobility (R26.89);Pain Pain - part of body:  (chest, back)                 Time: 6945-0388 OT Time Calculation (min): 14 min Charges:  OT General Charges $OT Visit: 1 Visit OT Evaluation $OT Eval Low Complexity: [place] OT OT pager: [phone]  Rosemary Holms 11/17/2020, 2:41 PM

## 2020-11-17 NOTE — Progress Notes (Addendum)
Pt transferred from [place]. Skin assessed blister found on right, mid-back. 4cmx2cm. Serous fluid, pink surrounding tissue. Assessed, cleansed, foam dressing applied. Pt denies being aware of blister on his back and denies tenderness to the area.

## 2020-11-17 NOTE — Consult Note (Signed)
Cardiology Consultation:   Patient ID: Zachary Shannon. MRN: [id number]; DOB: [birth year]  Admit date: 11/15/2020 Date of Consult: 11/17/2020  PCP:  Minna Antis, [place]  Cardiologist:  [facility] - new to Dr. Debara Pickett  Patient Profile:   Zachary Shannon. is a 70 y.o. male with a hx of Afib on eliquis, ischemic heart disease, CAD, chronic systolic heart failure, MR, AR, HTN, HLD, bladder cancer, and former smoker who is being seen today for the evaluation of chest pain at the request of Dr. Tyrell Antonio.  History of Present Illness:   Zachary Shannon follows with cardiology at [facility]. He was seen 07/07/20 for preop clearance. He has a history of permanent Afib. He also developed a DVT following eliquis hold for 3 days for last urological procedure. He was able to complete 10.0 METS without angina and was cleared for surgery. He is  Maintained on hydralazine 25 mg TID, 20 mg enalapril, 10 mg lipitor, cardizem 240 mg daily, lopressor 25 mg BID. He was also cleared to hold eliquis for 48 hrs. 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 He underwent robotic cystoprostatectomy with node dissection and conduit diversion 10/27/20 for recurrent high grade bladder cancer with negative margins.   He presented to the ER with hematuria on 11/09/20. Dr. Tresa Moore was consulted and recommended outpatient follow up. He presented back to the ER on 11/15/20 with ongoing hematuria, confusion and acute renal failure with sCr > 6. Imaging did not show obstruction, good placement of stents. Early pyelonephritis was suspected given bilateral flank pain and mild leukocytosis.  He reported chest discomfort today not relieved by nitro. Cardiology was consulted. Cardizem was held for soft BP. Nephrology following, felt his AKI secondary to hypotension and ACEI use. Since admission, renal function improving.   HS troponin 26 EKG with Afib  Hb 10.7 - down from  14.8 at 11/01/20 Hematuria  During my interview, he denies history of heart failure, heart disease, and prior MI. Daughter is at bedside and helps with history. Sounds like he had a heart cath many years ago (?10 years ago) that showed a blockage with collaterals, no intervention. No history of CABG. Unclear what prompted the heart cath. He reports shortness of breath since surgery. Daughter agrees he has had DOE with very minimal exertion. Unclear when his SOB started. Pt does report chest tightness this morning at approximately 0600-0630 after breakfast. CP was a pressure rated as a 7/10. CP without radiation but some shortness of breath. He reports nitro did not improve his CP. He states his chest pain has persisted, but now more mild.    Past Medical History:  Diagnosis Date  . Anticoagulant long-term use    eliquis--- managed by cardiology  . Bladder tumor   . Cancer ([place])    bladder  . Cardiomyopathy [facility])    dx 2009 per cardiac cath w/ ef 20-25%;   per cardiology note   . Coronary artery disease cardiologist--- dr Ronnell Freshwater  ([facility] in [place] )   per cardiologist note dated 09-16-2019 , received via fax,  pt had cardiac cath 2009 done at [facility] that showed CTO of LAD with excellent collateralization from LCx and RCA, no PCI performed, sig. cardiomyopathy ef 20-25%;  per note stated improved to 45-50% (no documentation available in note and no echo)  . Dysrhythmia    Afib  . Hematuria   . Hyperlipidemia   . Hypertension  followed by pcp  . Nocturia   . PAF (paroxysmal atrial fibrillation) [facility])    cardiologist--- dr Ronnell Freshwater--- dx 2009    Past Surgical History:  Procedure Laterality Date  . CARDIAC CATHETERIZATION  2009   @[facility]   per pt done due to dx AFib;  told no blockage's  . CATARACT EXTRACTION W/ INTRAOCULAR LENS  IMPLANT, BILATERAL  02/2019  . CYSTOSCOPY WITH INJECTION N/A 10/27/2020    Procedure: CYSTOSCOPY WITH INJECTION OF INDOCYANINE GREEN DYE;  Surgeon: Alexis Frock, MD;  Location: [facility];  Service: Urology;  Laterality: N/A;  . LYMPH NODE DISSECTION Bilateral 10/27/2020   Procedure: LYMPH NODE DISSECTION;  Surgeon: Alexis Frock, MD;  Location: [facility];  Service: Urology;  Laterality: Bilateral;  . ROBOT ASSISTED LAPAROSCOPIC COMPLETE CYSTECT ILEAL CONDUIT N/A 10/27/2020   Procedure: XI ROBOTIC ASSISTED LAPAROSCOPIC COMPLETE CYSTECT ILEAL CONDUIT, RADICAL PROSTATECTOMY AND BILATERAL INGUINAL HERNIA REPAIR;  Surgeon: Alexis Frock, MD;  Location: [facility];  Service: Urology;  Laterality: N/A;  6 HRS  . TRANSURETHRAL RESECTION OF BLADDER TUMOR N/A 10/29/2019   Procedure: TRANSURETHRAL RESECTION OF BLADDER TUMOR  (TURBT), CYSTOSCOPY/ INTRAVESICAL INSTILLATION OF GEMCITABINE;  Surgeon: Ceasar Mons, MD;  Location: [facility];  Service: Urology;  Laterality: N/A;  . TRANSURETHRAL RESECTION OF BLADDER TUMOR N/A 07/14/2020   Procedure: TRANSURETHRAL RESECTION OF BLADDER TUMOR (TURBT) WITH CYSTOSCOPY/ GEMCITABINE INSTILLATION;  Surgeon: Ceasar Mons, MD;  Location: [facility];  Service: Urology;  Laterality: N/A;     Home Medications:  Prior to Admission medications   Medication Sig Start Date End Date Taking? Authorizing Provider  apixaban (ELIQUIS) 5 MG TABS tablet Take 5 mg by mouth 2 (two) times daily.   Yes [provider]  atorvastatin (LIPITOR) 10 MG tablet Take 10 mg by mouth at bedtime.   Yes [provider]  diltiazem (DILACOR XR) 240 MG 24 hr capsule Take 240 mg by mouth daily.   Yes [provider]  enalapril (VASOTEC) 20 MG tablet Take 20 mg by mouth 2 (two) times daily.   Yes [provider]  hydrALAZINE (APRESOLINE) 25 MG tablet Take 25 mg by mouth 3 (three) times daily.   Yes [provider]  metoprolol tartrate (LOPRESSOR) 50 MG tablet Take 25 mg by mouth 2 (two) times daily.   Yes  [provider]  oxyCODONE-acetaminophen (PERCOCET) 5-325 MG tablet Take 1 tablet by mouth every 6 (six) hours as needed for moderate pain or severe pain. Post-operatively Patient taking differently: Take 0.5-1 tablets by mouth every 6 (six) hours as needed for moderate pain or severe pain. Post-operatively 11/02/20 11/02/21 Yes Alexis Frock, MD    Inpatient Medications: Scheduled Meds: . apixaban  5 mg Oral BID  . aspirin EC  81 mg Oral Daily  . atorvastatin  10 mg Oral QHS  . diltiazem  240 mg Oral Daily  . docusate sodium  100 mg Oral Daily  . insulin aspart  0-5 Units Subcutaneous QHS  . insulin aspart  0-9 Units Subcutaneous TID WC  . metoprolol tartrate  25 mg Oral BID  . sodium bicarbonate  1,300 mg Oral BID   Continuous Infusions: . cefTRIAXone (ROCEPHIN)  IV 2 g (11/17/20 1329)  . sodium chloride     PRN Meds: acetaminophen **OR** acetaminophen, calcium carbonate (dosed in mg elemental calcium), camphor-menthol **AND** hydrOXYzine, docusate sodium, hydrALAZINE, HYDROmorphone, labetalol, nitroGLYCERIN, ondansetron **OR** ondansetron (ZOFRAN) IV, oxyCODONE-acetaminophen, sorbitol, zolpidem  Allergies:   No Known Allergies  Social  History:   Social History   Socioeconomic History  . Marital status: Divorced    Spouse name: Not on file  . Number of children: Not on file  . Years of education: Not on file  . Highest education level: Not on file  Occupational History  . Not on file  Tobacco Use  . Smoking status: Former Smoker    Packs/day: 0.50    Years: 28.00    Pack years: 14.00    Types: Cigarettes    Quit date: 10/08/2008    Years since quitting: 12.1  . Smokeless tobacco: Former Systems developer    Quit date: 10/08/2008  Vaping Use  . Vaping Use: Never used  Substance and Sexual Activity  . Alcohol use: Never  . Drug use: Never  . Sexual activity: Not on file  Other Topics Concern  . Not on file  Social History Narrative  . Not on file   Social  Determinants of Health   Financial Resource Strain: Not on file  Food Insecurity: Not on file  Transportation Needs: Not on file  Physical Activity: Not on file  Stress: Not on file  Social Connections: Not on file  Intimate Partner Violence: Not on file    Family History:   No family history on file.   ROS:  Please see the history of present illness.   All other ROS reviewed and negative.     Physical Exam/Data:   Vitals:   11/17/20 1251 11/17/20 1351 11/17/20 1353 11/17/20 1400  BP: 113/80 (!) 83/65 (!) 81/64 (!) 80/61  Pulse: 99 71 68 68  Resp: (!) 24  (!) 22   Temp: 98.4 F (36.9 C)  98.3 F (36.8 C)   TempSrc:   Oral   SpO2: 98%  97%   Weight:      Height:        Intake/Output Summary (Last 24 hours) at 11/17/2020 1433 Last data filed at 11/17/2020 1419 Gross per 24 hour  Intake 590 ml  Output 1450 ml  Net -860 ml   Last 3 Weights 11/17/2020 11/15/2020 11/09/2020  Weight (lbs) 264 lb 12.4 oz 264 lb 1.6 oz 256 lb  Weight (kg) 120.1 kg 119.795 kg 116.121 kg     Body mass index is 35.91 kg/m.  General:  Elderly male in NAD HEENT: normal Neck: no JVD Cardiac:  Irregular rhythm, regular rate Lungs:  clear to auscultation bilaterally, no wheezing, rhonchi or rales  Abd: soft, nontender, no hepatomegaly  Ext: no edema Musculoskeletal:  No deformities, BUE and BLE strength normal and equal Skin: warm and dry  Neuro:  CNs 2-12 intact, no focal abnormalities noted Psych:  Normal affect   EKG:  The EKG was personally reviewed and demonstrates:  Atrial fibrillation with ventricular rate 103 Telemetry:  Telemetry was personally reviewed and demonstrates:  N/A  Relevant CV Studies:  Echo 11/04/19: Summary  1. Overall left ventricular ejectionfraction is estimated at 60 to 65%.  2. Normal global left ventricular systolic function.  3. Normal right ventricular size and systolic function.  4. Moderately dilated left atrium.  5. Moderately dilated right  atrium.  6. Mild mitral valve regurgitation.    Nuclear stress test 10/16/19: Mildly abnormal pharmacologic myocardial perfusion study.  1. There is a small moderate intensity fixed perfusion defect involving  the apex and apical anterior and anteroseptal segments consistent with  prior infarction in the left anterior descending artery territory with no  significant ischemia..  2. Transient ischemic dilatation (TID):  0.97  3. Normal left ventricular systolic function; LVEF : 60%, SV : 60 mL. No  regional wall motion abnormalities.  4. No significant EKG changes with stress. Atrial fibrillation at rest.  5. Low-intermediate risk study.   6. No prior studies to compare  7. No extracardiac uptake    Laboratory Data:  High Sensitivity Troponin:   Recent Labs  Lab 11/15/20 1427 11/15/20 1612 11/17/20 0449 11/17/20 1246  TROPONINIHS 21* 21* 26* 30*     Chemistry Recent Labs  Lab 11/16/20 0544 11/16/20 1326 11/17/20 0449  NA 128* 129* 134*  K 5.7* 5.3* 5.3*  CL 102 100 100  CO2 19* 19* 23  GLUCOSE 122* 100* 110*  BUN 94* 90* 92*  CREATININE 5.88*  5.82* 5.12* 4.34*  CALCIUM 7.9* 7.9* 8.1*  GFRNONAA 10*  10* 11* 14*  ANIONGAP 7 10 11     Recent Labs  Lab 11/17/20 0449  ALBUMIN 2.5*   Hematology Recent Labs  Lab 11/15/20 1427 11/16/20 0544 11/17/20 0449  WBC 16.0* 11.8* 12.1*  RBC 3.76* 3.38* 3.35*  HGB 12.4* 11.0* 10.7*  HCT 37.7* 33.5* 33.1*  MCV 100.3* 99.1 98.8  MCH 33.0 32.5 31.9  MCHC 32.9 32.8 32.3  RDW 15.2 15.3 15.1  PLT 402* 350 341   BNPNo results for input(s): BNP, PROBNP in the last 168 hours.  DDimer No results for input(s): DDIMER in the last 168 hours.   Radiology/Studies:  DG Chest 2 View  Result Date: 11/15/2020 CLINICAL DATA:  Chest pain EXAM: CHEST - 2 VIEW COMPARISON:  None. FINDINGS: Cardiomegaly. Pulmonary vascular congestion. No pleural effusion or pneumothorax. No acute osseous abnormality. IMPRESSION: Pulmonary  vascular congestion and cardiomegaly. Electronically Signed   By: Macy Mis M.D.   On: 11/15/2020 15:09   CT Head Wo Contrast  Result Date: 11/15/2020 CLINICAL DATA:  70 year old male with concern for intracranial hemorrhage. EXAM: CT HEAD WITHOUT CONTRAST TECHNIQUE: Contiguous axial images were obtained from the base of the skull through the vertex without intravenous contrast. COMPARISON:  None. FINDINGS: Brain: Mild age-related atrophy and chronic microvascular ischemic changes. There is no acute intracranial hemorrhage. No mass effect or midline shift. No extra-axial fluid collection. Vascular: No hyperdense vessel or unexpected calcification. Skull: Normal. Negative for fracture or focal lesion. Sinuses/Orbits: No acute finding. Other: None IMPRESSION: 1. No acute intracranial pathology. 2. Mild age-related atrophy and chronic microvascular ischemic changes. Electronically Signed   By: Anner Crete M.D.   On: 11/15/2020 16:57   MR THORACIC SPINE WO CONTRAST  Result Date: 11/16/2020 CLINICAL DATA:  Initial evaluation for mid scapular back pain, right greater than left, for 1 week. EXAM: MRI THORACIC SPINE WITHOUT CONTRAST TECHNIQUE: Multiplanar, multisequence MR imaging of the thoracic spine was performed. No intravenous contrast was administered. COMPARISON:  None. FINDINGS: Alignment: Mild exaggeration of the normal thoracic kyphosis. No listhesis. Vertebrae: Vertebral body height maintained without acute or chronic fracture. Bone marrow signal intensity within normal limits. Subcentimeter benign hemangioma noted within the T9 vertebral body. No other discrete or worrisome osseous lesions. No abnormal marrow edema. Cord:  Normal signal and morphology. Paraspinal and other soft tissues: Paraspinous soft tissues within normal limits. Trace layering bilateral pleural effusions noted. Disc levels: T6-7: Small right paracentral disc protrusion indents and partially flattens the right ventral thecal  sac (series 21, image 17). Mild flattening of the right ventral cord without cord signal changes or significant spinal stenosis. Foramina remain patent. Otherwise, no other significant disc pathology seen for patient age.  No other significant disc bulge or focal disc herniation. No canal or neural foraminal stenosis or evidence for neural impingement. IMPRESSION: 1. Small right paracentral disc protrusion at T6-7 with resultant mild flattening of the right ventral cord, but no significant stenosis. Finding could contribute to patient's symptoms. 2. Otherwise unremarkable MRI of the thoracic spine for age. 3. Trace layering bilateral pleural effusions. Electronically Signed   By: Jeannine Boga M.D.   On: 11/16/2020 23:01   DG CHEST PORT 1 VIEW  Result Date: 11/17/2020 CLINICAL DATA:  Chest pain EXAM: PORTABLE CHEST 1 VIEW COMPARISON:  November 15, 2020. FINDINGS: Lungs are clear. Heart is mildly enlarged with pulmonary vascularity within normal limits. No adenopathy. There is aortic atherosclerosis. No bone lesions. IMPRESSION: Mild cardiomegaly. No edema or airspace opacity. Aortic Atherosclerosis (ICD10-I70.0). Electronically Signed   By: Lowella Grip III M.D.   On: 11/17/2020 12:03   CT Renal Stone Study  Result Date: 11/15/2020 CLINICAL DATA:  Flank pain. Urostomy 3 weeks ago. Bladder cancer. Hematuria. EXAM: CT ABDOMEN AND PELVIS WITHOUT CONTRAST TECHNIQUE: Multidetector CT imaging of the abdomen and pelvis was performed following the standard protocol without IV contrast. COMPARISON:  11/09/2020 FINDINGS: Lower chest: Bibasilar scarring. Mild cardiomegaly, without pericardial or pleural effusion. Right coronary artery calcification. Hepatobiliary: Normal noncontrast appearance of the liver. Stones in the gallbladder without acute cholecystitis or biliary duct dilatation. Pancreas: Normal, without mass or ductal dilatation. Spleen: Normal in size, without focal abnormality. Adrenals/Urinary  Tract: Normal adrenal glands. Bilateral ureteric stents again identified. The left-sided stent originates in an extrarenal pelvis. No renal calculi or hydronephrosis. Cystectomy and ileal conduit creation. 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 Stomach/Bowel: Normal stomach, without wall thickening. Descending duodenal diverticulum. Otherwise normal small bowel. Extensive colonic diverticulosis. Normal terminal ileum and appendix. Otherwise normal small bowel. Vascular/Lymphatic: Aortic atherosclerosis. No abdominopelvic adenopathy. Reproductive: Prostatectomy. Other: Small volume perisplenic fluid is slightly increased. Pelvic interloop mesenteric fluid is minimally increased, including on 67/2. Pelvic cul-de-sac fluid is similar. Again identified is fluid within a right inguinal hernia. Fat containing tiny left inguinal hernia. No free intraperitoneal air. Again identified is extensive subcutaneous emphysema about the abdominopelvic and lower thoracic walls. Similar in distribution, slightly decreased. Musculoskeletal: Left hip osteoarthritis. IMPRESSION: 1. Status post cystoprostatectomy with ileal conduit creation. Similar appearance of ureteric stents in place, without urinary tract calculi or hydronephrosis. 2. Similar to slight increase in small volume abdominopelvic fluid. Minimal decrease in extensive subcutaneous emphysema. 3. Right-sided fluid containing and left-sided fat containing inguinal hernias, similar. 4. Coronary artery atherosclerosis. Aortic Atherosclerosis (ICD10-I70.0). Electronically Signed   By: Abigail Miyamoto M.D.   On: 11/15/2020 17:09     Assessment and Plan:   Chest pain - hs troponin 26 --> 30 - EKG does not appear ischemic - CP not relieved by nitro - low suspicion for an ACS process - he has known disease, although details are unclear - given his renal function, would continue to treat conservatively   Dyspnea on exertion - pt does not appear volume up - will  obtain echo - did hold eliquis prior to surgery, consider ruling out PE   Permanent Atrial fibrillation - cardizem on hold due to hypotension - continue metoprolol 25 mg BID - continue eliquis    Anemia Hematuria - Hb continues to decline - per urology   Hypotension - holding cardizem - continue lopressor as tolerated for Afib, CAD, and hx of ICM    Risk Assessment/Risk Scores:     HEAR  Score (for undifferentiated chest pain):  HEAR Score: 5  New York Heart Association (NYHA) Functional Class NYHA Class III  CHA2DS2-VASc Score = 4  This indicates a 4.8% annual risk of stroke. The patient's score is based upon: CHF History: Yes HTN History: Yes Diabetes History: No Stroke History: No Vascular Disease History: Yes Age Score: 1 Gender Score: 0       For questions or updates, please contact [place] Please consult [email or web address] for contact info under    Signed, Ledora Bottcher, [place]  11/17/2020 2:33 PM

## 2020-11-17 NOTE — Progress Notes (Signed)
[facility] Progress Note   Subjective:   UOP 1.9L yesterday and creatinine improved to 4.3 today.   Having some chest pressure with deep inspiration today.  On RA.  No BM since Monday.   Objective Vitals:   11/16/20 1028 11/16/20 1301 11/16/20 2138 11/17/20 0526  BP: 136/81 119/78 108/75 106/78  Pulse: 83 77 94 95  Resp:  20 20 18   Temp:  98.9 F (37.2 C) 98.4 F (36.9 C) 98.1 F (36.7 C)  TempSrc:  Axillary Oral Oral  SpO2:  97% 97% 98%  Weight:    120.1 kg  Height:       Physical Exam General: obese man lying at 44 comfortably ENT:  MM dry Heart: RRR, no rub Lungs: normal WOB, faint exp wheeze in upper airway noted Abdomen: soft, RLQ urostomy present, midline periumbilical incision c/d/i Extremities: no edema Neuro:  AOx3, conversant, no asterixis  Additional Objective Labs: Basic Metabolic Panel: Recent Labs  Lab 11/16/20 0544 11/16/20 1326 11/17/20 0449  NA 128* 129* 134*  K 5.7* 5.3* 5.3*  CL 102 100 100  CO2 19* 19* 23  GLUCOSE 122* 100* 110*  BUN 94* 90* 92*  CREATININE 5.88*  5.82* 5.12* 4.34*  CALCIUM 7.9* 7.9* 8.1*  PHOS  --   --  4.5   Liver Function Tests: Recent Labs  Lab 11/17/20 0449  ALBUMIN 2.5*   No results for input(s): LIPASE, AMYLASE in the last 168 hours. CBC: Recent Labs  Lab 11/15/20 1427 11/16/20 0544 11/17/20 0449  WBC 16.0* 11.8* 12.1*  HGB 12.4* 11.0* 10.7*  HCT 37.7* 33.5* 33.1*  MCV 100.3* 99.1 98.8  PLT 402* 350 341   Blood Culture No results found for: SDES, SPECREQUEST, CULT, REPTSTATUS  Cardiac Enzymes: Recent Labs  Lab 11/16/20 1326  CKTOTAL 82   CBG: Recent Labs  Lab 11/15/20 2249 11/16/20 0718 11/16/20 1116 11/16/20 1608 11/16/20 2140  GLUCAP 92 110* 100* 104* 105*   Iron Studies: No results for input(s): IRON, TIBC, TRANSFERRIN, FERRITIN in the last 72 hours. @lablastinr3 @ Studies/Results: DG Chest 2 View  Result Date: 11/15/2020 CLINICAL DATA:  Chest pain EXAM: CHEST - 2  VIEW COMPARISON:  None. FINDINGS: Cardiomegaly. Pulmonary vascular congestion. No pleural effusion or pneumothorax. No acute osseous abnormality. IMPRESSION: Pulmonary vascular congestion and cardiomegaly. Electronically Signed   By: Macy Mis M.D.   On: 11/15/2020 15:09   CT Head Wo Contrast  Result Date: 11/15/2020 CLINICAL DATA:  70 year old male with concern for intracranial hemorrhage. EXAM: CT HEAD WITHOUT CONTRAST TECHNIQUE: Contiguous axial images were obtained from the base of the skull through the vertex without intravenous contrast. COMPARISON:  None. FINDINGS: Brain: Mild age-related atrophy and chronic microvascular ischemic changes. There is no acute intracranial hemorrhage. No mass effect or midline shift. No extra-axial fluid collection. Vascular: No hyperdense vessel or unexpected calcification. Skull: Normal. Negative for fracture or focal lesion. Sinuses/Orbits: No acute finding. Other: None IMPRESSION: 1. No acute intracranial pathology. 2. Mild age-related atrophy and chronic microvascular ischemic changes. Electronically Signed   By: Anner Crete M.D.   On: 11/15/2020 16:57   MR THORACIC SPINE WO CONTRAST  Result Date: 11/16/2020 CLINICAL DATA:  Initial evaluation for mid scapular back pain, right greater than left, for 1 week. EXAM: MRI THORACIC SPINE WITHOUT CONTRAST TECHNIQUE: Multiplanar, multisequence MR imaging of the thoracic spine was performed. No intravenous contrast was administered. COMPARISON:  None. FINDINGS: Alignment: Mild exaggeration of the normal thoracic kyphosis. No listhesis. Vertebrae: Vertebral body height  maintained without acute or chronic fracture. Bone marrow signal intensity within normal limits. Subcentimeter benign hemangioma noted within the T9 vertebral body. No other discrete or worrisome osseous lesions. No abnormal marrow edema. Cord:  Normal signal and morphology. Paraspinal and other soft tissues: Paraspinous soft tissues within normal  limits. Trace layering bilateral pleural effusions noted. Disc levels: T6-7: Small right paracentral disc protrusion indents and partially flattens the right ventral thecal sac (series 21, image 17). Mild flattening of the right ventral cord without cord signal changes or significant spinal stenosis. Foramina remain patent. Otherwise, no other significant disc pathology seen for patient age. No other significant disc bulge or focal disc herniation. No canal or neural foraminal stenosis or evidence for neural impingement. IMPRESSION: 1. Small right paracentral disc protrusion at T6-7 with resultant mild flattening of the right ventral cord, but no significant stenosis. Finding could contribute to patient's symptoms. 2. Otherwise unremarkable MRI of the thoracic spine for age. 3. Trace layering bilateral pleural effusions. Electronically Signed   By: Jeannine Boga M.D.   On: 11/16/2020 23:01   CT Renal Stone Study  Result Date: 11/15/2020 CLINICAL DATA:  Flank pain. Urostomy 3 weeks ago. Bladder cancer. Hematuria. EXAM: CT ABDOMEN AND PELVIS WITHOUT CONTRAST TECHNIQUE: Multidetector CT imaging of the abdomen and pelvis was performed following the standard protocol without IV contrast. COMPARISON:  11/09/2020 FINDINGS: Lower chest: Bibasilar scarring. Mild cardiomegaly, without pericardial or pleural effusion. Right coronary artery calcification. Hepatobiliary: Normal noncontrast appearance of the liver. Stones in the gallbladder without acute cholecystitis or biliary duct dilatation. Pancreas: Normal, without mass or ductal dilatation. Spleen: Normal in size, without focal abnormality. Adrenals/Urinary Tract: Normal adrenal glands. Bilateral ureteric stents again identified. The left-sided stent originates in an extrarenal pelvis. No renal calculi or hydronephrosis. Cystectomy and ileal conduit creation. 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 Stomach/Bowel: Normal stomach, without wall thickening.  Descending duodenal diverticulum. Otherwise normal small bowel. Extensive colonic diverticulosis. Normal terminal ileum and appendix. Otherwise normal small bowel. Vascular/Lymphatic: Aortic atherosclerosis. No abdominopelvic adenopathy. Reproductive: Prostatectomy. Other: Small volume perisplenic fluid is slightly increased. Pelvic interloop mesenteric fluid is minimally increased, including on 67/2. Pelvic cul-de-sac fluid is similar. Again identified is fluid within a right inguinal hernia. Fat containing tiny left inguinal hernia. No free intraperitoneal air. Again identified is extensive subcutaneous emphysema about the abdominopelvic and lower thoracic walls. Similar in distribution, slightly decreased. Musculoskeletal: Left hip osteoarthritis. IMPRESSION: 1. Status post cystoprostatectomy with ileal conduit creation. Similar appearance of ureteric stents in place, without urinary tract calculi or hydronephrosis. 2. Similar to slight increase in small volume abdominopelvic fluid. Minimal decrease in extensive subcutaneous emphysema. 3. Right-sided fluid containing and left-sided fat containing inguinal hernias, similar. 4. Coronary artery atherosclerosis. Aortic Atherosclerosis (ICD10-I70.0). Electronically Signed   By: Abigail Miyamoto M.D.   On: 11/15/2020 17:09   Medications: .  sodium bicarbonate (isotonic) infusion in sterile water 100 mL/hr at 11/17/20 0309   . apixaban  5 mg Oral BID  . atorvastatin  10 mg Oral QHS  . diltiazem  240 mg Oral Daily  . docusate sodium  100 mg Oral Daily  . insulin aspart  0-5 Units Subcutaneous QHS  . insulin aspart  0-9 Units Subcutaneous TID WC  . metoprolol tartrate  25 mg Oral BID  . sodium zirconium cyclosilicate  10 g Oral Once    Assessment 25M AKI, hyperkalemia, NAG metabolic Acidosis in setting of cystoprostatectomy 10/27/20, progressive anorexia/hypovolemia, ongoing use of ACE inhibitor.  Most likely this  is a combination of hypovolemia and ongoing use  of enalapril.  Imaging is without obstruction with intact ureteral stents but urology consulted to make sure that no other concerns are identified.  1. AKI, baseline creatinine 1.0; likely hypovolemia+ACEi; nonoliguric and improving 2. 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 Recent hematuria into ileal conduit 4. A. fib on apixaban 5. Hyperkalemia, moderate, no EKG changes, status post medical management but persistant 6. NAG metabolic acidosis likely related to #1 and #2, resolved with isotonic bicarb 7. Mild hyponatremia, improving 8. History of systolic heart failure and CAD  Plan 1. D/c isotonic sodium bicarbonate today and allow to hydrate orally and take po bicarb for now.   2. Follow today - can give a diuretic if he develops an O2 requirement but hopefully d/c of fluids will prevent any further volume overload. 3. Do not resume ACE inhibitor at this time 4. Repeated lokelma dose this AM 5. Appreciate urology input - reviewed notes and plans for eval in place if AKI not resolving 6. Avoid hypotonic fluids, trend sodium 7. Avoid hypotension - on diltiazem and MTP for A fib  8. Daily weights, Daily Renal Panel, Strict I/Os, Avoid nephrotoxins (NSAIDs, judicious IV Contrast)    Jannifer Hick MD 11/17/2020, 7:04 AM  [facility] Pager: [phone]

## 2020-11-17 NOTE — Progress Notes (Signed)
Report called to [place], RN in stepdown unit.

## 2020-11-17 NOTE — Progress Notes (Signed)
PT Cancellation Note  Patient Details Name: Zachary Shannon. MRN: [id number] DOB: 26-Feb-1951   Cancelled Treatment:    Reason Eval/Treat Not Completed: Patient not medically ready   [facility] 11/17/2020, 12:35 PM

## 2020-11-17 NOTE — Progress Notes (Signed)
Patient ID: Zachary Hay., male   DOB: 08-25-1950, 70 y.o.   MRN: [id number]

## 2020-11-17 NOTE — Progress Notes (Signed)
Urine output 200cc 6a-6p. Attending notified. Directed to page nephrology. Nephrology paged and notified of urine output and recent bp. No response from page. rn will continue to monitor.

## 2020-11-17 NOTE — Progress Notes (Signed)
   11/17/20 1145  Assess: MEWS Score  Temp 98.2 F (36.8 C)  BP 114/64  Pulse Rate (!) 110  Resp (!) 22  SpO2 98 %  O2 Device Nasal Cannula  Patient Activity (if Appropriate) In bed  O2 Flow Rate (L/min) 2 L/min  Assess: MEWS Score  MEWS Temp 0  MEWS Systolic 0  MEWS Pulse 1  MEWS RR 1  MEWS LOC 0  MEWS Score 2  MEWS Score Color Yellow  Assess: if the MEWS score is Yellow or Red  Were vital signs taken at a resting state? Yes  Focused Assessment No change from prior assessment  Early Detection of Sepsis Score *See Row Information* Low  MEWS guidelines implemented *See Row Information* Yes  Treat  MEWS Interventions Administered prn meds/treatments  Complains of Other (Comment) (chest pressure)  Interventions Medication (see MAR) (nitro)  Patients response to intervention Decreased  Take Vital Signs  Increase Vital Sign Frequency  Yellow: Q 2hr X 2 then Q 4hr X 2, if remains yellow, continue Q 4hrs  Escalate  MEWS: Escalate Yellow: discuss with charge nurse/RN and consider discussing with provider and RRT  Notify: Charge Nurse/RN  Name of Charge Nurse/RN Notified Dekina RN  Date Charge Nurse/RN Notified 11/17/20  Time Charge Nurse/RN Notified 1146  Notify: Provider  Provider Name/Title Dr Tyrell Antonio  Date Provider Notified 11/17/20  Time Provider Notified 0315  Notification Type Face-to-face  Notification Reason Change in status  Provider response At bedside;See new orders  Date of Provider Response 11/17/20  Time of Provider Response 1147  Document  Patient Outcome Not stable and remains on department  Progress note created (see row info) Yes

## 2020-11-18 ENCOUNTER — Inpatient Hospital Stay (HOSPITAL_COMMUNITY): Payer: Medicare Other

## 2020-11-18 DIAGNOSIS — I361 Nonrheumatic tricuspid (valve) insufficiency: Secondary | ICD-10-CM | POA: Diagnosis not present

## 2020-11-18 DIAGNOSIS — R0789 Other chest pain: Secondary | ICD-10-CM

## 2020-11-18 DIAGNOSIS — N179 Acute kidney failure, unspecified: Secondary | ICD-10-CM | POA: Diagnosis not present

## 2020-11-18 DIAGNOSIS — R0609 Other forms of dyspnea: Secondary | ICD-10-CM | POA: Diagnosis not present

## 2020-11-18 LAB — CBC
HCT: 34.6 % — ABNORMAL LOW (ref 39.0–52.0)
Hemoglobin: 11.1 g/dL — ABNORMAL LOW (ref 13.0–17.0)
MCH: 32 pg (ref 26.0–34.0)
MCHC: 32.1 g/dL (ref 30.0–36.0)
MCV: 99.7 fL (ref 80.0–100.0)
Platelets: 345 10*3/uL (ref 150–400)
RBC: 3.47 MIL/uL — ABNORMAL LOW (ref 4.22–5.81)
RDW: 15.2 % (ref 11.5–15.5)
WBC: 14.7 10*3/uL — ABNORMAL HIGH (ref 4.0–10.5)
nRBC: 0 % (ref 0.0–0.2)

## 2020-11-18 LAB — RENAL FUNCTION PANEL
Albumin: 2.6 g/dL — ABNORMAL LOW (ref 3.5–5.0)
Anion gap: 10 (ref 5–15)
BUN: 90 mg/dL — ABNORMAL HIGH (ref 8–23)
CO2: 21 mmol/L — ABNORMAL LOW (ref 22–32)
Calcium: 8 mg/dL — ABNORMAL LOW (ref 8.9–10.3)
Chloride: 101 mmol/L (ref 98–111)
Creatinine, Ser: 4.17 mg/dL — ABNORMAL HIGH (ref 0.61–1.24)
GFR, Estimated: 15 mL/min — ABNORMAL LOW (ref >60)
Glucose, Bld: 105 mg/dL — ABNORMAL HIGH (ref 70–99)
Phosphorus: 4.5 mg/dL (ref 2.5–4.6)
Potassium: 5.1 mmol/L (ref 3.5–5.1)
Sodium: 132 mmol/L — ABNORMAL LOW (ref 135–145)

## 2020-11-18 LAB — ECHOCARDIOGRAM COMPLETE
AR max vel: 2.67 cm2
AV Area VTI: 2.83 cm2
AV Area mean vel: 2.43 cm2
AV Mean grad: 3 mmHg
AV Peak grad: 5.6 mmHg
Ao pk vel: 1.18 m/s
Area-P 1/2: 7.59 cm2
Calc EF: 53.4 %
Height: 72 in
S' Lateral: 3.1 cm
Single Plane A2C EF: 62.7 %
Single Plane A4C EF: 40.9 %
Weight: 4130.54 oz

## 2020-11-18 LAB — GLUCOSE, CAPILLARY
Glucose-Capillary: 83 mg/dL (ref 70–99)
Glucose-Capillary: 158 mg/dL — ABNORMAL HIGH (ref 70–99)
Glucose-Capillary: 140 mg/dL — ABNORMAL HIGH (ref 70–99)
Glucose-Capillary: 112 mg/dL — ABNORMAL HIGH (ref 70–99)

## 2020-11-18 MED ORDER — PANTOPRAZOLE SODIUM 40 MG PO TBEC
40.0000 mg | DELAYED_RELEASE_TABLET | Freq: Every day | ORAL | Status: DC
  Administered 2020-11-18 – 2020-11-25 (×8): 40 mg via ORAL
  Filled 2020-11-18 (×9): qty 1

## 2020-11-18 MED ORDER — LIDOCAINE 5 % EX PTCH
1.0000 | MEDICATED_PATCH | CUTANEOUS | Status: DC
  Administered 2020-11-18 – 2020-11-25 (×8): 1 via TRANSDERMAL
  Filled 2020-11-18 (×8): qty 1

## 2020-11-18 NOTE — Progress Notes (Addendum)
PROGRESS NOTE    Zachary Shannon.  YSA:[id number] DOB: [birth year] DOA: 11/15/2020 PCP: Minna Antis, DO   Brief Narrative: 70 year old with past medical history significant of bladder tumor status post recent cystoprostatectomy on April 6 with placement of ileal conduit and lateral stents.  Patient presented to the ER complaining of flank and suprapubic pain and confusion.  Patient was found to have a creatinine more than 6.  His creatinine was 1.1 when he left the hospital post surgery.  He has a history of paroxysmal A. fib, chronic systolic heart failure, CAD and hyperlipidemia he is currently taking Eliquis.  Family noticed progressive weakness.  Denies taking NSAID but has been taking 80s.  Patient admitted with acute renal failure, CT renal stone showed no acute findings.     Assessment & Plan:   Principal Problem:   AKI (acute kidney injury) ([place]) Active Problems:   Bladder cancer ([place])   PAF (paroxysmal atrial fibrillation) ([facility])   Hyperkalemia   Hyperlipidemia   1-AKI; creatinine baseline 1.0 Could be related to hypovolemia and ACE use, Pyelo.  Some improvement of renal function after IV fluids. Received bicarb drip. Nephrology and urology has been consulted. CK level normal.  Plan to treat for pyelo, due to back pain, leukocytosis. Monitor WBC Cr stable, down to 4.1.  Will defer fluids to nephrologist. Fluids stop yesterday due to concern for pulmonary edema.  2-Chest Pressure;  He complained of chest pressure, pain 4/27. Fluids has been discontinue by nephrologist.  Patient is on Eliquis, less likely PE, But VQ scan ordered, negative for PE>  Troponin flat, not significantly elevated.  ECHO pending.  Cardiology following.  Added PPI>   3-Back pain: He complains of back pain shoulder blade area mid back initially started on the left and now is on the right. -MRI showed mild disc protrussion. Discussed with Dr Arneta Cliche who review MRI, he doesn't see a  significant disc protrusion,  unlikely that will explain patient's pain.  -Continue with oxycodone as needed. -will add Lidocaine patch.  -VQ scan negative for PE>   4-A fib; continue with Eliquis, metoprolol. cardizem on hold due to hypotension yesterday.   5-Hyperkalemia;  -Received Lokelma and Bicarb change to oral.   6-Mild hyponatremia: Monitor.   7-Bladder cancer status post recent cystoprostatectomy and ileal conduit and lateral stent placement October 27, 2020. -Appreciate Dr Tresa Moore evaluation.  -plan to treat for pyelo.    8-Leukocytosis; reactive , improved. Plan to treat for pyelo Monitor.   Hypotension; transient. Responded to fluids.  Monitor step down.   Confusion; reported on admission, transient. Unspecific.   Estimated body mass index is 35.01 kg/m as calculated from the following:   Height as of this encounter: 6' (1.829 m).   Weight as of this encounter: 117.1 kg.   DVT prophylaxis: Eliquis Code Status: Full code Family Communication: Daughter at bedside 4/27 Disposition Plan:  Status is: Inpatient  Remains inpatient appropriate because:IV treatments appropriate due to intensity of illness or inability to take PO   Dispo: The patient is from: Home              Anticipated d/c is to: Home              Patient currently is not medically stable to d/c.   Difficult to place patient No        Consultants:   Urology  Nephrology   Procedures:     Antimicrobials:    Subjective: He is feeling  a little better. Still with back pain but controlled with pain medications.  He is currently chest pain, pressure free. Episodes less severe.  He is breathing well this am.   Objective: Vitals:   11/18/20 0300 11/18/20 0340 11/18/20 0500 11/18/20 0600  BP: 124/68   102/64  Pulse: (!) 107   88  Resp: (!) 21   15  Temp:  98 F (36.7 C)    TempSrc:  Oral    SpO2: 92%   96%  Weight:   117.1 kg   Height:        Intake/Output Summary (Last 24  hours) at 11/18/2020 0735 Last data filed at 11/18/2020 0500 Gross per 24 hour  Intake 1421.2 ml  Output 1125 ml  Net 296.2 ml   Filed Weights   11/17/20 0526 11/17/20 1546 11/18/20 0500  Weight: 120.1 kg 117.9 kg 117.1 kg    Examination:  General exam: NAD Respiratory system: CTA Cardiovascular system: S 1, S 2 IRR Gastrointestinal system: BS present, soft, nt, nd Central nervous system: Alert, following commands Extremities: no edema   Data Reviewed: I have personally reviewed following labs and imaging studies  CBC: Recent Labs  Lab 11/15/20 1427 11/16/20 0544 11/17/20 0449 11/18/20 0245  WBC 16.0* 11.8* 12.1* 14.7*  HGB 12.4* 11.0* 10.7* 11.1*  HCT 37.7* 33.5* 33.1* 34.6*  MCV 100.3* 99.1 98.8 99.7  PLT 402* 350 341 182   Basic Metabolic Panel: Recent Labs  Lab 11/15/20 1427 11/16/20 0544 11/16/20 1326 11/17/20 0449 11/18/20 0245  NA 131* 128* 129* 134* 132*  K 6.9* 5.7* 5.3* 5.3* 5.1  CL 104 102 100 100 101  CO2 15* 19* 19* 23 21*  GLUCOSE 109* 122* 100* 110* 105*  BUN 91* 94* 90* 92* 90*  CREATININE 6.49* 5.88*  5.82* 5.12* 4.34* 4.17*  CALCIUM 8.5* 7.9* 7.9* 8.1* 8.0*  PHOS  --   --   --  4.5 4.5   GFR: Estimated Creatinine Clearance: 21.8 mL/min (A) (by C-G formula based on SCr of 4.17 mg/dL (H)). Liver Function Tests: Recent Labs  Lab 11/17/20 0449 11/18/20 0245  ALBUMIN 2.5* 2.6*   No results for input(s): LIPASE, AMYLASE in the last 168 hours. No results for input(s): AMMONIA in the last 168 hours. Coagulation Profile: No results for input(s): INR, PROTIME in the last 168 hours. Cardiac Enzymes: Recent Labs  Lab 11/16/20 1326  CKTOTAL 82   BNP (last 3 results) No results for input(s): PROBNP in the last 8760 hours. HbA1C: Recent Labs    11/16/20 0544  HGBA1C 5.3   CBG: Recent Labs  Lab 11/17/20 0757 11/17/20 1129 11/17/20 1658 11/17/20 2126 11/18/20 0730  GLUCAP 104* 155* 101* 116* 83   Lipid Profile: No results  for input(s): CHOL, HDL, LDLCALC, TRIG, CHOLHDL, LDLDIRECT in the last 72 hours. Thyroid Function Tests: No results for input(s): TSH, T4TOTAL, FREET4, T3FREE, THYROIDAB in the last 72 hours. Anemia Panel: No results for input(s): VITAMINB12, FOLATE, FERRITIN, TIBC, IRON, RETICCTPCT in the last 72 hours. Sepsis Labs: No results for input(s): PROCALCITON, LATICACIDVEN in the last 168 hours.  Recent Results (from the past 240 hour(s))  Resp Panel by RT-PCR (Flu A&B, Covid) Nasopharyngeal Swab     Status: None   Collection Time: 11/15/20  6:33 PM   Specimen: Nasopharyngeal Swab; Nasopharyngeal(NP) swabs in vial transport medium  Result Value Ref Range Status   SARS Coronavirus 2 by RT PCR NEGATIVE NEGATIVE Final    Comment: (NOTE) SARS-CoV-2 target nucleic  acids are NOT DETECTED.  The SARS-CoV-2 RNA is generally detectable in upper respiratory specimens during the acute phase of infection. The lowest concentration of SARS-CoV-2 viral copies this assay can detect is 138 copies/mL. A negative result does not preclude SARS-Cov-2 infection and should not be used as the sole basis for treatment or other patient management decisions. A negative result may occur with  improper specimen collection/handling, submission of specimen other than nasopharyngeal swab, presence of viral mutation(s) within the areas targeted by this assay, and inadequate number of viral copies(<138 copies/mL). A negative result must be combined with clinical observations, patient history, and epidemiological information. The expected result is Negative.  Fact Sheet for Patients:  EntrepreneurPulse.com.au  Fact Sheet for Healthcare Providers:  IncredibleEmployment.be  This test is no t yet approved or cleared by the [place] FDA and  has been authorized for detection and/or diagnosis of SARS-CoV-2 by FDA under an Emergency Use Authorization (EUA). This EUA will remain  in  effect (meaning this test can be used) for the duration of the COVID-19 declaration under Section 564(b)(1) of the Act, 21 U.S.C.section 360bbb-3(b)(1), unless the authorization is terminated  or revoked sooner.       Influenza A by PCR NEGATIVE NEGATIVE Final   Influenza B by PCR NEGATIVE NEGATIVE Final    Comment: (NOTE) The Xpert Xpress SARS-CoV-2/FLU/RSV plus assay is intended as an aid in the diagnosis of influenza from Nasopharyngeal swab specimens and should not be used as a sole basis for treatment. Nasal washings and aspirates are unacceptable for Xpert Xpress SARS-CoV-2/FLU/RSV testing.  Fact Sheet for Patients: EntrepreneurPulse.com.au  Fact Sheet for Healthcare Providers: IncredibleEmployment.be  This test is not yet approved or cleared by the [place] FDA and has been authorized for detection and/or diagnosis of SARS-CoV-2 by FDA under an Emergency Use Authorization (EUA). This EUA will remain in effect (meaning this test can be used) for the duration of the COVID-19 declaration under Section 564(b)(1) of the Act, 21 U.S.C. section 360bbb-3(b)(1), unless the authorization is terminated or revoked.  Performed at [facility], [place] [address], [place], [place] [address]   MRSA PCR Screening     Status: None   Collection Time: 11/17/20  3:49 PM   Specimen: Nasal Mucosa; Nasopharyngeal  Result Value Ref Range Status   MRSA by PCR NEGATIVE NEGATIVE Final    Comment:        The GeneXpert MRSA Assay (FDA approved for NASAL specimens only), is one component of a comprehensive MRSA colonization surveillance program. It is not intended to diagnose MRSA infection nor to guide or monitor treatment for MRSA infections. Performed at [facility] [place] [address] [place], [place] [address]          Radiology Studies: MR THORACIC SPINE WO CONTRAST  Result Date: 11/16/2020 CLINICAL  DATA:  Initial evaluation for mid scapular back pain, right greater than left, for 1 week. EXAM: MRI THORACIC SPINE WITHOUT CONTRAST TECHNIQUE: Multiplanar, multisequence MR imaging of the thoracic spine was performed. No intravenous contrast was administered. COMPARISON:  None. FINDINGS: Alignment: Mild exaggeration of the normal thoracic kyphosis. No listhesis. Vertebrae: Vertebral body height maintained without acute or chronic fracture. Bone marrow signal intensity within normal limits. Subcentimeter benign hemangioma noted within the T9 vertebral body. No other discrete or worrisome osseous lesions. No abnormal marrow edema. Cord:  Normal signal and morphology. Paraspinal and other soft tissues: Paraspinous soft tissues within normal limits. Trace layering bilateral pleural effusions noted. Disc  levels: T6-7: Small right paracentral disc protrusion indents and partially flattens the right ventral thecal sac (series 21, image 17). Mild flattening of the right ventral cord without cord signal changes or significant spinal stenosis. Foramina remain patent. Otherwise, no other significant disc pathology seen for patient age. No other significant disc bulge or focal disc herniation. No canal or neural foraminal stenosis or evidence for neural impingement. IMPRESSION: 1. Small right paracentral disc protrusion at T6-7 with resultant mild flattening of the right ventral cord, but no significant stenosis. Finding could contribute to patient's symptoms. 2. Otherwise unremarkable MRI of the thoracic spine for age. 3. Trace layering bilateral pleural effusions. Electronically Signed   By: Jeannine Boga M.D.   On: 11/16/2020 23:01   NM Pulmonary Perfusion  Result Date: 11/17/2020 CLINICAL DATA:  70 year old male with chest pain and shortness of breath. EXAM: NUCLEAR MEDICINE PERFUSION LUNG SCAN TECHNIQUE: Perfusion images were obtained in multiple projections after intravenous injection of radiopharmaceutical.  Ventilation scans intentionally deferred if perfusion scan and chest x-ray adequate for interpretation during COVID 19 epidemic. RADIOPHARMACEUTICALS:  Chest radiograph dated 11/17/2020. MCi Tc-4m MAA IV COMPARISON:  None. FINDINGS: There is slight heterogeneous uptake, likely artifactual. No focal perfusion defect noted. IMPRESSION: No scintigraphic evidence of pulmonary embolism. Electronically Signed   By: Anner Crete M.D.   On: 11/17/2020 16:54   DG CHEST PORT 1 VIEW  Result Date: 11/17/2020 CLINICAL DATA:  Chest pain EXAM: PORTABLE CHEST 1 VIEW COMPARISON:  November 15, 2020. FINDINGS: Lungs are clear. Heart is mildly enlarged with pulmonary vascularity within normal limits. No adenopathy. There is aortic atherosclerosis. No bone lesions. IMPRESSION: Mild cardiomegaly. No edema or airspace opacity. Aortic Atherosclerosis (ICD10-I70.0). Electronically Signed   By: Lowella Grip III M.D.   On: 11/17/2020 12:03        Scheduled Meds: . apixaban  5 mg Oral BID  . atorvastatin  10 mg Oral QHS  . Chlorhexidine Gluconate Cloth  6 each Topical Daily  . docusate sodium  100 mg Oral Daily  . insulin aspart  0-5 Units Subcutaneous QHS  . insulin aspart  0-9 Units Subcutaneous TID WC  . mouth rinse  15 mL Mouth Rinse BID  . metoprolol tartrate  25 mg Oral BID  . sodium bicarbonate  1,300 mg Oral BID   Continuous Infusions: . cefTRIAXone (ROCEPHIN)  IV Stopped (11/17/20 1402)     LOS: 3 days    Time spent: 35 minutes    Desta Bujak A Josey Forcier, MD [facility]   If 7PM-7AM, please contact night-coverage [email or web address]  11/18/2020, 7:35 AM

## 2020-11-18 NOTE — Progress Notes (Signed)
[facility] Progress Note   Subjective:   UOP 1.1L yesterday and creatinine improved to 4.17 from 4.3 today.  Net neg 1.2L by I/Os for hospitalization.  Moved to stepdown for chest pain yesterday -  Cardiology consulted.  CXR cardiomegaly, no edema, VQ neg, trop flat. TTE pending.  CP resolved this AM.  Feeling fine.   Objective Vitals:   11/18/20 0800 11/18/20 0900 11/18/20 0940 11/18/20 1000  BP: 113/72 110/75 135/82 120/77  Pulse: 92 (!) 101 (!) 131 (!) 120  Resp: 16 (!) 21  (!) 21  Temp: 98.2 F (36.8 C)     TempSrc: Oral     SpO2: 98% 99%  (!) 85%  Weight:      Height:       Physical Exam General: obese man sitting in chair comfortably ENT:  MMM Heart: irreg irreg, no rub Lungs: normal WOB, clear to bases, on RQ Abdomen: soft, RLQ urostomy present, midline periumbilical incision c/d/ Extremities: no edema GU: blood tinged urine noted in ileal condiuit collection bag, no clots Neuro:  AOx3, conversant, no asterixis  Additional Objective Labs: Basic Metabolic Panel: Recent Labs  Lab 11/16/20 1326 11/17/20 0449 11/18/20 0245  NA 129* 134* 132*  K 5.3* 5.3* 5.1  CL 100 100 101  CO2 19* 23 21*  GLUCOSE 100* 110* 105*  BUN 90* 92* 90*  CREATININE 5.12* 4.34* 4.17*  CALCIUM 7.9* 8.1* 8.0*  PHOS  --  4.5 4.5   Liver Function Tests: Recent Labs  Lab 11/17/20 0449 11/18/20 0245  ALBUMIN 2.5* 2.6*   No results for input(s): LIPASE, AMYLASE in the last 168 hours. CBC: Recent Labs  Lab 11/15/20 1427 11/16/20 0544 11/17/20 0449 11/18/20 0245  WBC 16.0* 11.8* 12.1* 14.7*  HGB 12.4* 11.0* 10.7* 11.1*  HCT 37.7* 33.5* 33.1* 34.6*  MCV 100.3* 99.1 98.8 99.7  PLT 402* 350 341 345   Blood Culture    Component Value Date/Time   SDES  11/17/2020 1247    BLOOD RIGHT ANTECUBITAL Performed at [facility], [place] [address] [place], [place] [address]    [place]  11/17/2020 1247    BOTTLES DRAWN AEROBIC AND ANAEROBIC Blood  Culture adequate volume Performed at [facility], [place] [address] [place], [place] [address]    CULT  11/17/2020 1247    NO GROWTH < 12 HOURS Performed at [facility] [address] [place], [place] [address]    REPTSTATUS PENDING 11/17/2020 1247    Cardiac Enzymes: Recent Labs  Lab 11/16/20 1326  CKTOTAL 82   CBG: Recent Labs  Lab 11/17/20 0757 11/17/20 1129 11/17/20 1658 11/17/20 2126 11/18/20 0730  GLUCAP 104* 155* 101* 116* 83   Iron Studies: No results for input(s): IRON, TIBC, TRANSFERRIN, FERRITIN in the last 72 hours. @lablastinr3 @ Studies/Results: MR THORACIC SPINE WO CONTRAST  Result Date: 11/16/2020 CLINICAL DATA:  Initial evaluation for mid scapular back pain, right greater than left, for 1 week. EXAM: MRI THORACIC SPINE WITHOUT CONTRAST TECHNIQUE: Multiplanar, multisequence MR imaging of the thoracic spine was performed. No intravenous contrast was administered. COMPARISON:  None. FINDINGS: Alignment: Mild exaggeration of the normal thoracic kyphosis. No listhesis. Vertebrae: Vertebral body height maintained without acute or chronic fracture. Bone marrow signal intensity within normal limits. Subcentimeter benign hemangioma noted within the T9 vertebral body. No other discrete or worrisome osseous lesions. No abnormal marrow edema. Cord:  Normal signal and morphology. Paraspinal and other soft tissues: Paraspinous soft tissues within normal  limits. Trace layering bilateral pleural effusions noted. Disc levels: T6-7: Small right paracentral disc protrusion indents and partially flattens the right ventral thecal sac (series 21, image 17). Mild flattening of the right ventral cord without cord signal changes or significant spinal stenosis. Foramina remain patent. Otherwise, no other significant disc pathology seen for patient age. No other significant disc bulge or focal disc herniation. No canal or neural foraminal stenosis or evidence for neural  impingement. IMPRESSION: 1. Small right paracentral disc protrusion at T6-7 with resultant mild flattening of the right ventral cord, but no significant stenosis. Finding could contribute to patient's symptoms. 2. Otherwise unremarkable MRI of the thoracic spine for age. 3. Trace layering bilateral pleural effusions. Electronically Signed   By: Jeannine Boga M.D.   On: 11/16/2020 23:01   NM Pulmonary Perfusion  Result Date: 11/17/2020 CLINICAL DATA:  70 year old male with chest pain and shortness of breath. EXAM: NUCLEAR MEDICINE PERFUSION LUNG SCAN TECHNIQUE: Perfusion images were obtained in multiple projections after intravenous injection of radiopharmaceutical. Ventilation scans intentionally deferred if perfusion scan and chest x-ray adequate for interpretation during COVID 19 epidemic. RADIOPHARMACEUTICALS:  Chest radiograph dated 11/17/2020. MCi Tc-81m MAA IV COMPARISON:  None. FINDINGS: There is slight heterogeneous uptake, likely artifactual. No focal perfusion defect noted. IMPRESSION: No scintigraphic evidence of pulmonary embolism. Electronically Signed   By: Anner Crete M.D.   On: 11/17/2020 16:54   DG CHEST PORT 1 VIEW  Result Date: 11/17/2020 CLINICAL DATA:  Chest pain EXAM: PORTABLE CHEST 1 VIEW COMPARISON:  November 15, 2020. FINDINGS: Lungs are clear. Heart is mildly enlarged with pulmonary vascularity within normal limits. No adenopathy. There is aortic atherosclerosis. No bone lesions. IMPRESSION: Mild cardiomegaly. No edema or airspace opacity. Aortic Atherosclerosis (ICD10-I70.0). Electronically Signed   By: Lowella Grip III M.D.   On: 11/17/2020 12:03   ECHOCARDIOGRAM COMPLETE  Result Date: 11/18/2020    ECHOCARDIOGRAM REPORT   Patient Name:   Zachary Shannon. Date of Exam: 11/18/2020 Medical Rec #:  [id number]                 Height:       72.0 in Accession #:    [id number]                Weight:       258.2 lb Date of Birth:  Apr 10, 1951                 BSA:           2.375 m Patient Age:    14 years                  BP:           102/64 mmHg Patient Gender: M                         HR:           103 bpm. Exam Location:  Inpatient Procedure: 2D Echo, Color Doppler and Cardiac Doppler Indications:    Dyspnea  History:        Patient has no prior history of Echocardiogram examinations.                 Cardiomyopathy, CAD, Arrythmias:Atrial Fibrillation; Risk                 Factors:Hypertension and Dyslipidemia.  Sonographer:    Luisa Hart RDCS Referring Phys: [place]  IMPRESSIONS  1. Left ventricular ejection fraction, by estimation, is 55 to 60%. The left ventricle has normal function. The left ventricle has no regional wall motion abnormalities. Left ventricular diastolic function could not be evaluated.  2. Right ventricular systolic function is mildly reduced. The right ventricular size is mildly enlarged. There is moderately elevated pulmonary artery systolic pressure. The estimated right ventricular systolic pressure is 67.6 mmHg.  3. Left atrial size was severely dilated.  4. Right atrial size was moderately dilated.  5. The mitral valve is grossly normal. Trivial mitral valve regurgitation. No evidence of mitral stenosis.  6. The aortic valve is tricuspid. There is mild calcification of the aortic valve. There is mild thickening of the aortic valve. Aortic valve regurgitation is not visualized. No aortic stenosis is present.  7. Aortic dilatation noted. There is mild dilatation of the aortic root, measuring 42 mm. There is mild dilatation of the aortic root, measuring 43 mm.  8. The inferior vena cava is dilated in size with >50% respiratory variability, suggesting right atrial pressure of 8 mmHg. FINDINGS  Left Ventricle: Left ventricular ejection fraction, by estimation, is 55 to 60%. The left ventricle has normal function. The left ventricle has no regional wall motion abnormalities. The left ventricular internal cavity size was normal in size.  There is  no left ventricular hypertrophy. Left ventricular diastolic function could not be evaluated due to atrial fibrillation. Left ventricular diastolic function could not be evaluated. Right Ventricle: The right ventricular size is mildly enlarged. No increase in right ventricular wall thickness. Right ventricular systolic function is mildly reduced. There is moderately elevated pulmonary artery systolic pressure. The tricuspid regurgitant velocity is 3.39 m/s, and with an assumed right atrial pressure of 8 mmHg, the estimated right ventricular systolic pressure is 19.5 mmHg. Left Atrium: Left atrial size was severely dilated. Right Atrium: Right atrial size was moderately dilated. Pericardium: Trivial pericardial effusion is present. Presence of pericardial fat pad. Mitral Valve: The mitral valve is grossly normal. Mild mitral annular calcification. Trivial mitral valve regurgitation. No evidence of mitral valve stenosis. Tricuspid Valve: The tricuspid valve is grossly normal. Tricuspid valve regurgitation is mild . No evidence of tricuspid stenosis. Aortic Valve: The aortic valve is tricuspid. There is mild calcification of the aortic valve. There is mild thickening of the aortic valve. Aortic valve regurgitation is not visualized. No aortic stenosis is present. Aortic valve mean gradient measures 3.0 mmHg. Aortic valve peak gradient measures 5.6 mmHg. Aortic valve area, by VTI measures 2.83 cm. Pulmonic Valve: The pulmonic valve was grossly normal. Pulmonic valve regurgitation is not visualized. No evidence of pulmonic stenosis. Aorta: Aortic dilatation noted. There is mild dilatation of the aortic root, measuring 42 mm. There is mild dilatation of the aortic root, measuring 43 mm. Venous: The inferior vena cava is dilated in size with greater than 50% respiratory variability, suggesting right atrial pressure of 8 mmHg. IAS/Shunts: The atrial septum is grossly normal.  LEFT VENTRICLE PLAX 2D LVIDd:          5.10 cm LVIDs:         3.10 cm LV PW:         0.90 cm LV IVS:        0.90 cm LVOT diam:     2.30 cm LV SV:         54 LV SV Index:   23 LVOT Area:     4.15 cm  LV Volumes (MOD) LV vol d, MOD A2C: 77.7  ml LV vol d, MOD A4C: 113.0 ml LV vol s, MOD A2C: 29.0 ml LV vol s, MOD A4C: 66.8 ml LV SV MOD A2C:     48.7 ml LV SV MOD A4C:     113.0 ml LV SV MOD BP:      51.7 ml RIGHT VENTRICLE RV S prime:     13.10 cm/s TAPSE (M-mode): 1.5 cm LEFT ATRIUM              Index LA Vol (A2C):   187.0 ml 78.75 ml/m LA Vol (A4C):   108.0 ml 45.48 ml/m LA Biplane Vol: 150.0 ml 63.17 ml/m  AORTIC VALVE                   PULMONIC VALVE AV Area (Vmax):    2.67 cm    PV Vmax:       1.02 m/s AV Area (Vmean):   2.43 cm    PV Vmean:      75.600 cm/s AV Area (VTI):     2.83 cm    PV VTI:        0.189 m AV Vmax:           118.00 cm/s PV Peak grad:  4.2 mmHg AV Vmean:          80.400 cm/s PV Mean grad:  3.0 mmHg AV VTI:            0.191 m AV Peak Grad:      5.6 mmHg AV Mean Grad:      3.0 mmHg LVOT Vmax:         75.80 cm/s LVOT Vmean:        47.100 cm/s LVOT VTI:          0.130 m LVOT/AV VTI ratio: 0.68  AORTA Ao Root diam: 4.20 cm Ao Asc diam:  4.30 cm MITRAL VALVE               TRICUSPID VALVE MV Area (PHT): 7.59 cm    TR Peak grad:   46.0 mmHg MV Decel Time: 100 msec    TR Vmax:        339.00 cm/s MV E velocity: 92.75 cm/s                            SHUNTS                            Systemic VTI:  0.13 m                            Systemic Diam: 2.30 cm Eleonore Chiquito MD Electronically signed by Eleonore Chiquito MD Signature Date/Time: 11/18/2020/11:13:56 AM    Final    Medications: . cefTRIAXone (ROCEPHIN)  IV Stopped (11/17/20 1402)   . apixaban  5 mg Oral BID  . atorvastatin  10 mg Oral QHS  . Chlorhexidine Gluconate Cloth  6 each Topical Daily  . docusate sodium  100 mg Oral Daily  . insulin aspart  0-5 Units Subcutaneous QHS  . insulin aspart  0-9 Units Subcutaneous TID WC  . lidocaine  1 patch Transdermal Q24H  . mouth  rinse  15 mL Mouth Rinse BID  . metoprolol tartrate  25 mg Oral BID  . pantoprazole  40 mg Oral Daily  . sodium bicarbonate  1,300 mg Oral BID  Assessment 89M AKI, hyperkalemia, NAG metabolic Acidosis in setting of cystoprostatectomy 10/27/20, progressive anorexia/hypovolemia, ongoing use of ACE inhibitor.  Most likely this is a combination of hypovolemia and ongoing use of enalapril.  Imaging is without obstruction with intact ureteral stents but urology consulted to make sure that no other concerns are identified.  1. AKI, baseline creatinine 1.0; likely hypovolemia+ACEi; nonoliguric and improving slowly 2. 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 Recent hematuria into ileal conduit, urine blood tinged today 4. A. fib on apixaban Hyperkalemia, moderate, no EKG changes, status post medical management and resolved 5. NAG metabolic acidosis likely related to #1 and #2, resolved with bicarb supplementation 6. Mild hyponatremia, improving 7. History of systolic heart failure and CAD 8. Chest pain, not thought to be cardiac in nature.  Plan 1. Remain off IVF.  On 2L fluid restriction which I think is fine. 2. No indication for diuretic at the moment 3. Do not resume ACE inhibitor at this time 4. Appreciate urology input - reviewed notes and plans for eval in place if AKI not resolving 5. Avoid hypotonic fluids, trend sodium 6. Avoid hypotension - on diltiazem and MTP for A fib  7. Daily weights, Daily Renal Panel, Strict I/Os, Avoid nephrotoxins (NSAIDs, judicious IV Contrast)   Will follow, call with concerns.   Jannifer Hick MD 11/18/2020, 11:33 AM  [facility] Pager: [phone]

## 2020-11-18 NOTE — Progress Notes (Signed)
Occupational Therapy Treatment Patient Details Name: Zachary Shannon. MRN: [id number] DOB: [birth year] Today's Date: 11/18/2020    History of present illness 70 year old with PMH significant of bladder tumor status post recent cystoprostatectomy on April 6 with placement of ileal conduit and lateral stents.  Patient presented to the ER complaining of flank and suprapubic pain and confusion. Pt admitted for acute renal failure. Transferred to ICU 4/27 for cardiac monitoring   OT comments  Patient agreeable to getting up to chair. Patient min G for bed mobility, reports chest pressure with sitting upright. Patient min A with rolling walker to take few steps to recliner, limited activity tolerance and HR up to 136 with limited mobility. Will continue to follow to progress with self care tasks and overall activity tolerance to facilitate D/C home.    Follow Up Recommendations  Home health OT;Other (comment) (vs no f/u pending progress)    Equipment Recommendations  None recommended by OT       Precautions / Restrictions Precautions Precautions: Fall Precaution Comments: monitor HR/vitals Restrictions Weight Bearing Restrictions: No       Mobility Bed Mobility Overal bed mobility: Needs Assistance Bed Mobility: Supine to Sit     Supine to sit: Min guard;HOB elevated     General bed mobility comments: increased time    Transfers Overall transfer level: Needs assistance Equipment used: Rolling walker (2 wheeled) Transfers: Stand Pivot Transfers   Stand pivot transfers: Min assist       General transfer comment: min A for safety, limited activity tolerance with HR up to 136 after taking few steps to chair    Balance Overall balance assessment: Needs assistance Sitting-balance support: Feet supported Sitting balance-Leahy Scale: Good     Standing balance support: Bilateral upper extremity supported Standing balance-Leahy Scale: Poor Standing balance comment:  reliant on UE support                           ADL either performed or assessed with clinical judgement   ADL Overall ADL's : Needs assistance/impaired                         Toilet Transfer: Minimal assistance;Stand-pivot;Cueing for safety;RW Toilet Transfer Details (indicate cue type and reason): limited activity tolerance, HR up to 136 post transfer         Functional mobility during ADLs: Minimal assistance;Cueing for safety;Rolling walker General ADL Comments: patient continues to report chest pressure with sitting up, limited activity tolerance and HR up to 136 with minimal activity               Cognition Arousal/Alertness: Awake/alert Behavior During Therapy: WFL for tasks assessed/performed Overall Cognitive Status: Within Functional Limits for tasks assessed                                                     Pertinent Vitals/ Pain       Pain Assessment: Faces Faces Pain Scale: Hurts little more Pain Location: back, chest Pain Descriptors / Indicators: Pressure Pain Intervention(s): Monitored during session         Frequency  Min 2X/week        Progress Toward Goals  OT Goals(current goals can now be found in the care plan section)  Progress  towards OT goals: Progressing toward goals  Acute Rehab OT Goals Patient Stated Goal: feel better OT Goal Formulation: With patient Time For Goal Achievement: 12/01/20 Potential to Achieve Goals: Good ADL Goals Pt Will Perform Grooming: Independently;standing Pt Will Perform Lower Body Dressing: Independently;sit to/from stand;sitting/lateral leans Pt Will Transfer to Toilet: Independently;ambulating Pt Will Perform Toileting - Clothing Manipulation and hygiene: Independently;sitting/lateral leans;sit to/from stand  Plan Discharge plan remains appropriate    Co-evaluation    PT/OT/SLP Co-Evaluation/Treatment: Yes Reason for Co-Treatment: To address  functional/ADL transfers   OT goals addressed during session: ADL's and self-care      AM-PAC OT "6 Clicks" Daily Activity     Outcome Measure   Help from another person eating meals?: None Help from another person taking care of personal grooming?: A Little Help from another person toileting, which includes using toliet, bedpan, or urinal?: A Little Help from another person bathing (including washing, rinsing, drying)?: A Lot Help from another person to put on and taking off regular upper body clothing?: A Little Help from another person to put on and taking off regular lower body clothing?: A Lot 6 Click Score: 17    End of Session Equipment Utilized During Treatment: Rolling walker;Gait belt;Oxygen  OT Visit Diagnosis: Other abnormalities of gait and mobility (R26.89);Pain Pain - part of body:  (back, chest)   Activity Tolerance Patient limited by fatigue;Other (comment) (HR)   Patient Left in chair;with call bell/phone within reach;with chair alarm set   Nurse Communication Mobility status        Time: 4765-4650 OT Time Calculation (min): 20 min  Charges: OT General Charges $OT Visit: 1 Visit OT Treatments $Self Care/Home Management : 8-22 mins  Delbert Phenix OT OT pager: [phone]   Rosemary Holms 11/18/2020, 10:49 AM

## 2020-11-18 NOTE — Progress Notes (Signed)
*  PRELIMINARY RESULTS* Echocardiogram 2D Echocardiogram has been performed.  Zachary Shannon 11/18/2020, 8:52 AM

## 2020-11-18 NOTE — Progress Notes (Signed)
DAILY PROGRESS NOTE   Patient Name: Zachary Shannon. Date of Encounter: 11/18/2020 Cardiologist: No primary care provider on file.  Chief Complaint   No chest pain  Patient Profile    Zachary Shannon. is a 70 y.o. male with a hx of Afib on eliquis, ischemic heart disease, CAD, chronic systolic heart failure, MR, AR, HTN, HLD, bladder cancer, and former smoker who is being seen today for the evaluation of chest pain at the request of Dr. Tyrell Antonio.  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 V/Q study was negative for PE. Creatinine is improving - followed by nephrology. BP improved today.  Objective   Vitals:   11/18/20 0940 11/18/20 1000 11/18/20 1100 11/18/20 1200  BP: 135/82 120/77 (!) 101/50 115/70  Pulse: (!) 131 (!) 120 94 92  Resp:  (!) 21 17 20   Temp:      TempSrc:      SpO2:  (!) 85% (!) 89% 93%  Weight:      Height:        Intake/Output Summary (Last 24 hours) at 11/18/2020 1308 Last data filed at 11/18/2020 1252 Gross per 24 hour  Intake 1697.2 ml  Output 1125 ml  Net 572.2 ml   Filed Weights   11/17/20 0526 11/17/20 1546 11/18/20 0500  Weight: 120.1 kg 117.9 kg 117.1 kg    Physical Exam   General appearance: alert and no distress Neck: no carotid bruit, no JVD and thyroid not enlarged, symmetric, no tenderness/mass/nodules Lungs: clear to auscultation bilaterally Heart: regular rate and rhythm Abdomen: soft, non-tender; bowel sounds normal; no masses,  no organomegaly and obese Extremities: extremities normal, atraumatic, no cyanosis or edema Pulses: 2+ and symmetric Skin: Skin color, texture, turgor normal. No rashes or lesions Neurologic: Grossly normal Psych: Pleasant  Inpatient  Medications    Scheduled Meds:  . apixaban  5 mg Oral BID  . atorvastatin  10 mg Oral QHS  . Chlorhexidine Gluconate Cloth  6 each Topical Daily  . docusate sodium  100 mg Oral Daily  . insulin aspart  0-5 Units Subcutaneous QHS  . insulin aspart  0-9 Units Subcutaneous TID WC  . lidocaine  1 patch Transdermal Q24H  . mouth rinse  15 mL Mouth Rinse BID  . metoprolol tartrate  25 mg Oral BID  . pantoprazole  40 mg Oral Daily  . sodium bicarbonate  1,300 mg Oral BID    Continuous Infusions: . cefTRIAXone (ROCEPHIN)  IV Stopped (11/17/20 1402)    PRN Meds: acetaminophen **OR** acetaminophen, calcium carbonate (dosed in mg elemental calcium), camphor-menthol **AND** hydrOXYzine, docusate sodium, hydrALAZINE, HYDROmorphone, labetalol, nitroGLYCERIN, ondansetron **OR** ondansetron (ZOFRAN) IV, oxyCODONE-acetaminophen, sorbitol, zolpidem   Labs   Results for orders placed or performed during the hospital encounter of 11/15/20 (from the past 48 hour(s))  Basic metabolic panel     Status: Abnormal   Collection Time: 11/16/20  1:26 PM  Result Value Ref Range   Sodium 129 (L) 135 - 145 mmol/L   Potassium 5.3 (H) 3.5 - 5.1 mmol/L   Chloride 100 98 - 111 mmol/L   CO2 19 (L) 22 - 32 mmol/L   Glucose, Bld 100 (H) 70 - 99 mg/dL    Comment: Glucose reference range applies  only to samples taken after fasting for at least 8 hours.   BUN 90 (H) 8 - 23 mg/dL   Creatinine, Ser 5.12 (H) 0.61 - 1.24 mg/dL   Calcium 7.9 (L) 8.9 - 10.3 mg/dL   GFR, Estimated 11 (L) >60 mL/min    Comment: (NOTE) Calculated using the CKD-EPI Creatinine Equation (2021)    Anion gap 10 5 - 15    Comment: Performed at [facility], [place] [address], [place], [place] [address]  CK     Status: None   Collection Time: 11/16/20  1:26 PM  Result Value Ref Range   Total CK 82 49 - 397 U/L    Comment: Performed at [facility], [place] [address], [place], [place] [address]  Glucose,  capillary     Status: Abnormal   Collection Time: 11/16/20  4:08 PM  Result Value Ref Range   Glucose-Capillary 104 (H) 70 - 99 mg/dL    Comment: Glucose reference range applies only to samples taken after fasting for at least 8 hours.   Comment 1 Notify RN    Comment 2 Document in Chart   Glucose, capillary     Status: Abnormal   Collection Time: 11/16/20  9:40 PM  Result Value Ref Range   Glucose-Capillary 105 (H) 70 - 99 mg/dL    Comment: Glucose reference range applies only to samples taken after fasting for at least 8 hours.  Renal function panel     Status: Abnormal   Collection Time: 11/17/20  4:49 AM  Result Value Ref Range   Sodium 134 (L) 135 - 145 mmol/L   Potassium 5.3 (H) 3.5 - 5.1 mmol/L   Chloride 100 98 - 111 mmol/L   CO2 23 22 - 32 mmol/L   Glucose, Bld 110 (H) 70 - 99 mg/dL    Comment: Glucose reference range applies only to samples taken after fasting for at least 8 hours.   BUN 92 (H) 8 - 23 mg/dL   Creatinine, Ser 4.34 (H) 0.61 - 1.24 mg/dL   Calcium 8.1 (L) 8.9 - 10.3 mg/dL   Phosphorus 4.5 2.5 - 4.6 mg/dL   Albumin 2.5 (L) 3.5 - 5.0 g/dL   GFR, Estimated 14 (L) >60 mL/min    Comment: (NOTE) Calculated using the CKD-EPI Creatinine Equation (2021)    Anion gap 11 5 - 15    Comment: Performed at [facility], [place] [address], [place], [place] [address]  CBC     Status: Abnormal   Collection Time: 11/17/20  4:49 AM  Result Value Ref Range   WBC 12.1 (H) 4.0 - 10.5 K/uL   RBC 3.35 (L) 4.22 - 5.81 MIL/uL   Hemoglobin 10.7 (L) 13.0 - 17.0 g/dL   HCT 33.1 (L) 39.0 - 52.0 %   MCV 98.8 80.0 - 100.0 fL   MCH 31.9 26.0 - 34.0 pg   MCHC 32.3 30.0 - 36.0 g/dL   RDW 15.1 11.5 - 15.5 %   Platelets 341 150 - 400 K/uL   nRBC 0.0 0.0 - 0.2 %    Comment: Performed at [facility], [place] [address] [place], [place] [address]  Troponin I (High Sensitivity)     Status: Abnormal   Collection Time: 11/17/20  4:49 AM  Result Value Ref  Range   Troponin I (High Sensitivity) 26 (H) <18 ng/L    Comment: (NOTE) Elevated high sensitivity troponin I (hsTnI) values and significant  changes across serial  measurements may suggest ACS but many other  chronic and acute conditions are known to elevate hsTnI results.  Refer to the "Links" section for chest pain algorithms and additional  guidance. Performed at [facility], [place] [address], [place], [place] [address]   Glucose, capillary     Status: Abnormal   Collection Time: 11/17/20  7:57 AM  Result Value Ref Range   Glucose-Capillary 104 (H) 70 - 99 mg/dL    Comment: Glucose reference range applies only to samples taken after fasting for at least 8 hours.  Glucose, capillary     Status: Abnormal   Collection Time: 11/17/20 11:29 AM  Result Value Ref Range   Glucose-Capillary 155 (H) 70 - 99 mg/dL    Comment: Glucose reference range applies only to samples taken after fasting for at least 8 hours.  Troponin I (High Sensitivity)     Status: Abnormal   Collection Time: 11/17/20 12:46 PM  Result Value Ref Range   Troponin I (High Sensitivity) 30 (H) <18 ng/L    Comment: (NOTE) Elevated high sensitivity troponin I (hsTnI) values and significant  changes across serial measurements may suggest ACS but many other  chronic and acute conditions are known to elevate hsTnI results.  Refer to the "Links" section for chest pain algorithms and additional  guidance. Performed at [facility], [place] [address], [place], [place] [address]   Culture, blood (routine x 2)     Status: None (Preliminary result)   Collection Time: 11/17/20 12:47 PM   Specimen: BLOOD  Result Value Ref Range   Specimen Description      BLOOD RIGHT ANTECUBITAL Performed at [facility], [place] [address], [place], [place] [address]    Special Requests      BOTTLES DRAWN AEROBIC AND ANAEROBIC Blood Culture adequate volume Performed at [place] [address] [place], [place] [address]    Culture      NO GROWTH < 12 HOURS Performed at [place] [address] [place],  [address]    Report Status PENDING   MRSA PCR Screening     Status: None   Collection Time: 11/17/20  3:49 PM   Specimen: Nasal Mucosa; Nasopharyngeal  Result Value Ref Range   MRSA by PCR NEGATIVE NEGATIVE    Comment:        The GeneXpert MRSA Assay (FDA approved for NASAL specimens only), is one component of a comprehensive MRSA colonization surveillance program. It is not intended to diagnose MRSA infection nor to guide or monitor treatment for MRSA infections. Performed at [facility], [place] [address] [place],  [address]   Glucose, capillary     Status: Abnormal   Collection Time: 11/17/20  4:58 PM  Result Value Ref Range   Glucose-Capillary 101 (H) 70 - 99 mg/dL    Comment: Glucose reference range applies only to samples taken after fasting for at least 8 hours.  Glucose, capillary     Status: Abnormal   Collection Time: 11/17/20  9:26 PM  Result Value Ref Range   Glucose-Capillary 116 (H) 70 - 99 mg/dL    Comment: Glucose reference range applies only to samples taken after fasting for at least 8 hours.  Renal function panel     Status: Abnormal   Collection Time: 11/18/20  2:45 AM  Result Value Ref Range   Sodium 132 (L) 135 - 145 mmol/L   Potassium 5.1 3.5 -  5.1 mmol/L   Chloride 101 98 - 111 mmol/L   CO2 21 (L) 22 - 32 mmol/L   Glucose, Bld 105 (H) 70 - 99 mg/dL    Comment: Glucose reference range applies only to samples taken after fasting for at least 8 hours.   BUN 90 (H) 8 - 23 mg/dL   Creatinine, Ser 1.09 (H) 0.61 - 1.24 mg/dL   Calcium 8.0 (L) 8.9 - 10.3 mg/dL   Phosphorus 4.5 2.5 - 4.6 mg/dL   Albumin 2.6 (L) 3.5 - 5.0 g/dL   GFR, Estimated 15 (L) >60 mL/min    Comment: (NOTE) Calculated using the CKD-EPI Creatinine Equation (2021)    Anion gap 10 5 - 15    Comment:  Performed at [facility] [address] [place], [place] [address]  CBC     Status: Abnormal   Collection Time: 11/18/20  2:45 AM  Result Value Ref Range   WBC 14.7 (H) 4.0 - 10.5 K/uL   RBC 3.47 (L) 4.22 - 5.81 MIL/uL   Hemoglobin 11.1 (L) 13.0 - 17.0 g/dL   HCT 73.2 (L) 20.2 - 54.2 %   MCV 99.7 80.0 - 100.0 fL   MCH 32.0 26.0 - 34.0 pg   MCHC 32.1 30.0 - 36.0 g/dL   RDW 70.6 23.7 - 62.8 %   Platelets 345 150 - 400 K/uL   nRBC 0.0 0.0 - 0.2 %    Comment: Performed at [facility], [address] [place], [place] [address]  Glucose, capillary     Status: None   Collection Time: 11/18/20  7:30 AM  Result Value Ref Range   Glucose-Capillary 83 70 - 99 mg/dL    Comment: Glucose reference range applies only to samples taken after fasting for at least 8 hours.  Glucose, capillary     Status: Abnormal   Collection Time: 11/18/20  1:00 PM  Result Value Ref Range   Glucose-Capillary 158 (H) 70 - 99 mg/dL    Comment: Glucose reference range applies only to samples taken after fasting for at least 8 hours.    ECG   N/A  Telemetry   Sinus rhythm - Personally Reviewed  Radiology    MR THORACIC SPINE WO CONTRAST  Result Date: 11/16/2020 CLINICAL DATA:  Initial evaluation for mid scapular back pain, right greater than left, for 1 week. EXAM: MRI THORACIC SPINE WITHOUT CONTRAST TECHNIQUE: Multiplanar, multisequence MR imaging of the thoracic spine was performed. No intravenous contrast was administered. COMPARISON:  None. FINDINGS: Alignment: Mild exaggeration of the normal thoracic kyphosis. No listhesis. Vertebrae: Vertebral body height maintained without acute or chronic fracture. Bone marrow signal intensity within normal limits. Subcentimeter benign hemangioma noted within the T9 vertebral body. No other discrete or worrisome osseous lesions. No abnormal marrow edema. Cord:  Normal signal and morphology. Paraspinal and other soft tissues:  Paraspinous soft tissues within normal limits. Trace layering bilateral pleural effusions noted. Disc levels: T6-7: Small right paracentral disc protrusion indents and partially flattens the right ventral thecal sac (series 21, image 17). Mild flattening of the right ventral cord without cord signal changes or significant spinal stenosis. Foramina remain patent. Otherwise, no other significant disc pathology seen for patient age. No other significant disc bulge or focal disc herniation. No canal or neural foraminal stenosis or evidence for neural impingement. IMPRESSION: 1. Small right paracentral disc protrusion at T6-7 with resultant mild flattening of the right ventral cord, but no significant stenosis. Finding could contribute to  patient's symptoms. 2. Otherwise unremarkable MRI of the thoracic spine for age. 3. Trace layering bilateral pleural effusions. Electronically Signed   By: Jeannine Boga M.D.   On: 11/16/2020 23:01   NM Pulmonary Perfusion  Result Date: 11/17/2020 CLINICAL DATA:  70 year old male with chest pain and shortness of breath. EXAM: NUCLEAR MEDICINE PERFUSION LUNG SCAN TECHNIQUE: Perfusion images were obtained in multiple projections after intravenous injection of radiopharmaceutical. Ventilation scans intentionally deferred if perfusion scan and chest x-ray adequate for interpretation during COVID 19 epidemic. RADIOPHARMACEUTICALS:  Chest radiograph dated 11/17/2020. MCi Tc-56m MAA IV COMPARISON:  None. FINDINGS: There is slight heterogeneous uptake, likely artifactual. No focal perfusion defect noted. IMPRESSION: No scintigraphic evidence of pulmonary embolism. Electronically Signed   By: Anner Crete M.D.   On: 11/17/2020 16:54   DG CHEST PORT 1 VIEW  Result Date: 11/17/2020 CLINICAL DATA:  Chest pain EXAM: PORTABLE CHEST 1 VIEW COMPARISON:  November 15, 2020. FINDINGS: Lungs are clear. Heart is mildly enlarged with pulmonary vascularity within normal limits. No adenopathy.  There is aortic atherosclerosis. No bone lesions. IMPRESSION: Mild cardiomegaly. No edema or airspace opacity. Aortic Atherosclerosis (ICD10-I70.0). Electronically Signed   By: Lowella Grip III M.D.   On: 11/17/2020 12:03   ECHOCARDIOGRAM COMPLETE  Result Date: 11/18/2020    ECHOCARDIOGRAM REPORT   Patient Name:   Patrich Heinze. Date of Exam: 11/18/2020 Medical Rec #:  [id number]                 Height:       72.0 in Accession #:    [id number]                Weight:       258.2 lb Date of Birth:  02/19/1951                 BSA:          2.375 m Patient Age:    70 years                  BP:           102/64 mmHg Patient Gender: M                         HR:           103 bpm. Exam Location:  Inpatient Procedure: 2D Echo, Color Doppler and Cardiac Doppler Indications:    Dyspnea  History:        Patient has no prior history of Echocardiogram examinations.                 Cardiomyopathy, CAD, Arrythmias:Atrial Fibrillation; Risk                 Factors:Hypertension and Dyslipidemia.  Sonographer:    Luisa Hart RDCS Referring Phys: [id number] BELKYS A REGALADO IMPRESSIONS  1. Left ventricular ejection fraction, by estimation, is 55 to 60%. The left ventricle has normal function. The left ventricle has no regional wall motion abnormalities. Left ventricular diastolic function could not be evaluated.  2. Right ventricular systolic function is mildly reduced. The right ventricular size is mildly enlarged. There is moderately elevated pulmonary artery systolic pressure. The estimated right ventricular systolic pressure is 14.4 mmHg.  3. Left atrial size was severely dilated.  4. Right atrial size was moderately dilated.  5. The mitral valve is grossly normal. Trivial mitral valve regurgitation. No evidence of mitral stenosis.  6. The aortic valve is tricuspid. There is mild calcification of the aortic valve. There is mild thickening of the aortic valve. Aortic valve regurgitation is not visualized. No aortic  stenosis is present.  7. Aortic dilatation noted. There is mild dilatation of the aortic root, measuring 42 mm. There is mild dilatation of the aortic root, measuring 43 mm.  8. The inferior vena cava is dilated in size with >50% respiratory variability, suggesting right atrial pressure of 8 mmHg. FINDINGS  Left Ventricle: Left ventricular ejection fraction, by estimation, is 55 to 60%. The left ventricle has normal function. The left ventricle has no regional wall motion abnormalities. The left ventricular internal cavity size was normal in size. There is  no left ventricular hypertrophy. Left ventricular diastolic function could not be evaluated due to atrial fibrillation. Left ventricular diastolic function could not be evaluated. Right Ventricle: The right ventricular size is mildly enlarged. No increase in right ventricular wall thickness. Right ventricular systolic function is mildly reduced. There is moderately elevated pulmonary artery systolic pressure. The tricuspid regurgitant velocity is 3.39 m/s, and with an assumed right atrial pressure of 8 mmHg, the estimated right ventricular systolic pressure is 16.1 mmHg. Left Atrium: Left atrial size was severely dilated. Right Atrium: Right atrial size was moderately dilated. Pericardium: Trivial pericardial effusion is present. Presence of pericardial fat pad. Mitral Valve: The mitral valve is grossly normal. Mild mitral annular calcification. Trivial mitral valve regurgitation. No evidence of mitral valve stenosis. Tricuspid Valve: The tricuspid valve is grossly normal. Tricuspid valve regurgitation is mild . No evidence of tricuspid stenosis. Aortic Valve: The aortic valve is tricuspid. There is mild calcification of the aortic valve. There is mild thickening of the aortic valve. Aortic valve regurgitation is not visualized. No aortic stenosis is present. Aortic valve mean gradient measures 3.0 mmHg. Aortic valve peak gradient measures 5.6 mmHg. Aortic valve  area, by VTI measures 2.83 cm. Pulmonic Valve: The pulmonic valve was grossly normal. Pulmonic valve regurgitation is not visualized. No evidence of pulmonic stenosis. Aorta: Aortic dilatation noted. There is mild dilatation of the aortic root, measuring 42 mm. There is mild dilatation of the aortic root, measuring 43 mm. Venous: The inferior vena cava is dilated in size with greater than 50% respiratory variability, suggesting right atrial pressure of 8 mmHg. IAS/Shunts: The atrial septum is grossly normal.  LEFT VENTRICLE PLAX 2D LVIDd:         5.10 cm LVIDs:         3.10 cm LV PW:         0.90 cm LV IVS:        0.90 cm LVOT diam:     2.30 cm LV SV:         54 LV SV Index:   23 LVOT Area:     4.15 cm  LV Volumes (MOD) LV vol d, MOD A2C: 77.7 ml LV vol d, MOD A4C: 113.0 ml LV vol s, MOD A2C: 29.0 ml LV vol s, MOD A4C: 66.8 ml LV SV MOD A2C:     48.7 ml LV SV MOD A4C:     113.0 ml LV SV MOD BP:      51.7 ml RIGHT VENTRICLE RV S prime:     13.10 cm/s TAPSE (M-mode): 1.5 cm LEFT ATRIUM              Index LA Vol (A2C):   187.0 ml 78.75 ml/m LA Vol (A4C):   108.0 ml 45.48 ml/m LA  Biplane Vol: 150.0 ml 63.17 ml/m  AORTIC VALVE                   PULMONIC VALVE AV Area (Vmax):    2.67 cm    PV Vmax:       1.02 m/s AV Area (Vmean):   2.43 cm    PV Vmean:      75.600 cm/s AV Area (VTI):     2.83 cm    PV VTI:        0.189 m AV Vmax:           118.00 cm/s PV Peak grad:  4.2 mmHg AV Vmean:          80.400 cm/s PV Mean grad:  3.0 mmHg AV VTI:            0.191 m AV Peak Grad:      5.6 mmHg AV Mean Grad:      3.0 mmHg LVOT Vmax:         75.80 cm/s LVOT Vmean:        47.100 cm/s LVOT VTI:          0.130 m LVOT/AV VTI ratio: 0.68  AORTA Ao Root diam: 4.20 cm Ao Asc diam:  4.30 cm MITRAL VALVE               TRICUSPID VALVE MV Area (PHT): 7.59 cm    TR Peak grad:   46.0 mmHg MV Decel Time: 100 msec    TR Vmax:        339.00 cm/s MV E velocity: 92.75 cm/s                            SHUNTS                            Systemic  VTI:  0.13 m                            Systemic Diam: 2.30 cm Eleonore Chiquito MD Electronically signed by Eleonore Chiquito MD Signature Date/Time: 11/18/2020/11:13:56 AM    Final     Cardiac Studies   See echo above  Assessment   1. Principal Problem: 2.   AKI (acute kidney injury) ([place]) 3. Active Problems: 4.   Bladder cancer ([place]) 5.   PAF (paroxysmal atrial fibrillation) ([place]) 6.   Hyperkalemia 7.   Hyperlipidemia 8.   Plan   1. No further chest pain- troponin flat mildly elevated. Echo reassuring with no new WMA's and LVEF has normalized. 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 This will require follow-up with his cardiologist in [place] after discharge. At this point, no further testing is necessary from a cardiac standpoint.  [facility] will sign off.   Medication Recommendations:  Continue current meds Other recommendations (labs, testing, etc):  none Follow up as an outpatient:  Cardiologist with [facility]  Time Spent Directly with Patient:  I have spent a total of 25 minutes with the patient reviewing hospital notes, telemetry, EKGs, labs and examining the patient as well as establishing an assessment and plan that was discussed personally with the patient.  > 50% of time was spent in direct patient care.  Length of Stay:  LOS: 3 days   Pixie Casino, MD, [facility], FACP  [place] Director of the Advanced Lipid Disorders &  Cardiovascular Risk Reduction Clinic Diplomate of the American Board of Clinical Lipidology Attending Cardiologist  Direct Dial: [phone]  Fax: [phone]  Website:  [email or web address] Janson Lamar 11/18/2020, 1:08 PM

## 2020-11-18 NOTE — Evaluation (Signed)
Physical Therapy Evaluation Patient Details Name: Zachary Shannon. MRN: [id number] DOB: 01/22/1951 Today's Date: 11/18/2020   History of Present Illness  70 year old with PMH significant of bladder tumor status post recent cystoprostatectomy on April 6 with placement of ileal conduit and lateral stents.  Patient presented to the ER complaining of flank and suprapubic pain and confusion. Pt admitted for acute renal failure. Transferred to ICU 4/27 for cardiac monitoring  Clinical Impression  The patient reports chest pressure when sitting  Up. No dizziness complaints. Patient mobilized to sitting with min asisstance. Mod steady assistance using Rw to take small shuffle steps to recliner.  Max HR 136. SPO2 >95% on 2 L.  Patient lives alone with family assisting as needed. Pt admitted with above diagnosis.  Pt currently with functional limitations due to the deficits listed below (see PT Problem List). Pt will benefit from skilled PT to increase their independence and safety with mobility to allow discharge to the venue listed below.       Follow Up Recommendations Home health PT if has 24/7 assistance.    Equipment Recommendations    none   Recommendations for Other Services       Precautions / Restrictions Precautions Precautions: Fall Precaution Comments: monitor HR/vitals, colostomy Restrictions Weight Bearing Restrictions: No      Mobility  Bed Mobility Overal bed mobility: Needs Assistance Bed Mobility: Supine to Sit     Supine to sit: Min guard;HOB elevated     General bed mobility comments: increased time, reaching for footboard.    Transfers Overall transfer level: Needs assistance Equipment used: Rolling walker (2 wheeled) Transfers: Stand Pivot Transfers Sit to Stand: Min guard;From elevated surface Stand pivot transfers: Min assist       General transfer comment: min A for safety, limited activity tolerance with HR up to 136 after taking few steps  to chair, small shuffle steps  to go x 4 ' to recliner, steady assistnace  Ambulation/Gait                Stairs            Wheelchair Mobility    Modified Rankin (Stroke Patients Only)       Balance Overall balance assessment: Needs assistance Sitting-balance support: Feet supported Sitting balance-Leahy Scale: Good     Standing balance support: Bilateral upper extremity supported Standing balance-Leahy Scale: Poor Standing balance comment: reliant on UE support                             Pertinent Vitals/Pain Pain Assessment: Faces Faces Pain Scale: Hurts even more Pain Location: back, chest Pain Descriptors / Indicators: Pressure Pain Intervention(s): Monitored during session;Premedicated before session;Limited activity within patient's tolerance    Home Living Family/patient expects to be discharged to:: Private residence Living Arrangements: Alone Available Help at Discharge: Family;Available 24 hours/day Type of Home: House Home Access: Other (comment)     Home Layout: One level Home Equipment: Shower seat - built in;Walker - 2 wheels Additional Comments: Pt's 2 daughters and 1 step son plan to stay with him 24/7 for the next ~2 weeks    Prior Function Level of Independence: Independent         Comments: Pt reports independent with ADLs, community ambulation, completes yardwork, drives.     Hand Dominance   Dominant Hand: Right    Extremity/Trunk Assessment   Upper Extremity Assessment Upper Extremity Assessment: Overall WFL for  tasks assessed    Lower Extremity Assessment Lower Extremity Assessment: Generalized weakness    Cervical / Trunk Assessment Cervical / Trunk Assessment: Normal  Communication   Communication: No difficulties  Cognition Arousal/Alertness: Awake/alert Behavior During Therapy: WFL for tasks assessed/performed;Flat affect Overall Cognitive Status: Within Functional Limits for tasks assessed                                         General Comments      Exercises     Assessment/Plan    PT Assessment Patient needs continued PT services  PT Problem List Decreased activity tolerance;Decreased balance;Decreased mobility;Decreased knowledge of use of DME;Pain;Obesity       PT Treatment Interventions DME instruction;Gait training;Functional mobility training;Therapeutic activities;Therapeutic exercise;Balance training;Patient/family education    PT Goals (Current goals can be found in the Care Plan section)  Acute Rehab PT Goals Patient Stated Goal: feel better PT Goal Formulation: With patient Time For Goal Achievement: 12/02/20 Potential to Achieve Goals: Good    Frequency Min 3X/week   Barriers to discharge        Co-evaluation PT/OT/SLP Co-Evaluation/Treatment: Yes Reason for Co-Treatment: For patient/therapist safety PT goals addressed during session: Mobility/safety with mobility OT goals addressed during session: ADL's and self-care       AM-PAC PT "6 Clicks" Mobility  Outcome Measure Help needed turning from your back to your side while in a flat bed without using bedrails?: A Little Help needed moving from lying on your back to sitting on the side of a flat bed without using bedrails?: A Little Help needed moving to and from a bed to a chair (including a wheelchair)?: A Lot Help needed standing up from a chair using your arms (e.g., wheelchair or bedside chair)?: A Lot Help needed to walk in hospital room?: A Lot Help needed climbing 3-5 steps with a railing? : A Lot 6 Click Score: 14    End of Session Equipment Utilized During Treatment: Gait belt;Oxygen Activity Tolerance: Patient limited by fatigue Patient left: in chair;with call bell/phone within reach;with chair alarm set Nurse Communication: Mobility status PT Visit Diagnosis: Other abnormalities of gait and mobility (R26.89)    Time: 2563-8937 PT Time Calculation (min)  (ACUTE ONLY): 18 min   Charges:   PT Evaluation $PT Eval Low Complexity: [place] Pager [phone] Office [phone]   Claretha Cooper 11/18/2020, 10:58 AM

## 2020-11-19 DIAGNOSIS — N179 Acute kidney failure, unspecified: Secondary | ICD-10-CM | POA: Diagnosis not present

## 2020-11-19 LAB — URINE CULTURE

## 2020-11-19 LAB — CBC
HCT: 35.1 % — ABNORMAL LOW (ref 39.0–52.0)
Hemoglobin: 11.3 g/dL — ABNORMAL LOW (ref 13.0–17.0)
MCH: 32.4 pg (ref 26.0–34.0)
MCHC: 32.2 g/dL (ref 30.0–36.0)
MCV: 100.6 fL — ABNORMAL HIGH (ref 80.0–100.0)
Platelets: 340 10*3/uL (ref 150–400)
RBC: 3.49 MIL/uL — ABNORMAL LOW (ref 4.22–5.81)
RDW: 15.2 % (ref 11.5–15.5)
WBC: 13.5 10*3/uL — ABNORMAL HIGH (ref 4.0–10.5)
nRBC: 0 % (ref 0.0–0.2)

## 2020-11-19 LAB — BASIC METABOLIC PANEL
Anion gap: 11 (ref 5–15)
BUN: 80 mg/dL — ABNORMAL HIGH (ref 8–23)
CO2: 21 mmol/L — ABNORMAL LOW (ref 22–32)
Calcium: 8 mg/dL — ABNORMAL LOW (ref 8.9–10.3)
Chloride: 103 mmol/L (ref 98–111)
Creatinine, Ser: 3.46 mg/dL — ABNORMAL HIGH (ref 0.61–1.24)
GFR, Estimated: 18 mL/min — ABNORMAL LOW (ref >60)
Glucose, Bld: 103 mg/dL — ABNORMAL HIGH (ref 70–99)
Potassium: 5 mmol/L (ref 3.5–5.1)
Sodium: 135 mmol/L (ref 135–145)

## 2020-11-19 LAB — GLUCOSE, CAPILLARY
Glucose-Capillary: 104 mg/dL — ABNORMAL HIGH (ref 70–99)
Glucose-Capillary: 147 mg/dL — ABNORMAL HIGH (ref 70–99)
Glucose-Capillary: 133 mg/dL — ABNORMAL HIGH (ref 70–99)
Glucose-Capillary: 138 mg/dL — ABNORMAL HIGH (ref 70–99)

## 2020-11-19 MED ORDER — DILTIAZEM HCL 30 MG PO TABS
30.0000 mg | ORAL_TABLET | Freq: Three times a day (TID) | ORAL | Status: DC
  Administered 2020-11-19 – 2020-11-25 (×20): 30 mg via ORAL
  Filled 2020-11-19 (×20): qty 1

## 2020-11-19 NOTE — NC FL2 (Signed)
[place] LEVEL OF CARE SCREENING TOOL     IDENTIFICATION  Patient Name: Zachary Shannon. Birthdate: [birth year] Sex: male Admission Date (Current Location): 11/15/2020  [facility] and [place] Number:  Herbalist and Address:  [facility],  [place] [address] [place]      Provider Number: [id number]  Attending Physician Name and Address:  Elmarie Shiley, MD  Relative Name and Phone Number:       Current Level of Care: Hospital Recommended Level of Care: [place] Prior Approval Number:    Date Approved/Denied:   PASRR Number: [id number]  Discharge Plan: ICF    Current Diagnoses: Patient Active Problem List   Diagnosis Date Noted  . Chest pressure   . AKI (acute kidney injury) ([place]) 11/15/2020  . PAF (paroxysmal atrial fibrillation) ([place]) 11/15/2020  . Hyperkalemia 11/15/2020  . Hyperlipidemia 11/15/2020  . Bladder cancer ([place]) 10/26/2020    Orientation RESPIRATION BLADDER Height & Weight     Self,Time,Situation,Place  Normal Continent Weight: 117.8 kg Height:  6' (182.9 cm)  BEHAVIORAL SYMPTOMS/MOOD NEUROLOGICAL BOWEL NUTRITION STATUS      Continent Diet (regular)  AMBULATORY STATUS COMMUNICATION OF NEEDS Skin   Extensive Assist Verbally Normal                       Personal Care Assistance Level of Assistance  Bathing,Feeding,Dressing Bathing Assistance: Independent Feeding assistance: Independent Dressing Assistance: Independent     Functional Limitations Info  Sight,Hearing,Speech Sight Info: Adequate Hearing Info: Adequate Speech Info: Adequate    SPECIAL CARE FACTORS FREQUENCY  PT (By licensed PT),OT (By licensed OT)     PT Frequency: 5 x weekly OT Frequency: 5 x weekly            Contractures Contractures Info: Not present    Additional Factors Info  Code Status Code Status Info: full             Current Medications (11/19/2020):  This is the current  hospital active medication list Current Facility-Administered Medications  Medication Dose Route Frequency Provider Last Rate Last Admin  . acetaminophen (TYLENOL) tablet 650 mg  650 mg Oral Q6H PRN Regalado, Belkys A, MD   650 mg at 11/18/20 1245   Or  . acetaminophen (TYLENOL) suppository 650 mg  650 mg Rectal Q6H PRN Regalado, Belkys A, MD      . apixaban (ELIQUIS) tablet 5 mg  5 mg Oral BID Regalado, Belkys A, MD   5 mg at 11/18/20 2231  . atorvastatin (LIPITOR) tablet 10 mg  10 mg Oral QHS Regalado, Belkys A, MD   10 mg at 11/18/20 2231  . calcium carbonate (dosed in mg elemental calcium) suspension 500 mg of elemental calcium  500 mg of elemental calcium Oral Q6H PRN Regalado, Belkys A, MD      . camphor-menthol (SARNA) lotion 1 application  1 application Topical K9F PRN Regalado, Belkys A, MD       And  . hydrOXYzine (ATARAX/VISTARIL) tablet 25 mg  25 mg Oral Q8H PRN Regalado, Belkys A, MD      . cefTRIAXone (ROCEPHIN) 2 g in sodium chloride 0.9 % 100 mL IVPB  2 g Intravenous Q1400 Regalado, Belkys A, MD   Stopped at 11/18/20 1512  . Chlorhexidine Gluconate Cloth 2 % PADS 6 each  6 each Topical Daily Regalado, Belkys A, MD   6 each at 11/18/20 1801  . diltiazem (CARDIZEM) tablet 30  mg  30 mg Oral Q8H Regalado, Belkys A, MD   30 mg at 11/19/20 0751  . docusate sodium (COLACE) capsule 100 mg  100 mg Oral Daily Regalado, Belkys A, MD   100 mg at 11/18/20 0940  . docusate sodium (ENEMEEZ) enema 283 mg  1 enema Rectal PRN Regalado, Belkys A, MD      . hydrALAZINE (APRESOLINE) injection 10 mg  10 mg Intravenous Q4H PRN Regalado, Belkys A, MD      . HYDROmorphone (DILAUDID) injection 0.5 mg  0.5 mg Intravenous Q3H PRN Regalado, Belkys A, MD      . insulin aspart (novoLOG) injection 0-5 Units  0-5 Units Subcutaneous QHS Regalado, Belkys A, MD      . insulin aspart (novoLOG) injection 0-9 Units  0-9 Units Subcutaneous TID WC Regalado, Belkys A, MD   1 Units at 11/18/20 1648  . labetalol  (NORMODYNE) injection 10 mg  10 mg Intravenous Q4H PRN Regalado, Belkys A, MD      . lidocaine (LIDODERM) 5 % 1 patch  1 patch Transdermal Q24H Regalado, Belkys A, MD   1 patch at 11/18/20 0942  . MEDLINE mouth rinse  15 mL Mouth Rinse BID Regalado, Belkys A, MD   15 mL at 11/18/20 2232  . metoprolol tartrate (LOPRESSOR) tablet 25 mg  25 mg Oral BID Regalado, Belkys A, MD   25 mg at 11/18/20 2232  . nitroGLYCERIN (NITROSTAT) SL tablet 0.4 mg  0.4 mg Sublingual Q5 min PRN Regalado, Belkys A, MD   0.4 mg at 11/17/20 1131  . ondansetron (ZOFRAN) tablet 4 mg  4 mg Oral Q6H PRN Regalado, Belkys A, MD       Or  . ondansetron (ZOFRAN) injection 4 mg  4 mg Intravenous Q6H PRN Regalado, Belkys A, MD      . oxyCODONE-acetaminophen (PERCOCET/ROXICET) 5-325 MG per tablet 0.5-1 tablet  0.5-1 tablet Oral Q4H PRN Regalado, Belkys A, MD   1 tablet at 11/19/20 0445  . pantoprazole (PROTONIX) EC tablet 40 mg  40 mg Oral Daily Regalado, Belkys A, MD   40 mg at 11/18/20 0940  . sodium bicarbonate tablet 1,300 mg  1,300 mg Oral BID Regalado, Belkys A, MD   1,300 mg at 11/18/20 2231  . sorbitol 70 % solution 30 mL  30 mL Oral PRN Regalado, Belkys A, MD      . zolpidem (AMBIEN) tablet 5 mg  5 mg Oral QHS PRN Regalado, Belkys A, MD       Facility-Administered Medications Ordered in Other Encounters  Medication Dose Route Frequency Provider Last Rate Last Admin  . gemcitabine (GEMZAR) chemo syringe for bladder instillation 2,000 mg  2,000 mg Bladder Instillation Once Winter, Christopher Aaron, MD         Discharge Medications: Please see discharge summary for a list of discharge medications.  Relevant Imaging Results:  Relevant Lab Results:   Additional Information [id number]  Leeroy Cha, RN

## 2020-11-19 NOTE — Progress Notes (Signed)
[facility] Progress Note   Subjective:   UOP 1.9L yesterday and creatinine improved to 3.5 from 4.2 over past 24h.  No further chest pain.   Objective Vitals:   11/19/20 1200 11/19/20 1300 11/19/20 1347 11/19/20 1400  BP: 121/79 (!) 101/58 132/70 124/82  Pulse: 69 (!) 121  92  Resp: 14 18  (!) 21  Temp: 97.9 F (36.6 C)     TempSrc: Oral     SpO2: 94% 94%  96%  Weight:      Height:       Physical Exam General: obese man sleeping peacefully Heart: irreg irreg on moniotor Lungs: normal WOB, clear to bases, on RA Extremities: no edema GU: clear urine in bag  Additional Objective Labs: Basic Metabolic Panel: Recent Labs  Lab 11/17/20 0449 11/18/20 0245 11/19/20 0235  NA 134* 132* 135  K 5.3* 5.1 5.0  CL 100 101 103  CO2 23 21* 21*  GLUCOSE 110* 105* 103*  BUN 92* 90* 80*  CREATININE 4.34* 4.17* 3.46*  CALCIUM 8.1* 8.0* 8.0*  PHOS 4.5 4.5  --    Liver Function Tests: Recent Labs  Lab 11/17/20 0449 11/18/20 0245  ALBUMIN 2.5* 2.6*   No results for input(s): LIPASE, AMYLASE in the last 168 hours. CBC: Recent Labs  Lab 11/15/20 1427 11/16/20 0544 11/17/20 0449 11/18/20 0245 11/19/20 0235  WBC 16.0* 11.8* 12.1* 14.7* 13.5*  HGB 12.4* 11.0* 10.7* 11.1* 11.3*  HCT 37.7* 33.5* 33.1* 34.6* 35.1*  MCV 100.3* 99.1 98.8 99.7 100.6*  PLT 402* 350 341 345 340   Blood Culture    Component Value Date/Time   SDES  11/17/2020 1340    URINE, RANDOM Performed at [facility], [place] [address], [place], [place] [address]    SPECREQUEST  11/17/2020 1340    NONE Performed at [facility], [place] [address], [place], [place] [address]    CULT MULTIPLE SPECIES PRESENT, SUGGEST RECOLLECTION (A) 11/17/2020 1340   REPTSTATUS 11/19/2020 FINAL 11/17/2020 1340    Cardiac Enzymes: Recent Labs  Lab 11/16/20 1326  CKTOTAL 82   CBG: Recent Labs  Lab 11/18/20 1300 11/18/20 1642 11/18/20 2117 11/19/20 0745  11/19/20 1120  GLUCAP 158* 140* 112* 104* 147*   Iron Studies: No results for input(s): IRON, TIBC, TRANSFERRIN, FERRITIN in the last 72 hours. @lablastinr3 @ Studies/Results: NM Pulmonary Perfusion  Result Date: 11/17/2020 CLINICAL DATA:  70 year old male with chest pain and shortness of breath. EXAM: NUCLEAR MEDICINE PERFUSION LUNG SCAN TECHNIQUE: Perfusion images were obtained in multiple projections after intravenous injection of radiopharmaceutical. Ventilation scans intentionally deferred if perfusion scan and chest x-ray adequate for interpretation during COVID 19 epidemic. RADIOPHARMACEUTICALS:  Chest radiograph dated 11/17/2020. MCi Tc-25m MAA IV COMPARISON:  None. FINDINGS: There is slight heterogeneous uptake, likely artifactual. No focal perfusion defect noted. IMPRESSION: No scintigraphic evidence of pulmonary embolism. Electronically Signed   By: Anner Crete M.D.   On: 11/17/2020 16:54   ECHOCARDIOGRAM COMPLETE  Result Date: 11/18/2020    ECHOCARDIOGRAM REPORT   Patient Name:   Zachary Shannon. Date of Exam: 11/18/2020 Medical Rec #:  [id number]                 Height:       72.0 in Accession #:    [id number]                Weight:       258.2 lb Date of Birth:  1950-10-10  BSA:          2.375 m Patient Age:    70 years                  BP:           102/64 mmHg Patient Gender: M                         HR:           103 bpm. Exam Location:  Inpatient Procedure: 2D Echo, Color Doppler and Cardiac Doppler Indications:    Dyspnea  History:        Patient has no prior history of Echocardiogram examinations.                 Cardiomyopathy, CAD, Arrythmias:Atrial Fibrillation; Risk                 Factors:Hypertension and Dyslipidemia.  Sonographer:    Luisa Hart RDCS Referring Phys: [id number] BELKYS A REGALADO IMPRESSIONS  1. Left ventricular ejection fraction, by estimation, is 55 to 60%. The left ventricle has normal function. The left ventricle has no regional wall  motion abnormalities. Left ventricular diastolic function could not be evaluated.  2. Right ventricular systolic function is mildly reduced. The right ventricular size is mildly enlarged. There is moderately elevated pulmonary artery systolic pressure. The estimated right ventricular systolic pressure is 0000000 mmHg.  3. Left atrial size was severely dilated.  4. Right atrial size was moderately dilated.  5. The mitral valve is grossly normal. Trivial mitral valve regurgitation. No evidence of mitral stenosis.  6. The aortic valve is tricuspid. There is mild calcification of the aortic valve. There is mild thickening of the aortic valve. Aortic valve regurgitation is not visualized. No aortic stenosis is present.  7. Aortic dilatation noted. There is mild dilatation of the aortic root, measuring 42 mm. There is mild dilatation of the aortic root, measuring 43 mm.  8. The inferior vena cava is dilated in size with >50% respiratory variability, suggesting right atrial pressure of 8 mmHg. FINDINGS  Left Ventricle: Left ventricular ejection fraction, by estimation, is 55 to 60%. The left ventricle has normal function. The left ventricle has no regional wall motion abnormalities. The left ventricular internal cavity size was normal in size. There is  no left ventricular hypertrophy. Left ventricular diastolic function could not be evaluated due to atrial fibrillation. Left ventricular diastolic function could not be evaluated. Right Ventricle: The right ventricular size is mildly enlarged. No increase in right ventricular wall thickness. Right ventricular systolic function is mildly reduced. There is moderately elevated pulmonary artery systolic pressure. The tricuspid regurgitant velocity is 3.39 m/s, and with an assumed right atrial pressure of 8 mmHg, the estimated right ventricular systolic pressure is 0000000 mmHg. Left Atrium: Left atrial size was severely dilated. Right Atrium: Right atrial size was moderately dilated.  Pericardium: Trivial pericardial effusion is present. Presence of pericardial fat pad. Mitral Valve: The mitral valve is grossly normal. Mild mitral annular calcification. Trivial mitral valve regurgitation. No evidence of mitral valve stenosis. Tricuspid Valve: The tricuspid valve is grossly normal. Tricuspid valve regurgitation is mild . No evidence of tricuspid stenosis. Aortic Valve: The aortic valve is tricuspid. There is mild calcification of the aortic valve. There is mild thickening of the aortic valve. Aortic valve regurgitation is not visualized. No aortic stenosis is present. Aortic valve mean gradient measures 3.0 mmHg. Aortic valve peak gradient measures  5.6 mmHg. Aortic valve area, by VTI measures 2.83 cm. Pulmonic Valve: The pulmonic valve was grossly normal. Pulmonic valve regurgitation is not visualized. No evidence of pulmonic stenosis. Aorta: Aortic dilatation noted. There is mild dilatation of the aortic root, measuring 42 mm. There is mild dilatation of the aortic root, measuring 43 mm. Venous: The inferior vena cava is dilated in size with greater than 50% respiratory variability, suggesting right atrial pressure of 8 mmHg. IAS/Shunts: The atrial septum is grossly normal.  LEFT VENTRICLE PLAX 2D LVIDd:         5.10 cm LVIDs:         3.10 cm LV PW:         0.90 cm LV IVS:        0.90 cm LVOT diam:     2.30 cm LV SV:         54 LV SV Index:   23 LVOT Area:     4.15 cm  LV Volumes (MOD) LV vol d, MOD A2C: 77.7 ml LV vol d, MOD A4C: 113.0 ml LV vol s, MOD A2C: 29.0 ml LV vol s, MOD A4C: 66.8 ml LV SV MOD A2C:     48.7 ml LV SV MOD A4C:     113.0 ml LV SV MOD BP:      51.7 ml RIGHT VENTRICLE RV S prime:     13.10 cm/s TAPSE (M-mode): 1.5 cm LEFT ATRIUM              Index LA Vol (A2C):   187.0 ml 78.75 ml/m LA Vol (A4C):   108.0 ml 45.48 ml/m LA Biplane Vol: 150.0 ml 63.17 ml/m  AORTIC VALVE                   PULMONIC VALVE AV Area (Vmax):    2.67 cm    PV Vmax:       1.02 m/s AV Area (Vmean):    2.43 cm    PV Vmean:      75.600 cm/s AV Area (VTI):     2.83 cm    PV VTI:        0.189 m AV Vmax:           118.00 cm/s PV Peak grad:  4.2 mmHg AV Vmean:          80.400 cm/s PV Mean grad:  3.0 mmHg AV VTI:            0.191 m AV Peak Grad:      5.6 mmHg AV Mean Grad:      3.0 mmHg LVOT Vmax:         75.80 cm/s LVOT Vmean:        47.100 cm/s LVOT VTI:          0.130 m LVOT/AV VTI ratio: 0.68  AORTA Ao Root diam: 4.20 cm Ao Asc diam:  4.30 cm MITRAL VALVE               TRICUSPID VALVE MV Area (PHT): 7.59 cm    TR Peak grad:   46.0 mmHg MV Decel Time: 100 msec    TR Vmax:        339.00 cm/s MV E velocity: 92.75 cm/s                            SHUNTS  Systemic VTI:  0.13 m                            Systemic Diam: 2.30 cm Eleonore Chiquito MD Electronically signed by Eleonore Chiquito MD Signature Date/Time: 11/18/2020/11:13:56 AM    Final    Medications: . cefTRIAXone (ROCEPHIN)  IV 2 g (11/19/20 1350)   . apixaban  5 mg Oral BID  . atorvastatin  10 mg Oral QHS  . Chlorhexidine Gluconate Cloth  6 each Topical Daily  . diltiazem  30 mg Oral Q8H  . docusate sodium  100 mg Oral Daily  . insulin aspart  0-5 Units Subcutaneous QHS  . insulin aspart  0-9 Units Subcutaneous TID WC  . lidocaine  1 patch Transdermal Q24H  . mouth rinse  15 mL Mouth Rinse BID  . metoprolol tartrate  25 mg Oral BID  . pantoprazole  40 mg Oral Daily  . sodium bicarbonate  1,300 mg Oral BID    Assessment 89M AKI, hyperkalemia, NAG metabolic Acidosis in setting of cystoprostatectomy 10/27/20, progressive anorexia/hypovolemia, ongoing use of ACE inhibitor.  Most likely this is a combination of hypovolemia and ongoing use of enalapril.  Imaging is without obstruction with intact ureteral stents but urology consulted to make sure that no other concerns are identified.  1. AKI, baseline creatinine 1.0; likely hypovolemia+ACEi; nonoliguric and improving slowly 2. Bladder cancer status post cystoprostatectomy  10/27/2020 with ileal conduit 3. Recent hematuria into ileal conduit, urine blood tinged today 4. A. fib on apixaban Hyperkalemia, moderate, no EKG changes, status post medical management and resolved 5. NAG metabolic acidosis likely related to #1 and #2, resolved with bicarb supplementation 6. Mild hyponatremia, resolved 7. History of systolic heart failure and CAD 8. Chest pain, not thought to be cardiac in nature; cardiology c/s and s/o.  Plan 1. Remain off IVF.  On 2L fluid restriction which I think is fine. 2. No indication for diuretic at the moment 3. Do not resume ACE inhibitor at this time 4. Urology following 5. Avoid hypotonic fluids 6. Avoid hypotension - on diltiazem and MTP for A fib  7. Daily weights, Daily Renal Panel, Strict I/Os, Avoid nephrotoxins (NSAIDs, judicious IV Contrast)   I don't have any further recs at this time -will sign off given resolving AKI and normal baseline renal function which I expect he will return to.  His ACEi can be resumed in the outpt setting by PCP if renal function normalizes.  If not we'd be happy to see him in clinic.   Jannifer Hick MD 11/19/2020, 2:23 PM  [facility] Pager: [phone]

## 2020-11-19 NOTE — TOC Initial Note (Signed)
Transition of Care (TOC) - Initial/Assessment Note    Patient Details  Name: Zachary Shannon. MRN: [id number] Date of Birth: 05-12-1951  Transition of Care [facility]) CM/SW Contact:    Leeroy Cha, RN Phone Number: 11/19/2020, 8:21 AM  Clinical Narrative:                 70 year old with past medical history significant of bladder tumor status post recent cystoprostatectomy on April 6 with placement of ileal conduit and lateral stents.  Patient presented to the ER complaining of flank and suprapubic pain and confusion.  Patient was found to have a creatinine more than 6.  His creatinine was 1.1 when he left the hospital post surgery.  He has a history of paroxysmal A. fib, chronic systolic heart failure, CAD and hyperlipidemia he is currently taking Eliquis.  Family noticed progressive weakness.  Denies taking NSAID but has been taking 80s.  Patient admitted with acute renal failure, CT renal stone showed no acute findings. PLAN: snf placement  PASSAR#=[phone] A YPP:[id number] fl2 completed but not sent out to area nursing home due to condition and not ready for dc.  Expected Discharge Plan: Skilled Nursing Facility Barriers to Discharge: Continued Medical Work up   Patient Goals and CMS Choice Patient states their goals for this hospitalization and ongoing recovery are:: to go home CMS Medicare.gov Compare Post Acute Care list provided to:: Patient    Expected Discharge Plan and Services Expected Discharge Plan: [place]   Discharge Planning Services: CM Consult Post Acute Care Choice: [place] Living arrangements for the past 2 months: Single Family Home                                      Prior Living Arrangements/Services Living arrangements for the past 2 months: Single Family Home Lives with:: Self Patient language and need for interpreter reviewed:: Yes        Need for Family Participation in Patient Care: No  (Comment) Care giver support system in place?: No (comment)   Criminal Activity/Legal Involvement Pertinent to Current Situation/Hospitalization: No - Comment as needed  Activities of Daily Living Home Assistive Devices/Equipment: Eyeglasses,Cane (specify quad or straight),Walker (specify type) ADL Screening (condition at time of admission) Patient's cognitive ability adequate to safely complete daily activities?: Yes Is the patient deaf or have difficulty hearing?: No Does the patient have difficulty seeing, even when wearing glasses/contacts?: No Does the patient have difficulty concentrating, remembering, or making decisions?: No Patient able to express need for assistance with ADLs?: Yes Does the patient have difficulty dressing or bathing?: Yes Independently performs ADLs?: No Communication: Independent Dressing (OT): Needs assistance Is this a change from baseline?: Change from baseline, expected to last >3 days Grooming: Independent Feeding: Independent Bathing: Needs assistance Is this a change from baseline?: Change from baseline, expected to last >3 days In/Out Bed: Needs assistance Is this a change from baseline?: Change from baseline, expected to last >3 days Walks in Home: Needs assistance Is this a change from baseline?: Change from baseline, expected to last >3 days Does the patient have difficulty walking or climbing stairs?: Yes Weakness of Legs: Both Weakness of Arms/Hands: Both  Permission Sought/Granted                  Emotional Assessment Appearance:: Appears stated age Attitude/Demeanor/Rapport: Engaged Affect (typically observed): Calm Orientation: : Oriented to Place,Oriented to Self,Oriented  to  Time,Oriented to Situation Alcohol / Substance Use: Not Applicable Psych Involvement: No (comment)  Admission diagnosis:  AKI (acute kidney injury) ([place]) [N17.9] Acute renal failure, unspecified acute renal failure type [facility]) [N17.9] Patient Active  Problem List   Diagnosis Date Noted  . Chest pressure   . AKI (acute kidney injury) ([place]) 11/15/2020  . PAF (paroxysmal atrial fibrillation) ([place]) 11/15/2020  . Hyperkalemia 11/15/2020  . Hyperlipidemia 11/15/2020  . Bladder cancer ([place]) 10/26/2020   PCP:  Minna Antis, DO Pharmacy:   [id number] - [place], [place] [address] Phone: [phone] Fax: [phone]     Social Determinants of Health (SDOH) Interventions    Readmission Risk Interventions No flowsheet data found.

## 2020-11-19 NOTE — TOC Progression Note (Addendum)
Transition of Care (TOC) - Progression Note    Patient Details  Name: Zachary Shannon. MRN: [id number] Date of Birth: [birth year]  Transition of Care [facility]) CM/SW Contact  Leeroy Cha, RN Phone Number: 11/19/2020, 2:43 PM  Clinical Narrative:    tcf-sister Eustaquio Maize the family does want the patient to go to rehab before coming back home.  He lives alone and they can not provide care 24/7. She has no preference as to snf .  The family will e switching care to the [place] area.  They do not the snf to be in [place].  Once he is in a snf [facility] the prferred [facility]. fl2 sent out to area snf with possible Monday dc. Patient has had both covid vaccines and a booster per the sister. Expected Discharge Plan: [place] Barriers to Discharge: Continued Medical Work up  Expected Discharge Plan and Services Expected Discharge Plan: [place]   Discharge Planning Services: CM Consult Post Acute Care Choice: [place] Living arrangements for the past 2 months: Single Family Home                                       Social Determinants of Health (SDOH) Interventions    Readmission Risk Interventions No flowsheet data found.

## 2020-11-19 NOTE — Progress Notes (Signed)
PROGRESS NOTE    Zachary Shannon.  [id number] DOB: 1950/08/03 DOA: 11/15/2020 PCP: Minna Antis, DO   Brief Narrative: 70 year old with past medical history significant of bladder tumor status post recent cystoprostatectomy on April 6 with placement of ileal conduit and lateral stents.  Patient presented to the ER complaining of flank and suprapubic pain and confusion.  Patient was found to have a creatinine more than 6.  His creatinine was 1.1 when he left the hospital post surgery.  He has a history of paroxysmal A. fib, chronic systolic heart failure, CAD and hyperlipidemia he is currently taking Eliquis.  Family noticed progressive weakness.  Denies taking NSAID but has been taking 80s.  Patient admitted with acute renal failure, CT renal stone showed no acute findings. Treated with IV fluids, IV antibiotics to cover for Pyelo. Renal function improving.  He develops Chest pain work up negative for MI and PE.        Assessment & Plan:   Principal Problem:   AKI (acute kidney injury) ([place]) Active Problems:   Bladder cancer ([place])   PAF (paroxysmal atrial fibrillation) ([facility])   Hyperkalemia   Hyperlipidemia   Chest pressure   1-AKI; creatinine baseline 1.0 Could be related to hypovolemia and ACE use, Pyelo.  Treated with  bicarb drip. Nephrology and urology following.  CK level normal.  Plan to treat for pyelo, due to back pain, leukocytosis. Monitor WBC Cr stable, down to 3.4 Off fluids, renal function improving. Continue to monitor.   2-Chest Pressure;  He complained of chest pressure, pain 4/27. Fluids has been discontinue by nephrologist.  Patient is on Eliquis, less likely PE, But VQ scan ordered, negative for PE>  Troponin flat, not significantly elevated. ECHO; normal Ef, No wall motion abnormalities.  Cardiology sign off.  Continue with PPI>   3-Back pain: He complains of back pain shoulder blade  area mid back initially started on the left and  now is on the right. -MRI showed mild disc protrussion. Discussed with Dr Arneta Cliche who review MRI, he doesn't see a significant disc protrusion,  unlikely that will explain patient's pain.  -Continue with oxycodone as needed. -Continue Lidocaine patch, which has been helping.  -VQ scan negative for PE>   4-A fib; Continue with Eliquis, metoprolol. Cardizem initially on  hold due to hypotension 4/27.  HR elevated today, plan to resume Cardizem Lower dose.. BP elevated as well.   5-Hyperkalemia;  -Received Lokelma and Bicarb change to oral.   6-Mild hyponatremia: Monitor.   7-Bladder cancer status post recent cystoprostatectomy and ileal conduit and lateral stent placement October 27, 2020. -Appreciate Dr Tresa Moore evaluation.  -plan to treat for pyelo.    8-Leukocytosis; reactive , improved. Plan to treat for pyelo Monitor.  Trending down.   Hypotension; transient. Responded to fluids.  Monitor step down.  Plan to transfer to telemetry if he tolerates cardizem.   Confusion; reported on admission, transient. Unspecific.   Estimated body mass index is 35.22 kg/m as calculated from the following:   Height as of this encounter: 6' (1.829 m).   Weight as of this encounter: 117.8 kg.   DVT prophylaxis: Eliquis Code Status: Full code Family Communication: Daughter at bedside 4/28 Disposition Plan:  Status is: Inpatient  Remains inpatient appropriate because:IV treatments appropriate due to intensity of illness or inability to take PO   Dispo: The patient is from: Home              Anticipated d/c is  to: Home              Patient currently is not medically stable to d/c. Patient will benefit from SNF for rehab.    Difficult to place patient No        Consultants:   Urology  Nephrology   Procedures:     Antimicrobials:    Subjective: He is feeling better, denies dyspnea, chest pain. Back pain is better with lidocaine patch.  He is happy to hear kidney function  continue to improve.   Objective: Vitals:   11/19/20 0500 11/19/20 0751 11/19/20 0800 11/19/20 0933  BP: (!) 138/92 136/84  (!) 148/85  Pulse: (!) 103   (!) 114  Resp: 17     Temp:   97.9 F (36.6 C)   TempSrc:   Oral   SpO2: 91%     Weight:      Height:        Intake/Output Summary (Last 24 hours) at 11/19/2020 1030 Last data filed at 11/19/2020 0440 Gross per 24 hour  Intake 790 ml  Output 1925 ml  Net -1135 ml   Filed Weights   11/17/20 1546 11/18/20 0500 11/19/20 0447  Weight: 117.9 kg 117.1 kg 117.8 kg    Examination:  General exam: NAD Respiratory system: CTA Cardiovascular system: S 1, S 2 RRR Gastrointestinal system: BS present, soft, nt Central nervous system: alert, conversant Extremities: no edema   Data Reviewed: I have personally reviewed following labs and imaging studies  CBC: Recent Labs  Lab 11/15/20 1427 11/16/20 0544 11/17/20 0449 11/18/20 0245 11/19/20 0235  WBC 16.0* 11.8* 12.1* 14.7* 13.5*  HGB 12.4* 11.0* 10.7* 11.1* 11.3*  HCT 37.7* 33.5* 33.1* 34.6* 35.1*  MCV 100.3* 99.1 98.8 99.7 100.6*  PLT 402* 350 341 345 818   Basic Metabolic Panel: Recent Labs  Lab 11/16/20 0544 11/16/20 1326 11/17/20 0449 11/18/20 0245 11/19/20 0235  NA 128* 129* 134* 132* 135  K 5.7* 5.3* 5.3* 5.1 5.0  CL 102 100 100 101 103  CO2 19* 19* 23 21* 21*  GLUCOSE 122* 100* 110* 105* 103*  BUN 94* 90* 92* 90* 80*  CREATININE 5.88*  5.82* 5.12* 4.34* 4.17* 3.46*  CALCIUM 7.9* 7.9* 8.1* 8.0* 8.0*  PHOS  --   --  4.5 4.5  --    GFR: Estimated Creatinine Clearance: 26.3 mL/min (A) (by C-G formula based on SCr of 3.46 mg/dL (H)). Liver Function Tests: Recent Labs  Lab 11/17/20 0449 11/18/20 0245  ALBUMIN 2.5* 2.6*   No results for input(s): LIPASE, AMYLASE in the last 168 hours. No results for input(s): AMMONIA in the last 168 hours. Coagulation Profile: No results for input(s): INR, PROTIME in the last 168 hours. Cardiac Enzymes: Recent Labs   Lab 11/16/20 1326  CKTOTAL 82   BNP (last 3 results) No results for input(s): PROBNP in the last 8760 hours. HbA1C: No results for input(s): HGBA1C in the last 72 hours. CBG: Recent Labs  Lab 11/18/20 0730 11/18/20 1300 11/18/20 1642 11/18/20 2117 11/19/20 0745  GLUCAP 83 158* 140* 112* 104*   Lipid Profile: No results for input(s): CHOL, HDL, LDLCALC, TRIG, CHOLHDL, LDLDIRECT in the last 72 hours. Thyroid Function Tests: No results for input(s): TSH, T4TOTAL, FREET4, T3FREE, THYROIDAB in the last 72 hours. Anemia Panel: No results for input(s): VITAMINB12, FOLATE, FERRITIN, TIBC, IRON, RETICCTPCT in the last 72 hours. Sepsis Labs: No results for input(s): PROCALCITON, LATICACIDVEN in the last 168 hours.  Recent Results (from the  past 240 hour(s))  Resp Panel by RT-PCR (Flu A&B, Covid) Nasopharyngeal Swab     Status: None   Collection Time: 11/15/20  6:33 PM   Specimen: Nasopharyngeal Swab; Nasopharyngeal(NP) swabs in vial transport medium  Result Value Ref Range Status   SARS Coronavirus 2 by RT PCR NEGATIVE NEGATIVE Final    Comment: (NOTE) SARS-CoV-2 target nucleic acids are NOT DETECTED.  The SARS-CoV-2 RNA is generally detectable in upper respiratory specimens during the acute phase of infection. The lowest concentration of SARS-CoV-2 viral copies this assay can detect is 138 copies/mL. A negative result does not preclude SARS-Cov-2 infection and should not be used as the sole basis for treatment or other patient management decisions. A negative result may occur with  improper specimen collection/handling, submission of specimen other than nasopharyngeal swab, presence of viral mutation(s) within the areas targeted by this assay, and inadequate number of viral copies(<138 copies/mL). A negative result must be combined with clinical observations, patient history, and epidemiological information. The expected result is Negative.  Fact Sheet for Patients:   EntrepreneurPulse.com.au  Fact Sheet for Healthcare Providers:  IncredibleEmployment.be  This test is no t yet approved or cleared by the [place] FDA and  has been authorized for detection and/or diagnosis of SARS-CoV-2 by FDA under an Emergency Use Authorization (EUA). This EUA will remain  in effect (meaning this test can be used) for the duration of the COVID-19 declaration under Section 564(b)(1) of the Act, 21 U.S.C.section 360bbb-3(b)(1), unless the authorization is terminated  or revoked sooner.       Influenza A by PCR NEGATIVE NEGATIVE Final   Influenza B by PCR NEGATIVE NEGATIVE Final    Comment: (NOTE) The Xpert Xpress SARS-CoV-2/FLU/RSV plus assay is intended as an aid in the diagnosis of influenza from Nasopharyngeal swab specimens and should not be used as a sole basis for treatment. Nasal washings and aspirates are unacceptable for Xpert Xpress SARS-CoV-2/FLU/RSV testing.  Fact Sheet for Patients: EntrepreneurPulse.com.au  Fact Sheet for Healthcare Providers: IncredibleEmployment.be  This test is not yet approved or cleared by the [place] FDA and has been authorized for detection and/or diagnosis of SARS-CoV-2 by FDA under an Emergency Use Authorization (EUA). This EUA will remain in effect (meaning this test can be used) for the duration of the COVID-19 declaration under Section 564(b)(1) of the Act, 21 U.S.C. section 360bbb-3(b)(1), unless the authorization is terminated or revoked.  Performed at [facility], [place] [address] [place], [place] [address]   Culture, blood (routine x 2)     Status: None (Preliminary result)   Collection Time: 11/17/20 12:47 PM   Specimen: BLOOD  Result Value Ref Range Status   Specimen Description   Final    BLOOD RIGHT ANTECUBITAL Performed at [facility] [address], [place], [place] [address]     Special Requests   Final    BOTTLES DRAWN AEROBIC AND ANAEROBIC Blood Culture adequate volume Performed at [facility] [address] [place], [place] [address]    Culture   Final    NO GROWTH 2 DAYS Performed at [facility] [address], [place], [place] [address]    Report Status PENDING  Incomplete  Culture, Urine     Status: Abnormal   Collection Time: 11/17/20  1:40 PM   Specimen: Urine, Random  Result Value Ref Range Status   Specimen Description   Final    URINE, RANDOM Performed at [facility], [place] [address], [place], [place] [address]    Special Requests   Final    NONE Performed at [facility], [place] [address], [place], [place] [address]    Culture MULTIPLE SPECIES PRESENT, SUGGEST RECOLLECTION (A)  Final   Report Status 11/19/2020 FINAL  Final  MRSA PCR Screening     Status: None   Collection Time: 11/17/20  3:49 PM   Specimen: Nasal Mucosa; Nasopharyngeal  Result Value Ref Range Status   MRSA by PCR NEGATIVE NEGATIVE Final    Comment:        The GeneXpert MRSA Assay (FDA approved for NASAL specimens only), is one component of a comprehensive MRSA colonization surveillance program. It is not intended to diagnose MRSA infection nor to guide or monitor treatment for MRSA infections. Performed at [facility], [place] [address], [place], [place] [address]          Radiology Studies: NM Pulmonary Perfusion  Result Date: 11/17/2020 CLINICAL DATA:  70 year old male with chest pain and shortness of breath. EXAM: NUCLEAR MEDICINE PERFUSION LUNG SCAN TECHNIQUE: Perfusion images were obtained in multiple projections after intravenous injection of radiopharmaceutical. Ventilation scans intentionally deferred if perfusion scan and chest x-ray adequate for interpretation during COVID 19 epidemic. RADIOPHARMACEUTICALS:  Chest radiograph dated 11/17/2020. MCi Tc-58m MAA IV  COMPARISON:  None. FINDINGS: There is slight heterogeneous uptake, likely artifactual. No focal perfusion defect noted. IMPRESSION: No scintigraphic evidence of pulmonary embolism. Electronically Signed   By: Anner Crete M.D.   On: 11/17/2020 16:54   DG CHEST PORT 1 VIEW  Result Date: 11/17/2020 CLINICAL DATA:  Chest pain EXAM: PORTABLE CHEST 1 VIEW COMPARISON:  November 15, 2020. FINDINGS: Lungs are clear. Heart is mildly enlarged with pulmonary vascularity within normal limits. No adenopathy. There is aortic atherosclerosis. No bone lesions. IMPRESSION: Mild cardiomegaly. No edema or airspace opacity. Aortic Atherosclerosis (ICD10-I70.0). Electronically Signed   By: Lowella Grip III M.D.   On: 11/17/2020 12:03   ECHOCARDIOGRAM COMPLETE  Result Date: 11/18/2020    ECHOCARDIOGRAM REPORT   Patient Name:   Zachary Shannon. Date of Exam: 11/18/2020 Medical Rec #:  [id number]                 Height:       72.0 in Accession #:    [id number]                Weight:       258.2 lb Date of Birth:  1951/07/19                 BSA:          2.375 m Patient Age:    54 years                  BP:           102/64 mmHg Patient Gender: M                         HR:           103 bpm. Exam Location:  Inpatient Procedure: 2D Echo, Color Doppler and Cardiac Doppler Indications:    Dyspnea  History:        Patient has no prior history of Echocardiogram examinations.                 Cardiomyopathy, CAD, Arrythmias:Atrial Fibrillation; Risk  Factors:Hypertension and Dyslipidemia.  Sonographer:    Luisa Hart RDCS Referring Phys: [id number] Kamari Buch A Elliyah Liszewski IMPRESSIONS  1. Left ventricular ejection fraction, by estimation, is 55 to 60%. The left ventricle has normal function. The left ventricle has no regional wall motion abnormalities. Left ventricular diastolic function could not be evaluated.  2. Right ventricular systolic function is mildly reduced. The right ventricular size is mildly enlarged. There  is moderately elevated pulmonary artery systolic pressure. The estimated right ventricular systolic pressure is 56.2 mmHg.  3. Left atrial size was severely dilated.  4. Right atrial size was moderately dilated.  5. The mitral valve is grossly normal. Trivial mitral valve regurgitation. No evidence of mitral stenosis.  6. The aortic valve is tricuspid. There is mild calcification of the aortic valve. There is mild thickening of the aortic valve. Aortic valve regurgitation is not visualized. No aortic stenosis is present.  7. Aortic dilatation noted. There is mild dilatation of the aortic root, measuring 42 mm. There is mild dilatation of the aortic root, measuring 43 mm.  8. The inferior vena cava is dilated in size with >50% respiratory variability, suggesting right atrial pressure of 8 mmHg. FINDINGS  Left Ventricle: Left ventricular ejection fraction, by estimation, is 55 to 60%. The left ventricle has normal function. The left ventricle has no regional wall motion abnormalities. The left ventricular internal cavity size was normal in size. There is  no left ventricular hypertrophy. Left ventricular diastolic function could not be evaluated due to atrial fibrillation. Left ventricular diastolic function could not be evaluated. Right Ventricle: The right ventricular size is mildly enlarged. No increase in right ventricular wall thickness. Right ventricular systolic function is mildly reduced. There is moderately elevated pulmonary artery systolic pressure. The tricuspid regurgitant velocity is 3.39 m/s, and with an assumed right atrial pressure of 8 mmHg, the estimated right ventricular systolic pressure is 13.0 mmHg. Left Atrium: Left atrial size was severely dilated. Right Atrium: Right atrial size was moderately dilated. Pericardium: Trivial pericardial effusion is present. Presence of pericardial fat pad. Mitral Valve: The mitral valve is grossly normal. Mild mitral annular calcification. Trivial mitral valve  regurgitation. No evidence of mitral valve stenosis. Tricuspid Valve: The tricuspid valve is grossly normal. Tricuspid valve regurgitation is mild . No evidence of tricuspid stenosis. Aortic Valve: The aortic valve is tricuspid. There is mild calcification of the aortic valve. There is mild thickening of the aortic valve. Aortic valve regurgitation is not visualized. No aortic stenosis is present. Aortic valve mean gradient measures 3.0 mmHg. Aortic valve peak gradient measures 5.6 mmHg. Aortic valve area, by VTI measures 2.83 cm. Pulmonic Valve: The pulmonic valve was grossly normal. Pulmonic valve regurgitation is not visualized. No evidence of pulmonic stenosis. Aorta: Aortic dilatation noted. There is mild dilatation of the aortic root, measuring 42 mm. There is mild dilatation of the aortic root, measuring 43 mm. Venous: The inferior vena cava is dilated in size with greater than 50% respiratory variability, suggesting right atrial pressure of 8 mmHg. IAS/Shunts: The atrial septum is grossly normal.  LEFT VENTRICLE PLAX 2D LVIDd:         5.10 cm LVIDs:         3.10 cm LV PW:         0.90 cm LV IVS:        0.90 cm LVOT diam:     2.30 cm LV SV:         54 LV SV Index:   23 LVOT  Area:     4.15 cm  LV Volumes (MOD) LV vol d, MOD A2C: 77.7 ml LV vol d, MOD A4C: 113.0 ml LV vol s, MOD A2C: 29.0 ml LV vol s, MOD A4C: 66.8 ml LV SV MOD A2C:     48.7 ml LV SV MOD A4C:     113.0 ml LV SV MOD BP:      51.7 ml RIGHT VENTRICLE RV S prime:     13.10 cm/s TAPSE (M-mode): 1.5 cm LEFT ATRIUM              Index LA Vol (A2C):   187.0 ml 78.75 ml/m LA Vol (A4C):   108.0 ml 45.48 ml/m LA Biplane Vol: 150.0 ml 63.17 ml/m  AORTIC VALVE                   PULMONIC VALVE AV Area (Vmax):    2.67 cm    PV Vmax:       1.02 m/s AV Area (Vmean):   2.43 cm    PV Vmean:      75.600 cm/s AV Area (VTI):     2.83 cm    PV VTI:        0.189 m AV Vmax:           118.00 cm/s PV Peak grad:  4.2 mmHg AV Vmean:          80.400 cm/s PV Mean  grad:  3.0 mmHg AV VTI:            0.191 m AV Peak Grad:      5.6 mmHg AV Mean Grad:      3.0 mmHg LVOT Vmax:         75.80 cm/s LVOT Vmean:        47.100 cm/s LVOT VTI:          0.130 m LVOT/AV VTI ratio: 0.68  AORTA Ao Root diam: 4.20 cm Ao Asc diam:  4.30 cm MITRAL VALVE               TRICUSPID VALVE MV Area (PHT): 7.59 cm    TR Peak grad:   46.0 mmHg MV Decel Time: 100 msec    TR Vmax:        339.00 cm/s MV E velocity: 92.75 cm/s                            SHUNTS                            Systemic VTI:  0.13 m                            Systemic Diam: 2.30 cm Eleonore Chiquito MD Electronically signed by Eleonore Chiquito MD Signature Date/Time: 11/18/2020/11:13:56 AM    Final         Scheduled Meds: . apixaban  5 mg Oral BID  . atorvastatin  10 mg Oral QHS  . Chlorhexidine Gluconate Cloth  6 each Topical Daily  . diltiazem  30 mg Oral Q8H  . docusate sodium  100 mg Oral Daily  . insulin aspart  0-5 Units Subcutaneous QHS  . insulin aspart  0-9 Units Subcutaneous TID WC  . lidocaine  1 patch Transdermal Q24H  . mouth rinse  15 mL Mouth Rinse BID  . metoprolol tartrate  25  mg Oral BID  . pantoprazole  40 mg Oral Daily  . sodium bicarbonate  1,300 mg Oral BID   Continuous Infusions: . cefTRIAXone (ROCEPHIN)  IV Stopped (11/18/20 1512)     LOS: 4 days    Time spent: 35 minutes    Heraclio Seidman A Leshae Mcclay, MD [facility]   If 7PM-7AM, please contact night-coverage [email or web address]  11/19/2020, 10:30 AM

## 2020-11-20 DIAGNOSIS — N179 Acute kidney failure, unspecified: Secondary | ICD-10-CM | POA: Diagnosis not present

## 2020-11-20 LAB — GLUCOSE, CAPILLARY
Glucose-Capillary: 115 mg/dL — ABNORMAL HIGH (ref 70–99)
Glucose-Capillary: 98 mg/dL (ref 70–99)
Glucose-Capillary: 108 mg/dL — ABNORMAL HIGH (ref 70–99)
Glucose-Capillary: 109 mg/dL — ABNORMAL HIGH (ref 70–99)

## 2020-11-20 LAB — CBC
HCT: 35.3 % — ABNORMAL LOW (ref 39.0–52.0)
Hemoglobin: 11.1 g/dL — ABNORMAL LOW (ref 13.0–17.0)
MCH: 32.2 pg (ref 26.0–34.0)
MCHC: 31.4 g/dL (ref 30.0–36.0)
MCV: 102.3 fL — ABNORMAL HIGH (ref 80.0–100.0)
Platelets: 349 10*3/uL (ref 150–400)
RBC: 3.45 MIL/uL — ABNORMAL LOW (ref 4.22–5.81)
RDW: 15.3 % (ref 11.5–15.5)
WBC: 13.5 10*3/uL — ABNORMAL HIGH (ref 4.0–10.5)
nRBC: 0 % (ref 0.0–0.2)

## 2020-11-20 LAB — BASIC METABOLIC PANEL
Anion gap: 10 (ref 5–15)
BUN: 73 mg/dL — ABNORMAL HIGH (ref 8–23)
CO2: 23 mmol/L (ref 22–32)
Calcium: 8.2 mg/dL — ABNORMAL LOW (ref 8.9–10.3)
Chloride: 105 mmol/L (ref 98–111)
Creatinine, Ser: 3.11 mg/dL — ABNORMAL HIGH (ref 0.61–1.24)
GFR, Estimated: 21 mL/min — ABNORMAL LOW (ref >60)
Glucose, Bld: 105 mg/dL — ABNORMAL HIGH (ref 70–99)
Potassium: 4.9 mmol/L (ref 3.5–5.1)
Sodium: 138 mmol/L (ref 135–145)

## 2020-11-20 NOTE — Progress Notes (Signed)
PROGRESS NOTE    Zachary Shannon.  NWG:[id number] DOB: Jun 16, 1951 DOA: 11/15/2020 PCP: Minna Antis, DO   Brief Narrative: 70 year old with past medical history significant of bladder tumor status post recent cystoprostatectomy on April 6 with placement of ileal conduit and lateral stents.  Patient presented to the ER complaining of flank and suprapubic pain and confusion.  Patient was found to have a creatinine more than 6.  His creatinine was 1.1 when he left the hospital post surgery.  He has a history of paroxysmal A. fib, chronic systolic heart failure, CAD and hyperlipidemia he is currently taking Eliquis.  Family noticed progressive weakness.  Denies taking NSAID but has been taking 80s.  Patient admitted with acute renal failure, CT renal stone showed no acute findings. Treated with IV fluids, IV antibiotics to cover for Pyelo. Renal function improving.  He develops Chest pain work up negative for MI and PE.   11/20/2020: Available records reviewed.  Patient seen.  No new complaints.  Serum creatinine continues to improve.  Input from nephrology and cardiology team is highly appreciated.  Vitals are stable.  We will transfer patient to telemetry floor.   Assessment & Plan:   Principal Problem:   AKI (acute kidney injury) ([place]) Active Problems:   Bladder cancer ([place])   PAF (paroxysmal atrial fibrillation) ([facility])   Hyperkalemia   Hyperlipidemia   Chest pressure   1-AKI; creatinine baseline 1.0 Could be related to hypovolemia and ACE use, Pyelo.  Treated with  bicarb drip. Nephrology and urology following.  CK level normal.  Plan to treat for pyelo, due to back pain, leukocytosis. Monitor WBC Cr stable, down to 3.4 Off fluids, renal function improving. Continue to monitor. 11/20/2020: Serum creatinine is down to 3.11.  CO2 is 23.  Continue to monitor renal function.  2-Chest Pressure;  He complained of chest pressure, pain 4/27. Fluids has been discontinue by  nephrologist.  Patient is on Eliquis, less likely PE, But VQ scan ordered, negative for PE>  Troponin flat, not significantly elevated. ECHO; normal Ef, No wall motion abnormalities.  Cardiology sign off.  Continue with PPI>   3-Back pain: He complains of back pain shoulder blade  area mid back initially started on the left and now is on the right. -MRI showed mild disc protrussion. Discussed with Dr Arneta Cliche who review MRI, he doesn't see a significant disc protrusion,  unlikely that will explain patient's pain.  -Continue with oxycodone as needed. -Continue Lidocaine patch, which has been helping.  -VQ scan negative for PE>   4-A fib; Continue with Eliquis, metoprolol. Cardizem initially on  hold due to hypotension 4/27.  HR elevated today, plan to resume Cardizem Lower dose.. BP elevated as well. 11/20/2020: Heart rate is controlled.  Blood pressure is stable.  5-Hyperkalemia;  -Received Lokelma and Bicarb change to oral. 11/20/2020: Potassium today is 4.9.  6-Mild hyponatremia: Monitor. 11/20/2020: Resolved.  Sodium is 138 today.  7-Bladder cancer status post recent cystoprostatectomy and ileal conduit and lateral stent placement October 27, 2020. -Appreciate Dr Tresa Moore evaluation.  -plan to treat for pyelo.    8-Leukocytosis; reactive , improved. Plan to treat for pyelo Monitor.  Trending down.   Hypotension; transient. Responded to fluids.  Monitor step down.  Plan to transfer to telemetry if he tolerates cardizem.   Confusion; reported on admission, transient. Unspecific.   Estimated body mass index is 35.22 kg/m as calculated from the following:   Height as of this encounter: 6' (1.829 m).  Weight as of this encounter: 117.8 kg.   DVT prophylaxis: Eliquis Code Status: Full code Family Communication: Daughter at bedside 4/28 Disposition Plan:  Status is: Inpatient  Remains inpatient appropriate because:IV treatments appropriate due to intensity of illness or  inability to take PO   Dispo: The patient is from: Home              Anticipated d/c is to: Home              Patient currently is not medically stable to d/c. Patient will benefit from SNF for rehab.    Difficult to place patient No        Consultants:   Urology  Nephrology   Procedures:     Antimicrobials:  IV ceftriaxone started on 11/17/2020.    Subjective: No new complaints. Patient is not particularly chatty. No shortness of breath. No chest pain.    Objective: Vitals:   11/20/20 0500 11/20/20 0600 11/20/20 0800 11/20/20 0809  BP: 121/83 116/69  113/71  Pulse: 94 93  87  Resp: 12 16    Temp:   98.3 F (36.8 C)   TempSrc:   Oral   SpO2: 90% 91%    Weight:      Height:        Intake/Output Summary (Last 24 hours) at 11/20/2020 0852 Last data filed at 11/20/2020 0816 Gross per 24 hour  Intake 1720 ml  Output 1850 ml  Net -130 ml   Filed Weights   11/17/20 1546 11/18/20 0500 11/19/20 0447  Weight: 117.9 kg 117.1 kg 117.8 kg    Examination:  General exam: Patient is moderately obese.  Patient is not in any distress.  Patient is awake and alert.  Patient is also edematous.   Respiratory system: Clear to auscultation.   Cardiovascular system: S1-S2.  Gastrointestinal system: Obese, soft and nontender.  Patient has right-sided colostomy bag.  Central nervous system: Awake and alert.  Patient moves all extremities.  Extremities: Patient has edema of the extremities.    Data Reviewed: I have personally reviewed following labs and imaging studies  CBC: Recent Labs  Lab 11/16/20 0544 11/17/20 0449 11/18/20 0245 11/19/20 0235 11/20/20 0239  WBC 11.8* 12.1* 14.7* 13.5* 13.5*  HGB 11.0* 10.7* 11.1* 11.3* 11.1*  HCT 33.5* 33.1* 34.6* 35.1* 35.3*  MCV 99.1 98.8 99.7 100.6* 102.3*  PLT 350 341 345 340 440   Basic Metabolic Panel: Recent Labs  Lab 11/16/20 1326 11/17/20 0449 11/18/20 0245 11/19/20 0235 11/20/20 0239  NA 129* 134* 132* 135 138   K 5.3* 5.3* 5.1 5.0 4.9  CL 100 100 101 103 105  CO2 19* 23 21* 21* 23  GLUCOSE 100* 110* 105* 103* 105*  BUN 90* 92* 90* 80* 73*  CREATININE 5.12* 4.34* 4.17* 3.46* 3.11*  CALCIUM 7.9* 8.1* 8.0* 8.0* 8.2*  PHOS  --  4.5 4.5  --   --    GFR: Estimated Creatinine Clearance: 29.3 mL/min (A) (by C-G formula based on SCr of 3.11 mg/dL (H)). Liver Function Tests: Recent Labs  Lab 11/17/20 0449 11/18/20 0245  ALBUMIN 2.5* 2.6*   No results for input(s): LIPASE, AMYLASE in the last 168 hours. No results for input(s): AMMONIA in the last 168 hours. Coagulation Profile: No results for input(s): INR, PROTIME in the last 168 hours. Cardiac Enzymes: Recent Labs  Lab 11/16/20 1326  CKTOTAL 82   BNP (last 3 results) No results for input(s): PROBNP in the last 8760 hours. HbA1C: No  results for input(s): HGBA1C in the last 72 hours. CBG: Recent Labs  Lab 11/19/20 0745 11/19/20 1120 11/19/20 1559 11/19/20 2126 11/20/20 0715  GLUCAP 104* 147* 133* 138* 115*   Lipid Profile: No results for input(s): CHOL, HDL, LDLCALC, TRIG, CHOLHDL, LDLDIRECT in the last 72 hours. Thyroid Function Tests: No results for input(s): TSH, T4TOTAL, FREET4, T3FREE, THYROIDAB in the last 72 hours. Anemia Panel: No results for input(s): VITAMINB12, FOLATE, FERRITIN, TIBC, IRON, RETICCTPCT in the last 72 hours. Sepsis Labs: No results for input(s): PROCALCITON, LATICACIDVEN in the last 168 hours.  Recent Results (from the past 240 hour(s))  Resp Panel by RT-PCR (Flu A&B, Covid) Nasopharyngeal Swab     Status: None   Collection Time: 11/15/20  6:33 PM   Specimen: Nasopharyngeal Swab; Nasopharyngeal(NP) swabs in vial transport medium  Result Value Ref Range Status   SARS Coronavirus 2 by RT PCR NEGATIVE NEGATIVE Final    Comment: (NOTE) SARS-CoV-2 target nucleic acids are NOT DETECTED.  The SARS-CoV-2 RNA is generally detectable in upper respiratory specimens during the acute phase of infection. The  lowest concentration of SARS-CoV-2 viral copies this assay can detect is 138 copies/mL. A negative result does not preclude SARS-Cov-2 infection and should not be used as the sole basis for treatment or other patient management decisions. A negative result may occur with  improper specimen collection/handling, submission of specimen other than nasopharyngeal swab, presence of viral mutation(s) within the areas targeted by this assay, and inadequate number of viral copies(<138 copies/mL). A negative result must be combined with clinical observations, patient history, and epidemiological information. The expected result is Negative.  Fact Sheet for Patients:  EntrepreneurPulse.com.au  Fact Sheet for Healthcare Providers:  IncredibleEmployment.be  This test is no t yet approved or cleared by the [place] FDA and  has been authorized for detection and/or diagnosis of SARS-CoV-2 by FDA under an Emergency Use Authorization (EUA). This EUA will remain  in effect (meaning this test can be used) for the duration of the COVID-19 declaration under Section 564(b)(1) of the Act, 21 U.S.C.section 360bbb-3(b)(1), unless the authorization is terminated  or revoked sooner.       Influenza A by PCR NEGATIVE NEGATIVE Final   Influenza B by PCR NEGATIVE NEGATIVE Final    Comment: (NOTE) The Xpert Xpress SARS-CoV-2/FLU/RSV plus assay is intended as an aid in the diagnosis of influenza from Nasopharyngeal swab specimens and should not be used as a sole basis for treatment. Nasal washings and aspirates are unacceptable for Xpert Xpress SARS-CoV-2/FLU/RSV testing.  Fact Sheet for Patients: EntrepreneurPulse.com.au  Fact Sheet for Healthcare Providers: IncredibleEmployment.be  This test is not yet approved or cleared by the [place] FDA and has been authorized for detection and/or diagnosis of SARS-CoV-2 by FDA under  an Emergency Use Authorization (EUA). This EUA will remain in effect (meaning this test can be used) for the duration of the COVID-19 declaration under Section 564(b)(1) of the Act, 21 U.S.C. section 360bbb-3(b)(1), unless the authorization is terminated or revoked.  Performed at [facility], [place] [address] [place], [place] [address]   Culture, blood (routine x 2)     Status: None (Preliminary result)   Collection Time: 11/17/20 12:47 PM   Specimen: BLOOD  Result Value Ref Range Status   Specimen Description   Final    BLOOD RIGHT ANTECUBITAL Performed at [facility] [address] [place], [place] [address]    Special Requests   Final  BOTTLES DRAWN AEROBIC AND ANAEROBIC Blood Culture adequate volume Performed at [facility] [address], [place], [place] [address]    Culture   Final    NO GROWTH 2 DAYS Performed at [facility] [address] [place], [place] [address]    Report Status PENDING  Incomplete  Culture, Urine     Status: Abnormal   Collection Time: 11/17/20  1:40 PM   Specimen: Urine, Random  Result Value Ref Range Status   Specimen Description   Final    URINE, RANDOM Performed at [facility] [address], [place], [place] [address]    Special Requests   Final    NONE Performed at [facility], [place] [address], [place], [place] [address]    Culture MULTIPLE SPECIES PRESENT, SUGGEST RECOLLECTION (A)  Final   Report Status 11/19/2020 FINAL  Final  MRSA PCR Screening     Status: None   Collection Time: 11/17/20  3:49 PM   Specimen: Nasal Mucosa; Nasopharyngeal  Result Value Ref Range Status   MRSA by PCR NEGATIVE NEGATIVE Final    Comment:        The GeneXpert MRSA Assay (FDA approved for NASAL specimens only), is one component of a comprehensive MRSA colonization surveillance program. It is not intended to diagnose MRSA infection nor to  guide or monitor treatment for MRSA infections. Performed at [facility], [place] [address], [place], [place] [address]          Radiology Studies: No results found.      Scheduled Meds: . apixaban  5 mg Oral BID  . atorvastatin  10 mg Oral QHS  . Chlorhexidine Gluconate Cloth  6 each Topical Daily  . diltiazem  30 mg Oral Q8H  . docusate sodium  100 mg Oral Daily  . insulin aspart  0-5 Units Subcutaneous QHS  . insulin aspart  0-9 Units Subcutaneous TID WC  . lidocaine  1 patch Transdermal Q24H  . mouth rinse  15 mL Mouth Rinse BID  . metoprolol tartrate  25 mg Oral BID  . pantoprazole  40 mg Oral Daily  . sodium bicarbonate  1,300 mg Oral BID   Continuous Infusions: . cefTRIAXone (ROCEPHIN)  IV Stopped (11/19/20 1421)     LOS: 5 days    Time spent: 35 minutes    Bonnell Public, MD [facility]   If 7PM-7AM, please contact night-coverage [email or web address]  11/20/2020, 8:52 AM

## 2020-11-20 NOTE — Progress Notes (Signed)
Pt was transferred to [facility] by charge nurse. Pt stable. Pt's belongings with pt.

## 2020-11-21 DIAGNOSIS — B49 Unspecified mycosis: Secondary | ICD-10-CM

## 2020-11-21 DIAGNOSIS — N12 Tubulo-interstitial nephritis, not specified as acute or chronic: Secondary | ICD-10-CM

## 2020-11-21 DIAGNOSIS — N179 Acute kidney failure, unspecified: Secondary | ICD-10-CM | POA: Diagnosis not present

## 2020-11-21 DIAGNOSIS — A419 Sepsis, unspecified organism: Secondary | ICD-10-CM

## 2020-11-21 DIAGNOSIS — B377 Candidal sepsis: Secondary | ICD-10-CM

## 2020-11-21 LAB — GLUCOSE, CAPILLARY
Glucose-Capillary: 112 mg/dL — ABNORMAL HIGH (ref 70–99)
Glucose-Capillary: 107 mg/dL — ABNORMAL HIGH (ref 70–99)
Glucose-Capillary: 147 mg/dL — ABNORMAL HIGH (ref 70–99)
Glucose-Capillary: 118 mg/dL — ABNORMAL HIGH (ref 70–99)

## 2020-11-21 LAB — RENAL FUNCTION PANEL
Albumin: 2 g/dL — ABNORMAL LOW (ref 3.5–5.0)
Anion gap: 7 (ref 5–15)
BUN: 74 mg/dL — ABNORMAL HIGH (ref 8–23)
CO2: 23 mmol/L (ref 22–32)
Calcium: 8.1 mg/dL — ABNORMAL LOW (ref 8.9–10.3)
Chloride: 106 mmol/L (ref 98–111)
Creatinine, Ser: 3.87 mg/dL — ABNORMAL HIGH (ref 0.61–1.24)
GFR, Estimated: 16 mL/min — ABNORMAL LOW (ref >60)
Glucose, Bld: 112 mg/dL — ABNORMAL HIGH (ref 70–99)
Phosphorus: 4.1 mg/dL (ref 2.5–4.6)
Potassium: 5.6 mmol/L — ABNORMAL HIGH (ref 3.5–5.1)
Sodium: 136 mmol/L (ref 135–145)

## 2020-11-21 LAB — CBC WITH DIFFERENTIAL/PLATELET
Abs Immature Granulocytes: 0.68 10*3/uL — ABNORMAL HIGH (ref 0.00–0.07)
Basophils Absolute: 0.1 10*3/uL (ref 0.0–0.1)
Basophils Relative: 0 %
Eosinophils Absolute: 0.4 10*3/uL (ref 0.0–0.5)
Eosinophils Relative: 3 %
HCT: 33.3 % — ABNORMAL LOW (ref 39.0–52.0)
Hemoglobin: 10.7 g/dL — ABNORMAL LOW (ref 13.0–17.0)
Immature Granulocytes: 5 %
Lymphocytes Relative: 5 %
Lymphs Abs: 0.7 10*3/uL (ref 0.7–4.0)
MCH: 32.4 pg (ref 26.0–34.0)
MCHC: 32.1 g/dL (ref 30.0–36.0)
MCV: 100.9 fL — ABNORMAL HIGH (ref 80.0–100.0)
Monocytes Absolute: 1.1 10*3/uL — ABNORMAL HIGH (ref 0.1–1.0)
Monocytes Relative: 8 %
Neutro Abs: 11.2 10*3/uL — ABNORMAL HIGH (ref 1.7–7.7)
Neutrophils Relative %: 79 %
Platelets: 337 10*3/uL (ref 150–400)
RBC: 3.3 MIL/uL — ABNORMAL LOW (ref 4.22–5.81)
RDW: 15.1 % (ref 11.5–15.5)
WBC: 14.1 10*3/uL — ABNORMAL HIGH (ref 4.0–10.5)
nRBC: 0 % (ref 0.0–0.2)

## 2020-11-21 LAB — MAGNESIUM: Magnesium: 2.3 mg/dL (ref 1.7–2.4)

## 2020-11-21 MED ORDER — CEFDINIR 300 MG PO CAPS
300.0000 mg | ORAL_CAPSULE | Freq: Every day | ORAL | Status: DC
  Administered 2020-11-21: 300 mg via ORAL
  Filled 2020-11-21: qty 1

## 2020-11-21 MED ORDER — SODIUM CHLORIDE 0.9 % IV SOLN
100.0000 mg | INTRAVENOUS | Status: DC
  Administered 2020-11-22 – 2020-11-23 (×2): 100 mg via INTRAVENOUS
  Filled 2020-11-21 (×2): qty 100

## 2020-11-21 MED ORDER — CEFDINIR 300 MG PO CAPS: 300.0000 mg | ORAL_CAPSULE | Freq: Two times a day (BID) | ORAL | Status: DC

## 2020-11-21 MED ORDER — SODIUM ZIRCONIUM CYCLOSILICATE 10 G PO PACK
10.0000 g | PACK | Freq: Two times a day (BID) | ORAL | Status: AC
  Administered 2020-11-21 – 2020-11-23 (×3): 10 g via ORAL
  Filled 2020-11-21 (×4): qty 1

## 2020-11-21 MED ORDER — SODIUM CHLORIDE 0.9 % IV SOLN
200.0000 mg | Freq: Once | INTRAVENOUS | Status: AC
  Administered 2020-11-21: 200 mg via INTRAVENOUS
  Filled 2020-11-21: qty 200

## 2020-11-21 NOTE — Progress Notes (Signed)
PHARMACY - PHYSICIAN COMMUNICATION CRITICAL VALUE ALERT - BLOOD CULTURE IDENTIFICATION (BCID)  Zachary Shannon. is an 70 y.o. male who presented to [facility] on 11/15/2020 with a chief complaint of weakness, back pain, hematuria.  Admitted with AKI with urology and nephrology consultation.  Assessment:  Micro called today to report yeast in one blood culture bottle (aerobic) out of one set; no organisms seen yesterday.  Name of physician (or Provider) Contacted: [place], Vu  Current antibiotics: Ceftriaxone 2 g IV daily  Changes to prescribed antibiotics recommended:  Initiated Eraxis 200 mg IV x 1 then 100 mg IV qday.  Notified ID provider who agreed with initiation of Eraxis and ordered to repeat blood cultures x 2.  No results found for this or any previous visit.  Zachary Shannon, PharmD, BCPS 11/21/2020  11:44 AM

## 2020-11-21 NOTE — Consult Note (Signed)
[name] for Infectious Disease    Date of Admission:  11/15/2020     Reason for Consult: fungemia    Referring Provider: autochamp consult   Lines:  Peripheral iv's  Abx: 5/01-c anidulafungin 4/27-c ceftriaxone         Assessment: Fungemia Sepsis Recurrent bcg-refractory high grade bladder cancer On 4/05 --> S/p cystoscopy with radical cystaprostatectomy and ileal conduit creation; pelvic node dissection; bilateral inguinal hernia repair laparoscopically; ureteral stent placement. The margin appear clear and nodes negative for malignancy -- only bladder involved Prostate cancer -- pathology with gleeson 6 small focus adenocarcinoma Troponinemia Pulmonary hypertension Dilated bilateral atria Volume overload aki  70 y.o. male pmh bladder tumor s/p cystoprostatectomy and ileal conduit creation 4/06 with placement of ileal conduit and ureteral stents along with bilateral inguinal hernia repair, chf, cad, afib on anticoagulation, ckd 3, admitted 4/25 for confusion, flanks pain, suprapubic pain, found to have severe sepsis (aki/confusion) and ultimately fungemia  4/27 bcx yeast 4/27 ucx many species/contaminated  This is rather a subtle presentation of sepsis I agree likely complicated uti with recent extensive gu procedures. Bilateral inguinal area and imaging without concern of soft tissue infection. The fungemia source as mentioned is genitourinary in nature  tte no obvious vegetation. No visual changes/deficit that is new, but would need outpatient ophthalmologic exam to r/o ophthalmic involvement with yeast. At this time no sign suggestive of endovascular infection but will need to repeat blood cx  aki some improvement but fluctuating -- renal following; ace-I and prerenal related it appears  Plan: 1. Repeat blood culture (this was done after the dose of eraxis) 2. Continue eraxis for now 3. Can transition ceftriaxone to cefdinir to finish 10 day  complicated uti/pyelo treatment 4. Tee if persistent fungemia 5. Would need outpatient ophthalmologic exam given fungemia      ------------------------------------------------ Principal Problem:   AKI (acute kidney injury) ([place]) Active Problems:   Bladder cancer ([place])   PAF (paroxysmal atrial fibrillation) ([place])   Hyperkalemia   Hyperlipidemia   Chest pressure    HPI: Zachary Shannon. is a 70 y.o. male pmh bladder tumor s/p cystoprostatectomy and ileal conduit creation 4/06 with placement of ileal conduit and ureteral stents along with bilateral inguinal hernia repair, chf, cad, afib on anticoagulation, ckd 3, admitted 4/25 for confusion, flanks pain, suprapubic pain, found to have severe sepsis (aki/confusion) and ultimately fungemia  He initially did well post-op He started experiencing generalized weakness/malaise and poor appetite several days prior to admission and along with confusion and flank pain/suprapubic pain. He reports the flank pain bilaterally is very bad, but the suprapubic pain is mild-moderate only  Denies f/c, chest pain, sob, cough, rash, joint pain, headache  No cardiac devices/bodily prosthesis present  Hospital course: Initially afebrile and had remained so during the admission Has not been hypotensive but does have tachycardia Initial wbc 16, cr 6s Troponin slightly elevated Ct renal stone no hydronphrosis Chest imaging without evidence suggestive pulm infiltrate Echo with elevated pulmonary arterial pressure along with dilated atrium and suggestion of volume overload Urology/nephrology following.  Initially presumed complicated uti and ceftriaxone given   Ultimately admission blood culture returned with yeast on hd #6; patient started on eraxis  Overall he is still feeling poorly with the flank pain and poor appetite/malaise   No family history on file.  Social History   Tobacco Use  . Smoking status: Former Smoker    Packs/day:  0.50  Years: 28.00    Pack years: 14.00    Types: Cigarettes    Quit date: 10/08/2008    Years since quitting: 12.1  . Smokeless tobacco: Former Systems developer    Quit date: 10/08/2008  Vaping Use  . Vaping Use: Never used  Substance Use Topics  . Alcohol use: Never  . Drug use: Never    No Known Allergies  Review of Systems: ROS All Other ROS was negative, except mentioned above   Past Medical History:  Diagnosis Date  . Anticoagulant long-term use    eliquis--- managed by cardiology  . Bladder tumor   . Cancer ([place])    bladder  . Cardiomyopathy [facility])    dx 2009 per cardiac cath w/ ef 20-25%;   per cardiology note   . Coronary artery disease cardiologist--- dr Ronnell Freshwater  ([facility] in [place] )   per cardiologist note dated 09-16-2019 , received via fax,  pt had cardiac cath 2009 done at [facility] that showed CTO of LAD with excellent collateralization from LCx and RCA, no PCI performed, sig. cardiomyopathy ef 20-25%;  per note stated improved to 45-50% (no documentation available in note and no echo)  . Dysrhythmia    Afib  . Hematuria   . Hyperlipidemia   . Hypertension    followed by pcp  . Nocturia   . PAF (paroxysmal atrial fibrillation) [facility])    cardiologist--- dr Ronnell Freshwater--- dx 2009       Scheduled Meds: . apixaban  5 mg Oral BID  . atorvastatin  10 mg Oral QHS  . Chlorhexidine Gluconate Cloth  6 each Topical Daily  . diltiazem  30 mg Oral Q8H  . docusate sodium  100 mg Oral Daily  . insulin aspart  0-5 Units Subcutaneous QHS  . insulin aspart  0-9 Units Subcutaneous TID WC  . lidocaine  1 patch Transdermal Q24H  . mouth rinse  15 mL Mouth Rinse BID  . metoprolol tartrate  25 mg Oral BID  . pantoprazole  40 mg Oral Daily  . sodium bicarbonate  1,300 mg Oral BID  . sodium zirconium cyclosilicate  10 g Oral BID   Continuous Infusions: . [START ON 11/22/2020] anidulafungin    . anidulafungin 200 mg (11/21/20  1403)  . cefTRIAXone (ROCEPHIN)  IV 2 g (11/21/20 1312)   PRN Meds:.acetaminophen **OR** acetaminophen, calcium carbonate (dosed in mg elemental calcium), camphor-menthol **AND** hydrOXYzine, docusate sodium, hydrALAZINE, HYDROmorphone, labetalol, nitroGLYCERIN, ondansetron **OR** ondansetron (ZOFRAN) IV, oxyCODONE-acetaminophen, sorbitol, zolpidem   OBJECTIVE: Blood pressure 113/79, pulse 75, temperature 98.1 F (36.7 C), temperature source Oral, resp. rate 18, height 6' (1.829 m), weight 117.8 kg, SpO2 94 %.  Physical Exam General/constitutional: no distress, sitting in chair watching tv, cooperative HEENT: Normocephalic, PER, Conj Clear, EOMI, Oropharynx clear Neck supple CV: rrr no mrg Lungs: clear to auscultation, normal respiratory effort Abd: Soft, Nontender; the right side osteomy bag has lots of brown sediment and minimal urine Ext: no edema Skin: stage 1-2 pressure ulcer left lower back/sacrum Neuro: nonfocal MSK: bilateral cva tenderness to palpation; there is a left flank lidocaine patch. No peripheral joint/midline tenderness     Lab Results Lab Results  Component Value Date   WBC 14.1 (H) 11/21/2020   HGB 10.7 (L) 11/21/2020   HCT 33.3 (L) 11/21/2020   MCV 100.9 (H) 11/21/2020   PLT 337 11/21/2020    Lab Results  Component Value Date   CREATININE 3.87 (H) 11/21/2020   BUN 74 (H)  11/21/2020   NA 136 11/21/2020   K 5.6 (H) 11/21/2020   CL 106 11/21/2020   CO2 23 11/21/2020    Lab Results  Component Value Date   ALT 20 11/09/2020   AST 23 11/09/2020   ALKPHOS 107 11/09/2020   BILITOT 1.1 11/09/2020      Microbiology: Recent Results (from the past 240 hour(s))  Resp Panel by RT-PCR (Flu A&B, Covid) Nasopharyngeal Swab     Status: None   Collection Time: 11/15/20  6:33 PM   Specimen: Nasopharyngeal Swab; Nasopharyngeal(NP) swabs in vial transport medium  Result Value Ref Range Status   SARS Coronavirus 2 by RT PCR NEGATIVE NEGATIVE Final    Comment:  (NOTE) SARS-CoV-2 target nucleic acids are NOT DETECTED.  The SARS-CoV-2 RNA is generally detectable in upper respiratory specimens during the acute phase of infection. The lowest concentration of SARS-CoV-2 viral copies this assay can detect is 138 copies/mL. A negative result does not preclude SARS-Cov-2 infection and should not be used as the sole basis for treatment or other patient management decisions. A negative result may occur with  improper specimen collection/handling, submission of specimen other than nasopharyngeal swab, presence of viral mutation(s) within the areas targeted by this assay, and inadequate number of viral copies(<138 copies/mL). A negative result must be combined with clinical observations, patient history, and epidemiological information. The expected result is Negative.  Fact Sheet for Patients:  EntrepreneurPulse.com.au  Fact Sheet for Healthcare Providers:  IncredibleEmployment.be  This test is no t yet approved or cleared by the [place] FDA and  has been authorized for detection and/or diagnosis of SARS-CoV-2 by FDA under an Emergency Use Authorization (EUA). This EUA will remain  in effect (meaning this test can be used) for the duration of the COVID-19 declaration under Section 564(b)(1) of the Act, 21 U.S.C.section 360bbb-3(b)(1), unless the authorization is terminated  or revoked sooner.       Influenza A by PCR NEGATIVE NEGATIVE Final   Influenza B by PCR NEGATIVE NEGATIVE Final    Comment: (NOTE) The Xpert Xpress SARS-CoV-2/FLU/RSV plus assay is intended as an aid in the diagnosis of influenza from Nasopharyngeal swab specimens and should not be used as a sole basis for treatment. Nasal washings and aspirates are unacceptable for Xpert Xpress SARS-CoV-2/FLU/RSV testing.  Fact Sheet for Patients: EntrepreneurPulse.com.au  Fact Sheet for Healthcare  Providers: IncredibleEmployment.be  This test is not yet approved or cleared by the [place] FDA and has been authorized for detection and/or diagnosis of SARS-CoV-2 by FDA under an Emergency Use Authorization (EUA). This EUA will remain in effect (meaning this test can be used) for the duration of the COVID-19 declaration under Section 564(b)(1) of the Act, 21 U.S.C. section 360bbb-3(b)(1), unless the authorization is terminated or revoked.  Performed at [facility], [place] [address] [place], [place] [address]   Culture, blood (routine x 2)     Status: None (Preliminary result)   Collection Time: 11/17/20 12:47 PM   Specimen: BLOOD  Result Value Ref Range Status   Specimen Description   Final    BLOOD RIGHT ANTECUBITAL Performed at [facility] [address], [place],  [address]    Special Requests   Final    BOTTLES DRAWN AEROBIC AND ANAEROBIC Blood Culture adequate volume Performed at [facility] [address] [place], [place] [address]    Culture  Setup Time   Final    YEAST AEROBIC BOTTLE ONLY CRITICAL RESULT  CALLED TO, READ BACK BY AND VERIFIED WITH: TERRY GREEN PHARMD @1002  11/21/20 EB    Culture   Final    YEAST CULTURE REINCUBATED FOR BETTER GROWTH Performed at [facility], [address] [place], [place] [address]    Report Status PENDING  Incomplete  Culture, Urine     Status: Abnormal   Collection Time: 11/17/20  1:40 PM   Specimen: Urine, Random  Result Value Ref Range Status   Specimen Description   Final    URINE, RANDOM Performed at [facility] [address], [place], [place] [address]    Special Requests   Final    NONE Performed at [facility], [place] [address] [place], [place] [address]    Culture MULTIPLE SPECIES PRESENT, SUGGEST RECOLLECTION (A)  Final   Report Status 11/19/2020 FINAL  Final  MRSA PCR  Screening     Status: None   Collection Time: 11/17/20  3:49 PM   Specimen: Nasal Mucosa; Nasopharyngeal  Result Value Ref Range Status   MRSA by PCR NEGATIVE NEGATIVE Final    Comment:        The GeneXpert MRSA Assay (FDA approved for NASAL specimens only), is one component of a comprehensive MRSA colonization surveillance program. It is not intended to diagnose MRSA infection nor to guide or monitor treatment for MRSA infections. Performed at [facility], [place] [address], [place], [place] [address]      Serology: 4/05 hiv screen negative  Pathology: 4/06 prostate/pelvic nodes/bladder SURGICAL PATHOLOGY  CASE: [id number]  PATIENT: Rutherford Guys  Surgical Pathology Report      Clinical History: Bladder cancer, bilateral inguinal hernias ([facility])      FINAL MICROSCOPIC DIAGNOSIS:   A. URETER, LEFT DISTAL MARGIN, BIOPSY:  Negative for carcinoma   B. URETER, RIGHT DISTAL MARGIN, BIOPSY:  Negative for carcinoma   C. LYMPH NODES, SENTINEL, LEFT EXTERNAL AND INTERNAL ILIAC, EXCISION:  Two benign lymph nodes (0/2)   D. LYMPH NODE, SENTINEL, LEFT OBTURATOR, EXCISION:  Four benign lymph nodes (0/4)   E. LYMPH NODES, SENTINEL, RIGHT EXTERNAL AND INTERNAL ILIAC, EXCISION:  Two benign lymph nodes (0/2)   F. LYMPH NODE, SENTINEL, RIGHT OBTURATOR, EXCISION:  Two benign lymph nodes (0/2)   G. 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 URETER, LEFT FINAL DISTAL MARGIN, BIOPSY:  Negative for carcinoma  I. URETER, RIGHT FINAL DISTAL MARGIN, BIOPSY:  Negative for  carcinoma   URINARY BLADDER:  Procedure: Cystoprostatectomy  Tumor Site: Left lateral wall  Tumor Size: 0.5 cm  Histologic Type: papillary urothelial carcinoma  Histologic Grade: High grade  Tumor Extension: Noninvasive  Margin for invasive tumor: Negative  Margin for Carcinoma in situ/Noninvasive papillary urothelial carcinoma:  Negative  Lymphovascular Invasion: Negative  Regional Lymph Nodes:      No lymph nodes submitted or found: NA      Number of Lymph Nodes Involved: 0      Number of Lymph Nodes Examined: 10  Distant metastasis: NA  Pathologic Stage Classification (pTNM, AJCC 8th Edition): pTa, pN0  Representative Tumor Block: G11  Comment(s): Small focus of  noninvasive papillary urothelial carcinoma is  identified at the left lateral wall near left ureter oz. Reactive BCG  granulomas is present.   PROSTATE GLAND:  Procedure: Radical Prostatectomy  Histologic Type: Acinar  Histologic Grade, Grade Group and Gleason Score: Gleason score 3+3=6  (grade group 1)  Percentage of Pattern 4 in Gleason Score 7 (3+4, 4+3) Cancer (report if  applicable): NA  Intraductal carcinoma (incorporated into Grade): Negative  Cribriform gland (applicable to Gleason score 7 or 8 cancer only):  Negative  Tumor Quantitation (estimated percentage of prostate involved by tumor):  Less than 5%  Extraprostatic Extension: Negative  Urinary Bladder Neck Invasion: Negative  Seminal Vesicle Invasion: Negative  Margins: Negative  Lymphovascular invasion: Negative  Treatment Effect: Negative  Regional Lymph Nodes:      No lymph nodes submitted or found: NA      Number of Lymph Nodes Involved: 0      Number of Lymph Nodes Examined: 10  Distant metastasis: NA  Pathologic Stage Classification (pTNM, AJCC 8th Edition): pT2, pN0  Representative Tumor Block: G17  Comment(s): small focus of prostatic adenocarcinoma involves left  anterior at the mid portion of the gland.   Additional findings: BCG granulomatous inflammation   INTRAOPERATIVE DIAGNOSIS:   A1. URETER, LEFT DISTAL MARGIN, FROZEN SECTION:     Negative for carcinoma.     Rapid intraoperative consult diagnosis rendered by Dr. Vic Ripper @  [phone] 10/27/2020.   B1. URETER, RIGHT DISTAL MARGIN, FROZEN SECTION:     Negative for carcinoma.     Rapid intraoperative consult diagnosis rendered by Dr. Vic Ripper @  1044 10/27/2020.   Imaging: If present, new imagings (plain films, ct scans, and mri) have been personally visualized and interpreted; radiology reports have been reviewed. Decision making incorporated into the Impression / Recommendations.  4/25 ct renal stone protocol 1. Status post cystoprostatectomy with ileal conduit creation. Similar appearance of ureteric stents in place, without urinary tract calculi or hydronephrosis. 2. Similar to slight increase in small volume abdominopelvic fluid. Minimal decrease in extensive subcutaneous emphysema. 3. Right-sided fluid containing and left-sided fat containing inguinal hernias, similar. 4. Coronary artery atherosclerosis. Aortic Atherosclerosis  4/25 cxr 2 view pulm vascular congestion  4/26 thoracic mri 1. Small right paracentral disc protrusion at T6-7 with resultant mild flattening of the right ventral cord, but no significant stenosis. Finding could contribute to patient's symptoms. 2. Otherwise unremarkable MRI of the thoracic spine for age. 3. Trace layering bilateral pleural effusions.  4/27 cxr Mild cardiomegaly. No edema or airspace opacity  4/27 vq scan No evidence PE  4/28 tte 1. Left ventricular ejection fraction, by estimation, is 55 to 60%. The  left ventricle has normal function. The left ventricle has no regional  wall motion abnormalities. Left ventricular diastolic function could not  be evaluated.  2. Right ventricular systolic function is mildly reduced. The right  ventricular size is mildly enlarged.  There is moderately elevated  pulmonary artery systolic pressure. The estimated right ventricular  systolic pressure is 0000000 mmHg.  3. Left atrial size was severely dilated.  4. Right atrial size was moderately dilated.  5. The mitral valve is grossly normal. Trivial mitral valve  regurgitation. No evidence of mitral stenosis.  6. The aortic valve is tricuspid. There is mild calcification of the  aortic valve. There is mild thickening of the aortic valve. Aortic valve  regurgitation is not visualized. No aortic stenosis is present.  7. Aortic dilatation noted. There is mild dilatation of the aortic  root,  measuring 42 mm. There is mild dilatation of the aortic root, measuring 43  mm.  8. The inferior vena cava is dilated in size with >50% respiratory  variability, suggesting right atrial pressure of 8 mmHg.   Zachary Shannon, [facility] for [facility] [place] [phone] pager    11/21/2020, 4:47 PM

## 2020-11-21 NOTE — Progress Notes (Signed)
Occupational Therapy Treatment Patient Details Name: Zachary Shannon. MRN: [id number] DOB: 07-29-1950 Today's Date: 11/21/2020    History of present illness 70 year old with PMH significant of bladder tumor status post recent cystoprostatectomy on April 6 with placement of ileal conduit and lateral stents.  Patient presented to the ER complaining of flank and suprapubic pain and confusion. Pt admitted for acute renal failure. Transferred to ICU 4/27 for cardiac monitoring   OT comments  Pt progressing slow but steady towards acute OT goals. Focus of session was toilet transfers simulated with stand-pivot from EOB to recliner. Pt fatigues quickly with minimal OOB activity. Up in recliner at end of session, willing to sit up for a bit but c/o increased mid-back pain (FACES 6/10). Nurse gave pain meds at end of session. D/c plan updated to SNF for rehab prior to returning home.    Follow Up Recommendations  SNF    Equipment Recommendations  Other (comment) (defer to next venue)    Recommendations for Other Services      Precautions / Restrictions Precautions Precautions: Fall Precaution Comments: monitor HR/vitals, colostomy Restrictions Weight Bearing Restrictions: No       Mobility Bed Mobility Overal bed mobility: Needs Assistance Bed Mobility: Supine to Sit     Supine to sit: Mod assist;HOB elevated     General bed mobility comments: assist to powerup trunk and pivot hips to full EOB position    Transfers Overall transfer level: Needs assistance Equipment used: Rolling walker (2 wheeled) Transfers: Sit to/from [facility] Sit to Stand: Min guard;From elevated surface Stand pivot transfers: Min assist       General transfer comment: min guard to min A for safety. Pt fatigues quickly. Pivotal steps EOB to recliner.    Balance Overall balance assessment: Needs assistance Sitting-balance support: Feet supported Sitting balance-Leahy Scale:  Good Sitting balance - Comments: seated EOB   Standing balance support: Bilateral upper extremity supported Standing balance-Leahy Scale: Poor Standing balance comment: reliant on UE support                           ADL either performed or assessed with clinical judgement   ADL Overall ADL's : Needs assistance/impaired                         Toilet Transfer: Minimal assistance;Stand-pivot;Cueing for safety;RW;Min guard Toilet Transfer Details (indicate cue type and reason): fatigued quickly. SPT EOB to recliner.           General ADL Comments: bed mobility at mod A level to power up trunk and pivot hips to full EOB position. Extra time and effort, "walks" each hip at a time. Pt then completed SPT to sit up in recliner. min guard-light min A, used rw. Fatigues quickly with OOB activity     Vision       Perception     Praxis      Cognition Arousal/Alertness: Awake/alert Behavior During Therapy: WFL for tasks assessed/performed;Flat affect Overall Cognitive Status: Within Functional Limits for tasks assessed                                          Exercises     Shoulder Instructions       General Comments Pt with increase in midback pain once in recliner. RN notified  and brought pain meds. Worked on positioning in the recliner to help reduce/stabilize pain level.    Pertinent Vitals/ Pain       Pain Assessment: Faces Faces Pain Scale: Hurts even more Pain Location: mid back Pain Descriptors / Indicators: Grimacing Pain Intervention(s): Monitored during session;Limited activity within patient's tolerance;Repositioned;Patient requesting pain meds-RN notified;RN gave pain meds during session  Home Living                                          Prior Functioning/Environment              Frequency  Min 2X/week        Progress Toward Goals  OT Goals(current goals can now be found in the care plan  section)  Progress towards OT goals: Progressing toward goals  Acute Rehab OT Goals Patient Stated Goal: feel better OT Goal Formulation: With patient Time For Goal Achievement: 12/01/20 Potential to Achieve Goals: Good ADL Goals Pt Will Perform Grooming: Independently;standing Pt Will Perform Lower Body Dressing: Independently;sit to/from stand;sitting/lateral leans Pt Will Transfer to Toilet: Independently;ambulating Pt Will Perform Toileting - Clothing Manipulation and hygiene: Independently;sitting/lateral leans;sit to/from stand  Plan Discharge plan needs to be updated    Co-evaluation                 AM-PAC OT "6 Clicks" Daily Activity     Outcome Measure   Help from another person eating meals?: None Help from another person taking care of personal grooming?: A Little Help from another person toileting, which includes using toliet, bedpan, or urinal?: A Little Help from another person bathing (including washing, rinsing, drying)?: A Lot Help from another person to put on and taking off regular upper body clothing?: A Little Help from another person to put on and taking off regular lower body clothing?: A Lot 6 Click Score: 17    End of Session Equipment Utilized During Treatment: Rolling walker;Gait belt  OT Visit Diagnosis: Other abnormalities of gait and mobility (R26.89);Pain   Activity Tolerance Patient limited by fatigue;Patient limited by pain   Patient Left in chair;with call bell/phone within reach   Nurse Communication Mobility status;Patient requests pain meds        Time: 1448-1510 OT Time Calculation (min): 22 min  Charges: OT General Charges $OT Visit: 1 Visit OT Treatments $Self Care/Home Management : 8-22 mins  Tyrone Schimke, OT Acute Rehabilitation Services Pager: [phone] Office: [phone]    Hortencia Pilar 11/21/2020, 3:23 PM

## 2020-11-21 NOTE — Progress Notes (Signed)
PROGRESS NOTE    Zachary Shannon.  AST:[id number] DOB: [birth year] DOA: 11/15/2020 PCP: Minna Antis, DO   Brief Narrative: 70 year old with past medical history significant of bladder tumor status post recent cystoprostatectomy on April 6 with placement of ileal conduit and lateral stents.  Patient presented to the ER complaining of flank and suprapubic pain and confusion.  Patient was found to have a creatinine more than 6.  His creatinine was 1.1 when he left the hospital post surgery.  He has a history of paroxysmal A. fib, chronic systolic heart failure, CAD and hyperlipidemia he is currently taking Eliquis.  Family noticed progressive weakness.  Denies taking NSAID but has been taking 80s.  Patient admitted with acute renal failure, CT renal stone showed no acute findings. Treated with IV fluids, IV antibiotics to cover for Pyelo. Renal function improving.  He develops Chest pain work up negative for MI and PE.   11/20/2020: Available records reviewed.  Patient seen.  No new complaints.  Serum creatinine continues to improve.  Input from nephrology and cardiology team is highly appreciated.  Vitals are stable.  We will transfer patient to telemetry floor.  11/21/2020: Patient seen.  Worsening renal function with hyperkalemia.  Potassium is 5.6.  Start patient on [place].  Have low threshold to reconsult nephrology team if serum creatinine continues to worsen.  Heart rate is controlled.   Assessment & Plan:   Principal Problem:   AKI (acute kidney injury) ([facility]) Active Problems:   Bladder cancer ([place])   PAF (paroxysmal atrial fibrillation) ([facility])   Hyperkalemia   Hyperlipidemia   Chest pressure   1-AKI; creatinine baseline 1.0 Could be related to hypovolemia and ACE use, Pyelo.  Treated with  bicarb drip. Nephrology and urology following.  CK level normal.  Plan to treat for pyelo, due to back pain, leukocytosis. Monitor WBC Cr stable, down to 3.4 Off fluids, renal  function improving. Continue to monitor. 11/20/2020: Serum creatinine is down to 3.11.  CO2 is 23.  Continue to monitor renal function. 11/21/2020: Serum creatinine has risen from 3.11 to 3.87 overnight, with potassium of 5.6.  We will start patient on [place].  Monitor electrolytes and renal function.  Have a low threshold to reconsult nephrology team.  Suspect component of ATN.  2-Chest Pressure;  He complained of chest pressure, pain 4/27. Fluids has been discontinue by nephrologist.  Patient is on Eliquis, less likely PE, But VQ scan ordered, negative for PE>  Troponin flat, not significantly elevated. ECHO; normal Ef, No wall motion abnormalities.  Cardiology sign off.  Continue with PPI>   3-Back pain: He complains of back pain shoulder blade  area mid back initially started on the left and now is on the right. -MRI showed mild disc protrussion. Discussed with Dr Arneta Cliche who review MRI, he doesn't see a significant disc protrusion,  unlikely that will explain patient's pain.  -Continue with oxycodone as needed. -Continue Lidocaine patch, which has been helping.  -VQ scan negative for PE>   4-A fib; Continue with Eliquis, metoprolol. Cardizem initially on  hold due to hypotension 4/27.  HR elevated today, plan to resume Cardizem Lower dose.. BP elevated as well. 11/20/2020: Heart rate is controlled.  Blood pressure is stable. 11/21/2020: Patient's heart rate remains controlled.  5-Hyperkalemia;  -Received Lokelma and Bicarb change to oral. 11/20/2020: Potassium today is 4.9. 11/21/2020: Potassium is 5.6 today.  Start Lokelma 10 g twice daily x3 doses.  Monitor renal function and electrolytes closely.  6-Mild hyponatremia: Monitor. 11/20/2020: Resolved.  Sodium is 138 today. 11/21/2020: Sodium is 136 today.  7-Bladder cancer status post recent cystoprostatectomy and ileal conduit and lateral stent placement October 27, 2020. -Appreciate Dr Tresa Moore evaluation.  -plan to treat for pyelo.     8-Leukocytosis; reactive , improved. Plan to treat for pyelo Monitor.  Trending down.   Hypotension; transient. Responded to fluids.  Monitor step down.  Plan to transfer to telemetry if he tolerates cardizem. 11/21/2020: Hypotension has resolved.  Confusion; reported on admission, transient. Unspecific.   Estimated body mass index is 35.22 kg/m as calculated from the following:   Height as of this encounter: 6' (1.829 m).   Weight as of this encounter: 117.8 kg.   DVT prophylaxis: Eliquis Code Status: Full code Family Communication: Daughter at bedside 4/28 Disposition Plan:  Status is: Inpatient  Remains inpatient appropriate because:IV treatments appropriate due to intensity of illness or inability to take PO   Dispo: The patient is from: Home              Anticipated d/c is to: Home              Patient currently is not medically stable to d/c. Patient will benefit from SNF for rehab.    Difficult to place patient No        Consultants:   Urology  Nephrology   Procedures:     Antimicrobials:  IV ceftriaxone started on 11/17/2020.    Subjective: No new complaints. Patient is not particularly chatty. No shortness of breath. No chest pain.    Objective: Vitals:   11/20/20 2257 11/21/20 0241 11/21/20 0629 11/21/20 1401  BP: 124/80 (!) 145/77 121/70 113/79  Pulse: 76 81 95 75  Resp: 15 16 15 18   Temp: 98.9 F (37.2 C) 98.5 F (36.9 C) 98.9 F (37.2 C) 98.1 F (36.7 C)  TempSrc: Oral Oral Oral Oral  SpO2: 91% 91% 92% 94%  Weight:      Height:        Intake/Output Summary (Last 24 hours) at 11/21/2020 1800 Last data filed at 11/21/2020 1700 Gross per 24 hour  Intake 728.8 ml  Output 1325 ml  Net -596.2 ml   Filed Weights   11/17/20 1546 11/18/20 0500 11/19/20 0447  Weight: 117.9 kg 117.1 kg 117.8 kg    Examination:  General exam: Patient is moderately obese.  Patient is not in any distress.  Patient is awake and alert.  Patient is  also edematous.   Respiratory system: Clear to auscultation.   Cardiovascular system: S1-S2.  Gastrointestinal system: Obese, soft and nontender.  Patient has right-sided colostomy bag.  Central nervous system: Awake and alert.  Patient moves all extremities.  Extremities: Patient has edema of the extremities.    Data Reviewed: I have personally reviewed following labs and imaging studies  CBC: Recent Labs  Lab 11/17/20 0449 11/18/20 0245 11/19/20 0235 11/20/20 0239 11/21/20 1258  WBC 12.1* 14.7* 13.5* 13.5* 14.1*  NEUTROABS  --   --   --   --  11.2*  HGB 10.7* 11.1* 11.3* 11.1* 10.7*  HCT 33.1* 34.6* 35.1* 35.3* 33.3*  MCV 98.8 99.7 100.6* 102.3* 100.9*  PLT 341 345 340 349 [id number]   Basic Metabolic Panel: Recent Labs  Lab 11/17/20 0449 11/18/20 0245 11/19/20 0235 11/20/20 0239 11/21/20 1258  NA 134* 132* 135 138 136  K 5.3* 5.1 5.0 4.9 5.6*  CL 100 101 103 105 106  CO2 23 21* 21* 23  23  GLUCOSE 110* 105* 103* 105* 112*  BUN 92* 90* 80* 73* 74*  CREATININE 4.34* 4.17* 3.46* 3.11* 3.87*  CALCIUM 8.1* 8.0* 8.0* 8.2* 8.1*  MG  --   --   --   --  2.3  PHOS 4.5 4.5  --   --  4.1   GFR: Estimated Creatinine Clearance: 23.5 mL/min (A) (by C-G formula based on SCr of 3.87 mg/dL (H)). Liver Function Tests: Recent Labs  Lab 11/17/20 0449 11/18/20 0245 11/21/20 1258  ALBUMIN 2.5* 2.6* 2.0*   No results for input(s): LIPASE, AMYLASE in the last 168 hours. No results for input(s): AMMONIA in the last 168 hours. Coagulation Profile: No results for input(s): INR, PROTIME in the last 168 hours. Cardiac Enzymes: Recent Labs  Lab 11/16/20 1326  CKTOTAL 82   BNP (last 3 results) No results for input(s): PROBNP in the last 8760 hours. HbA1C: No results for input(s): HGBA1C in the last 72 hours. CBG: Recent Labs  Lab 11/20/20 1630 11/20/20 2132 11/21/20 0754 11/21/20 1157 11/21/20 1614  GLUCAP 108* 109* 112* 107* 147*   Lipid Profile: No results for input(s):  CHOL, HDL, LDLCALC, TRIG, CHOLHDL, LDLDIRECT in the last 72 hours. Thyroid Function Tests: No results for input(s): TSH, T4TOTAL, FREET4, T3FREE, THYROIDAB in the last 72 hours. Anemia Panel: No results for input(s): VITAMINB12, FOLATE, FERRITIN, TIBC, IRON, RETICCTPCT in the last 72 hours. Sepsis Labs: No results for input(s): PROCALCITON, LATICACIDVEN in the last 168 hours.  Recent Results (from the past 240 hour(s))  Resp Panel by RT-PCR (Flu A&B, Covid) Nasopharyngeal Swab     Status: None   Collection Time: 11/15/20  6:33 PM   Specimen: Nasopharyngeal Swab; Nasopharyngeal(NP) swabs in vial transport medium  Result Value Ref Range Status   SARS Coronavirus 2 by RT PCR NEGATIVE NEGATIVE Final    Comment: (NOTE) SARS-CoV-2 target nucleic acids are NOT DETECTED.  The SARS-CoV-2 RNA is generally detectable in upper respiratory specimens during the acute phase of infection. The lowest concentration of SARS-CoV-2 viral copies this assay can detect is 138 copies/mL. A negative result does not preclude SARS-Cov-2 infection and should not be used as the sole basis for treatment or other patient management decisions. A negative result may occur with  improper specimen collection/handling, submission of specimen other than nasopharyngeal swab, presence of viral mutation(s) within the areas targeted by this assay, and inadequate number of viral copies(<138 copies/mL). A negative result must be combined with clinical observations, patient history, and epidemiological information. The expected result is Negative.  Fact Sheet for Patients:  EntrepreneurPulse.com.au  Fact Sheet for Healthcare Providers:  IncredibleEmployment.be  This test is no t yet approved or cleared by the [place] FDA and  has been authorized for detection and/or diagnosis of SARS-CoV-2 by FDA under an Emergency Use Authorization (EUA). This EUA will remain  in effect (meaning  this test can be used) for the duration of the COVID-19 declaration under Section 564(b)(1) of the Act, 21 U.S.C.section 360bbb-3(b)(1), unless the authorization is terminated  or revoked sooner.       Influenza A by PCR NEGATIVE NEGATIVE Final   Influenza B by PCR NEGATIVE NEGATIVE Final    Comment: (NOTE) The Xpert Xpress SARS-CoV-2/FLU/RSV plus assay is intended as an aid in the diagnosis of influenza from Nasopharyngeal swab specimens and should not be used as a sole basis for treatment. Nasal washings and aspirates are unacceptable for Xpert Xpress SARS-CoV-2/FLU/RSV testing.  Fact Sheet for Patients: EntrepreneurPulse.com.au  Fact Sheet for Healthcare Providers: IncredibleEmployment.be  This test is not yet approved or cleared by the [place] FDA and has been authorized for detection and/or diagnosis of SARS-CoV-2 by FDA under an Emergency Use Authorization (EUA). This EUA will remain in effect (meaning this test can be used) for the duration of the COVID-19 declaration under Section 564(b)(1) of the Act, 21 U.S.C. section 360bbb-3(b)(1), unless the authorization is terminated or revoked.  Performed at [facility], [place] [address] [place], [place] [address]   Culture, blood (routine x 2)     Status: None (Preliminary result)   Collection Time: 11/17/20 12:47 PM   Specimen: BLOOD  Result Value Ref Range Status   Specimen Description   Final    BLOOD RIGHT ANTECUBITAL Performed at [facility] [address], [place], [place] [address]    Special Requests   Final    BOTTLES DRAWN AEROBIC AND ANAEROBIC Blood Culture adequate volume Performed at [facility] [address] [place], [place] [address]    Culture  Setup Time   Final    YEAST AEROBIC BOTTLE ONLY CRITICAL RESULT CALLED TO, READ BACK BY AND VERIFIED WITH: TERRY GREEN PHARMD @1002  11/21/20 EB    Culture    Final    YEAST CULTURE REINCUBATED FOR BETTER GROWTH Performed at [facility], [place] [address] [place], [place] [address]    Report Status PENDING  Incomplete  Culture, Urine     Status: Abnormal   Collection Time: 11/17/20  1:40 PM   Specimen: Urine, Random  Result Value Ref Range Status   Specimen Description   Final    URINE, RANDOM Performed at [facility] [address] [place], [place] [address]    Special Requests   Final    NONE Performed at [facility], [place] [address], [place],  [address]    Culture MULTIPLE SPECIES PRESENT, SUGGEST RECOLLECTION (A)  Final   Report Status 11/19/2020 FINAL  Final  MRSA PCR Screening     Status: None   Collection Time: 11/17/20  3:49 PM   Specimen: Nasal Mucosa; Nasopharyngeal  Result Value Ref Range Status   MRSA by PCR NEGATIVE NEGATIVE Final    Comment:        The GeneXpert MRSA Assay (FDA approved for NASAL specimens only), is one component of a comprehensive MRSA colonization surveillance program. It is not intended to diagnose MRSA infection nor to guide or monitor treatment for MRSA infections. Performed at [facility], [place] [address] [place],  [address]          Radiology Studies: No results found.      Scheduled Meds: . apixaban  5 mg Oral BID  . atorvastatin  10 mg Oral QHS  . Chlorhexidine Gluconate Cloth  6 each Topical Daily  . diltiazem  30 mg Oral Q8H  . docusate sodium  100 mg Oral Daily  . insulin aspart  0-5 Units Subcutaneous QHS  . insulin aspart  0-9 Units Subcutaneous TID WC  . lidocaine  1 patch Transdermal Q24H  . mouth rinse  15 mL Mouth Rinse BID  . metoprolol tartrate  25 mg Oral BID  . pantoprazole  40 mg Oral Daily  . sodium bicarbonate  1,300 mg Oral BID  . sodium zirconium cyclosilicate  10 g Oral BID   Continuous Infusions: . [START ON 11/22/2020] anidulafungin    . cefTRIAXone (ROCEPHIN)  IV  2 g  (11/21/20 1312)     LOS: 6 days    Time spent: 35 minutes    Bonnell Public, MD [facility]   If 7PM-7AM, please contact night-coverage [email or web address]  11/21/2020, 6:00 PM

## 2020-11-22 DIAGNOSIS — B377 Candidal sepsis: Secondary | ICD-10-CM | POA: Diagnosis not present

## 2020-11-22 DIAGNOSIS — E875 Hyperkalemia: Secondary | ICD-10-CM

## 2020-11-22 LAB — RENAL FUNCTION PANEL
Albumin: 2 g/dL — ABNORMAL LOW (ref 3.5–5.0)
Anion gap: 9 (ref 5–15)
BUN: 74 mg/dL — ABNORMAL HIGH (ref 8–23)
CO2: 23 mmol/L (ref 22–32)
Calcium: 8.3 mg/dL — ABNORMAL LOW (ref 8.9–10.3)
Chloride: 109 mmol/L (ref 98–111)
Creatinine, Ser: 3.95 mg/dL — ABNORMAL HIGH (ref 0.61–1.24)
GFR, Estimated: 16 mL/min — ABNORMAL LOW (ref >60)
Glucose, Bld: 101 mg/dL — ABNORMAL HIGH (ref 70–99)
Phosphorus: 4.2 mg/dL (ref 2.5–4.6)
Potassium: 5.5 mmol/L — ABNORMAL HIGH (ref 3.5–5.1)
Sodium: 141 mmol/L (ref 135–145)

## 2020-11-22 LAB — GLUCOSE, CAPILLARY
Glucose-Capillary: 118 mg/dL — ABNORMAL HIGH (ref 70–99)
Glucose-Capillary: 102 mg/dL — ABNORMAL HIGH (ref 70–99)
Glucose-Capillary: 127 mg/dL — ABNORMAL HIGH (ref 70–99)
Glucose-Capillary: 106 mg/dL — ABNORMAL HIGH (ref 70–99)

## 2020-11-22 NOTE — Care Management Important Message (Signed)
Important Message  Patient Details IM Letter given to the Patient. Name: Zachary Shannon. MRN: [id number] Date of Birth: [birth year]   Medicare Important Message Given:  Yes     Kerin Salen 11/22/2020, 10:38 AM

## 2020-11-22 NOTE — TOC Progression Note (Signed)
Transition of Care (TOC) - Progression Note    Patient Details  Name: Zachary Shannon. MRN: [id number] Date of Birth: [birth year]  Transition of Care [facility]) CM/SW Contact  Taleya Whitcher, Marjie Skiff, RN Phone Number: 11/22/2020, 2:30 PM  Clinical Narrative:    Spoke with pt and daughter Eustaquio Maize at bedside. SNF bed offers provided along with Medicare.gov list. TOC will follow up with [facility] for SNF choice.   Expected Discharge Plan: [place] Barriers to Discharge: Continued Medical Work up  Expected Discharge Plan and Services Expected Discharge Plan: [place]   Discharge Planning Services: CM Consult Post Acute Care Choice: [place] arrangements for the past 2 months: Single Family Home                  Readmission Risk Interventions No flowsheet data found.

## 2020-11-22 NOTE — Progress Notes (Signed)
PT Cancellation Note  Patient Details Name: Zachary Shannon. MRN: [id number] DOB: 1951/03/02   Cancelled Treatment:    Reason Eval/Treat Not Completed: Fatigue/lethargy limiting ability to participate, recently back to bed, not ready to get up again.   Claretha Cooper 11/22/2020, 1:21 PM [place] Pager [phone] Office [phone]

## 2020-11-22 NOTE — Progress Notes (Signed)
Subjective: No new complaints   Antibiotics:  Anti-infectives (From admission, onward)   Start     Dose/Rate Route Frequency Ordered Stop   11/22/20 1000  anidulafungin (ERAXIS) 100 mg in sodium chloride 0.9 % 100 mL IVPB        100 mg 78 mL/hr over 100 Minutes Intravenous Every 24 hours 11/21/20 1143     11/21/20 2200  cefdinir (OMNICEF) capsule 300 mg  Status:  Discontinued        300 mg Oral Every 12 hours 11/21/20 1815 11/21/20 1816   11/21/20 2200  cefdinir (OMNICEF) capsule 300 mg  Status:  Discontinued        300 mg Oral Daily at bedtime 11/21/20 1816 11/22/20 0846   11/21/20 1230  anidulafungin (ERAXIS) 200 mg in sodium chloride 0.9 % 200 mL IVPB        200 mg 78 mL/hr over 200 Minutes Intravenous  Once 11/21/20 1142 11/21/20 1723   11/17/20 1300  cefTRIAXone (ROCEPHIN) 2 g in sodium chloride 0.9 % 100 mL IVPB  Status:  Discontinued        2 g 200 mL/hr over 30 Minutes Intravenous Daily 11/17/20 1202 11/21/20 1815      Medications: Scheduled Meds: . apixaban  5 mg Oral BID  . atorvastatin  10 mg Oral QHS  . Chlorhexidine Gluconate Cloth  6 each Topical Daily  . diltiazem  30 mg Oral Q8H  . docusate sodium  100 mg Oral Daily  . insulin aspart  0-5 Units Subcutaneous QHS  . insulin aspart  0-9 Units Subcutaneous TID WC  . lidocaine  1 patch Transdermal Q24H  . mouth rinse  15 mL Mouth Rinse BID  . metoprolol tartrate  25 mg Oral BID  . pantoprazole  40 mg Oral Daily  . sodium bicarbonate  1,300 mg Oral BID  . sodium zirconium cyclosilicate  10 g Oral BID   Continuous Infusions: . anidulafungin 100 mg (11/22/20 1031)   PRN Meds:.acetaminophen **OR** acetaminophen, calcium carbonate (dosed in mg elemental calcium), camphor-menthol **AND** hydrOXYzine, docusate sodium, hydrALAZINE, HYDROmorphone, labetalol, nitroGLYCERIN, ondansetron **OR** ondansetron (ZOFRAN) IV, oxyCODONE-acetaminophen, sorbitol, zolpidem    Objective: Weight change:    Intake/Output Summary (Last 24 hours) at 11/22/2020 1440 Last data filed at 11/22/2020 1030 Gross per 24 hour  Intake 240 ml  Output 1450 ml  Net -1210 ml   Blood pressure 118/78, pulse 77, temperature 98.3 F (36.8 C), temperature source Oral, resp. rate 16, height 6' (1.829 m), weight 117.8 kg, SpO2 91 %. Temp:  [98.3 F (36.8 C)-98.7 F (37.1 C)] 98.3 F (36.8 C) (05/02 1202) Pulse Rate:  [77-107] 77 (05/02 1202) Resp:  [16-17] 16 (05/02 1202) BP: (117-130)/(74-85) 118/78 (05/02 1202) SpO2:  [91 %-97 %] 91 % (05/02 1202)  Physical Exam: Physical Exam Constitutional:      Appearance: He is well-developed.  HENT:     Head: Normocephalic and atraumatic.  Eyes:     Conjunctiva/sclera: Conjunctivae normal.  Cardiovascular:     Rate and Rhythm: Normal rate and regular rhythm.     Heart sounds: No murmur heard. No friction rub. No gallop.   Pulmonary:     Effort: Pulmonary effort is normal. No respiratory distress.     Breath sounds: No stridor. No wheezing.  Abdominal:     General: There is no distension.     Palpations: Abdomen is soft.    Musculoskeletal:        General:  Normal range of motion.     Cervical back: Normal range of motion and neck supple.  Skin:    General: Skin is warm and dry.     Findings: No erythema or rash.  Neurological:     Mental Status: He is alert and oriented to person, place, and time.  Psychiatric:        Behavior: Behavior normal.        Thought Content: Thought content normal.        Judgment: Judgment normal.      CBC:    BMET Recent Labs    11/21/20 1258 11/22/20 0545  NA 136 141  K 5.6* 5.5*  CL 106 109  CO2 23 23  GLUCOSE 112* 101*  BUN 74* 74*  CREATININE 3.87* 3.95*  CALCIUM 8.1* 8.3*     Liver Panel  Recent Labs    11/21/20 1258 11/22/20 0545  ALBUMIN 2.0* 2.0*       Sedimentation Rate No results for input(s): ESRSEDRATE in the last 72 hours. C-Reactive Protein No results for input(s): CRP in  the last 72 hours.  Micro Results: Recent Results (from the past 720 hour(s))  SARS CORONAVIRUS 2 (TAT 6-24 HRS) Nasopharyngeal Nasopharyngeal Swab     Status: None   Collection Time: 10/26/20  2:24 PM   Specimen: Nasopharyngeal Swab  Result Value Ref Range Status   SARS Coronavirus 2 NEGATIVE NEGATIVE Final    Comment: (NOTE) SARS-CoV-2 target nucleic acids are NOT DETECTED.  The SARS-CoV-2 RNA is generally detectable in upper and lower respiratory specimens during the acute phase of infection. Negative results do not preclude SARS-CoV-2 infection, do not rule out co-infections with other pathogens, and should not be used as the sole basis for treatment or other patient management decisions. Negative results must be combined with clinical observations, patient history, and epidemiological information. The expected result is Negative.  Fact Sheet for Patients: SugarRoll.be  Fact Sheet for Healthcare Providers: https://www.woods-mathews.com/  This test is not yet approved or cleared by the [place] FDA and  has been authorized for detection and/or diagnosis of SARS-CoV-2 by FDA under an Emergency Use Authorization (EUA). This EUA will remain  in effect (meaning this test can be used) for the duration of the COVID-19 declaration under Se ction 564(b)(1) of the Act, 21 U.S.C. section 360bbb-3(b)(1), unless the authorization is terminated or revoked sooner.  Performed at [facility], [place] [address], [place], [place] [address]   Surgical pcr screen     Status: None   Collection Time: 10/27/20  7:25 AM   Specimen: Nasal Mucosa; Nasal Swab  Result Value Ref Range Status   MRSA, PCR NEGATIVE NEGATIVE Final   Staphylococcus aureus NEGATIVE NEGATIVE Final    Comment: (NOTE) The Xpert SA Assay (FDA approved for NASAL specimens in patients 44 years of age and older), is one component of a comprehensive surveillance program. It is  not intended to diagnose infection nor to guide or monitor treatment. Performed at [facility], [place] [address], [place], [place] [address]   Resp Panel by RT-PCR (Flu A&B, Covid) Nasopharyngeal Swab     Status: None   Collection Time: 11/15/20  6:33 PM   Specimen: Nasopharyngeal Swab; Nasopharyngeal(NP) swabs in vial transport medium  Result Value Ref Range Status   SARS Coronavirus 2 by RT PCR NEGATIVE NEGATIVE Final    Comment: (NOTE) SARS-CoV-2 target nucleic acids are NOT DETECTED.  The SARS-CoV-2 RNA is generally detectable in upper  respiratory specimens during the acute phase of infection. The lowest concentration of SARS-CoV-2 viral copies this assay can detect is 138 copies/mL. A negative result does not preclude SARS-Cov-2 infection and should not be used as the sole basis for treatment or other patient management decisions. A negative result may occur with  improper specimen collection/handling, submission of specimen other than nasopharyngeal swab, presence of viral mutation(s) within the areas targeted by this assay, and inadequate number of viral copies(<138 copies/mL). A negative result must be combined with clinical observations, patient history, and epidemiological information. The expected result is Negative.  Fact Sheet for Patients:  EntrepreneurPulse.com.au  Fact Sheet for Healthcare Providers:  IncredibleEmployment.be  This test is no t yet approved or cleared by the [place] FDA and  has been authorized for detection and/or diagnosis of SARS-CoV-2 by FDA under an Emergency Use Authorization (EUA). This EUA will remain  in effect (meaning this test can be used) for the duration of the COVID-19 declaration under Section 564(b)(1) of the Act, 21 U.S.C.section 360bbb-3(b)(1), unless the authorization is terminated  or revoked sooner.       Influenza A by PCR NEGATIVE NEGATIVE Final   Influenza B  by PCR NEGATIVE NEGATIVE Final    Comment: (NOTE) The Xpert Xpress SARS-CoV-2/FLU/RSV plus assay is intended as an aid in the diagnosis of influenza from Nasopharyngeal swab specimens and should not be used as a sole basis for treatment. Nasal washings and aspirates are unacceptable for Xpert Xpress SARS-CoV-2/FLU/RSV testing.  Fact Sheet for Patients: EntrepreneurPulse.com.au  Fact Sheet for Healthcare Providers: IncredibleEmployment.be  This test is not yet approved or cleared by the [place] FDA and has been authorized for detection and/or diagnosis of SARS-CoV-2 by FDA under an Emergency Use Authorization (EUA). This EUA will remain in effect (meaning this test can be used) for the duration of the COVID-19 declaration under Section 564(b)(1) of the Act, 21 U.S.C. section 360bbb-3(b)(1), unless the authorization is terminated or revoked.  Performed at [facility], [place] [address] [place], [place] [address]   Culture, blood (routine x 2)     Status: Abnormal (Preliminary result)   Collection Time: 11/17/20 12:47 PM   Specimen: BLOOD  Result Value Ref Range Status   Specimen Description   Final    BLOOD RIGHT ANTECUBITAL Performed at [place] [address] [place], [place] [address]    Special Requests   Final    BOTTLES DRAWN AEROBIC AND ANAEROBIC Blood Culture adequate volume Performed at [place] [address] [place], [place] [address]    Culture  Setup Time   Final    YEAST AEROBIC BOTTLE ONLY CRITICAL RESULT CALLED TO, READ BACK BY AND VERIFIED WITH: TERRY GREEN PHARMD @1002  11/21/20 EB Performed at [place] [address] [place], [place] [address]    Culture CANDIDA ALBICANS (A)  Final   Report Status PENDING  Incomplete  Culture, Urine     Status: Abnormal   Collection Time: 11/17/20  1:40 PM   Specimen: Urine, Random  Result Value Ref  Range Status   Specimen Description   Final    URINE, RANDOM Performed at [place] [address] [place], [place] [address]    Special Requests   Final    NONE Performed at [facility], [place] [address], [place], [place] [address]    Culture MULTIPLE SPECIES PRESENT, SUGGEST RECOLLECTION (A)  Final   Report Status  11/19/2020 FINAL  Final  MRSA PCR Screening     Status: None   Collection Time: 11/17/20  3:49 PM   Specimen: Nasal Mucosa; Nasopharyngeal  Result Value Ref Range Status   MRSA by PCR NEGATIVE NEGATIVE Final    Comment:        The GeneXpert MRSA Assay (FDA approved for NASAL specimens only), is one component of a comprehensive MRSA colonization surveillance program. It is not intended to diagnose MRSA infection nor to guide or monitor treatment for MRSA infections. Performed at [facility], [place] [address] [place], [place] [address]   Culture, blood (Routine X 2) w Reflex to ID Panel     Status: None (Preliminary result)   Collection Time: 11/21/20 12:58 PM   Specimen: BLOOD LEFT HAND  Result Value Ref Range Status   Specimen Description   Final    BLOOD LEFT HAND Performed at [place] [address], [place], [place] [address]    Special Requests   Final    BOTTLES DRAWN AEROBIC AND ANAEROBIC Blood Culture adequate volume Performed at [place] [address], [place], [place] [address]    Culture   Final    NO GROWTH < 24 HOURS Performed at [place] [address] [place], [place] [address]    Report Status PENDING  Incomplete  Culture, blood (Routine X 2) w Reflex to ID Panel     Status: None (Preliminary result)   Collection Time: 11/21/20 12:58 PM   Specimen: BLOOD RIGHT HAND  Result Value Ref Range Status   Specimen Description   Final    BLOOD RIGHT HAND Performed at [place] [address]  [place], [place] [address]    Special Requests   Final    BOTTLES DRAWN AEROBIC AND ANAEROBIC Blood Culture adequate volume Performed at [place] [address], [place], [place] [address]    Culture   Final    NO GROWTH < 24 HOURS Performed at [place] [address], [place], [place] [address]    Report Status PENDING  Incomplete    Studies/Results: No results found.    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 is a 70 y.o. male with high-grade bladder cancer status post cystoscopy with a radical cystoprostatectomy and ileal conduit creation pelvic node dissection bilateral inguinal hernia repair laparoscopically ureteral stent placement, now admitted with hematuria concern for sepsis, with blood cultures positive for Candida albicans  1.  Candidemia: Given that this is Candida albicans we can switch over from Eraxis to fluconazole  Repeat blood cultures have been taken  He does need a dilated funduscopic exam to rule out fungal endophthalmitis  Given the fact that he had stents placed recently in his bladder and our concerns that the urine was the source of his bacteremia we may need to give him extended course of fluconazole especially if the stents might be removed in the future.  Would like to see if there is a plan for this with urology.  If on the other hand they are going to be left in place  for protracted period of time with no plans for removal I would favor giving him a 2-week course of fluconazole and follow this with watchful waiting.   LOS: 7 days   Alcide Evener 11/22/2020, 2:40 PM

## 2020-11-22 NOTE — Progress Notes (Signed)
PROGRESS NOTE    Zachary Shannon.  WNU:[id number] DOB: [birth year] DOA: 11/15/2020 PCP: Minna Antis, DO   Chief Complain: Flank/suprapubic pain/confusion  Brief Narrative: Patient is a 70 year old male with history of bladder tumor status post cystoprostatectomy on 4/6 with placement of ileal conduit/ stents, chronic systolic heart failure, paroxysmal A. fib, coronary disease, hyperlipidemia who presented to the emergency department with complaints of flank/suprapubic plan/confusion.  On presentation, was found to have severe AKI with creatinine of more than 6.  Baseline creatinine normal.  Patient was admitted for the management of severe renal failure.  CT stone study did not show any acute finding.  Started on IV antibiotics, IV fluids for possible pyelonephritis.  Nephrology consulted.  Urine culture showed multiple species.  Blood culture has shown Candida albicans, started on Eraxis.  ID following.  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  Presented with creatinine of more than 6.  Started on IV fluids, bicarb drip.  Nephrology, urology was following.  Kidney function improved but trending up again. Nephrology reconsulted today.  Patient slightly looks volume overloaded today.  Maybe he will benefit with IV Lasix, will leave up to nephrology.  Candidemia: One Blood culture has shown Candida, currently on Eraxis.  ID following..  TTE did not show any obvious vegetation.  Repeat blood cultures will be followed,NGTD.  I had a long discussion with Dr. Valetta Close, [facility] ophthalmology, today when I consulted him for a ophthalmoscopy for ruling out  endophthalmitis.  He suggested to follow-up as an outpatient because  only 1 blood culture is positive.  He requested calling back if the repeat blood culture sent on 11/21/2020 comes  to be positive.  Chest pressure: Complain of some chest pressure on 4/27.  Echocardiogram showed normal ejection fraction, no wall motion abnormality.  Troponins were unremarkable.  Cardiology signed off.  VQ scan was done to rule out PE, ruled out.  Back pain: MRI had some mild disc protrusion.  He is also discussed with Dr. Christella Noa, neurosurgery.  No plan for intervention.  Continue supportive care, pain management  Paroxysmal atrial fibrillation: Currently rate is well controlled.  On Eliquis for anticoagulation.  On metoprolol for rate control.  History of systolic heart failure/coronary artery  disease: Currently stable.  History of ischemic coronary artery disease chronic systolic heart failure with ejection fraction of 20 to 25% but improved to 55-60% as per Echo done on 11/18/2020.  Follows with [facility] cardiology in [place]  Hyperkalemia: being treated with Lokelma, bicarb. .  Bladder cancer status post recent cystoprostatectomy: Status post ileal conduit, ureteral stent placement on 10/27/2020.  Urology was following.  There was some concern for Pyelo.  Leukocytosis improving.  Initially on ceftriaxone which has been changed to cefdinir.  Hypotension: Resolved  Confusion: Resolved  Deconditioning/debility: PT/OT recommended SNF on discharge.TOC following        Nutrition Problem: Inadequate oral intake Etiology: poor appetite      DVT prophylaxis:Eliquis Code Status: Full Family Communication: Daughter at bedside Status is: Inpatient  Remains inpatient appropriate because:Inpatient level of care appropriate due to severity of illness   Dispo: T Che patient is from: Home  Anticipated d/c is to: SNF              Patient currently is not medically stable to d/c.    Difficult to place patient No     Consultants: ID, cardiology, nephrology, urology  Procedures: None  Antimicrobials:  Anti-infectives (From admission, onward)   Start     Dose/Rate Route Frequency Ordered Stop   11/22/20 1000  anidulafungin (ERAXIS) 100 mg in sodium chloride 0.9 % 100 mL IVPB        100 mg 78 mL/hr over 100 Minutes Intravenous Every 24 hours 11/21/20 1143     11/21/20 2200  cefdinir (OMNICEF) capsule 300 mg  Status:  Discontinued        300 mg Oral Every 12 hours 11/21/20 1815 11/21/20 1816   11/21/20 2200  cefdinir (OMNICEF) capsule 300 mg  Status:  Discontinued        300 mg Oral Daily at bedtime 11/21/20 1816 11/22/20 0846   11/21/20 1230  anidulafungin (ERAXIS) 200 mg in sodium chloride 0.9 % 200 mL IVPB        200 mg 78 mL/hr over 200 Minutes Intravenous  Once 11/21/20 1142 11/21/20 1723   11/17/20 1300  cefTRIAXone (ROCEPHIN) 2 g in sodium chloride 0.9 % 100 mL IVPB  Status:  Discontinued        2 g 200 mL/hr over 30 Minutes Intravenous Daily 11/17/20 1202 11/21/20 1815      Subjective: Patient seen and examined the bedside this morning.  Hemodynamically stable.  Comfortable.  Denies any new complaints today.  Daughter at the bedside.  Objective: Vitals:   11/21/20 1401 11/21/20 2149 11/22/20 0618 11/22/20 1202  BP: 113/79 130/85 117/74 118/78  Pulse: 75 (!) 107 78 77  Resp: 18 17 17 16   Temp: 98.1 F (36.7 C) 98.3 F (36.8 C) 98.7 F (37.1 C) 98.3 F (36.8 C)  TempSrc: Oral Oral Oral Oral  SpO2: 94% 97% 91% 91%  Weight:      Height:        Intake/Output Summary (Last 24 hours) at 11/22/2020 1437 Last data filed at 11/22/2020 1030 Gross per 24 hour  Intake 240 ml  Output 1450 ml  Net -1210 ml   Filed Weights   11/17/20 1546 11/18/20 0500 11/19/20 0447  Weight: 117.9 kg 117.1 kg 117.8 kg    Examination:  General exam: Appears calm and comfortable ,Not in distress,average built HEENT:PERRL,Oral mucosa moist, Ear/Nose normal on gross  exam Respiratory system: Bilateral equal air entry, normal vesicular breath sounds, no wheezes or crackles  Cardiovascular system: S1 & S2 heard, RRR. No JVD, murmurs, rubs, gallops or clicks. No pedal edema. Gastrointestinal system: Abdomen is nondistended, soft and nontender. No organomegaly or masses felt. Normal bowel sounds heard.RLQ urostomy Central nervous system: Alert and oriented. No focal neurological deficits. Extremities: Trace edema on bilateral lower extremities edema, no clubbing ,no cyanosis Skin: No rashes, lesions or ulcers,no icterus ,no pallor    Data Reviewed: I have personally reviewed following labs and imaging studies  CBC: Recent Labs  Lab 11/17/20 0449 11/18/20 0245 11/19/20 0235 11/20/20 0239 11/21/20 1258  WBC 12.1* 14.7* 13.5* 13.5* 14.1*  NEUTROABS  --   --   --   --  11.2*  HGB 10.7* 11.1* 11.3* 11.1* 10.7*  HCT 33.1* 34.6* 35.1* 35.3* 33.3*  MCV 98.8 99.7 100.6* 102.3* 100.9*  PLT 341 345 340 349 829   Basic Metabolic Panel: Recent Labs  Lab 11/17/20 0449 11/18/20 0245  11/19/20 0235 11/20/20 0239 11/21/20 1258 11/22/20 0545  NA 134* 132* 135 138 136 141  K 5.3* 5.1 5.0 4.9 5.6* 5.5*  CL 100 101 103 105 106 109  CO2 23 21* 21* 23 23 23   GLUCOSE 110* 105* 103* 105* 112* 101*  BUN 92* 90* 80* 73* 74* 74*  CREATININE 4.34* 4.17* 3.46* 3.11* 3.87* 3.95*  CALCIUM 8.1* 8.0* 8.0* 8.2* 8.1* 8.3*  MG  --   --   --   --  2.3  --   PHOS 4.5 4.5  --   --  4.1 4.2   GFR: Estimated Creatinine Clearance: 23.1 mL/min (A) (by C-G formula based on SCr of 3.95 mg/dL (H)). Liver Function Tests: Recent Labs  Lab 11/17/20 0449 11/18/20 0245 11/21/20 1258 11/22/20 0545  ALBUMIN 2.5* 2.6* 2.0* 2.0*   No results for input(s): LIPASE, AMYLASE in the last 168 hours. No results for input(s): AMMONIA in the last 168 hours. Coagulation Profile: No results for input(s): INR, PROTIME in the last 168 hours. Cardiac Enzymes: Recent Labs  Lab  11/16/20 1326  CKTOTAL 82   BNP (last 3 results) No results for input(s): PROBNP in the last 8760 hours. HbA1C: No results for input(s): HGBA1C in the last 72 hours. CBG: Recent Labs  Lab 11/21/20 1157 11/21/20 1614 11/21/20 2144 11/22/20 0733 11/22/20 1159  GLUCAP 107* 147* 118* 118* 102*   Lipid Profile: No results for input(s): CHOL, HDL, LDLCALC, TRIG, CHOLHDL, LDLDIRECT in the last 72 hours. Thyroid Function Tests: No results for input(s): TSH, T4TOTAL, FREET4, T3FREE, THYROIDAB in the last 72 hours. Anemia Panel: No results for input(s): VITAMINB12, FOLATE, FERRITIN, TIBC, IRON, RETICCTPCT in the last 72 hours. Sepsis Labs: No results for input(s): PROCALCITON, LATICACIDVEN in the last 168 hours.  Recent Results (from the past 240 hour(s))  Resp Panel by RT-PCR (Flu A&B, Covid) Nasopharyngeal Swab     Status: None   Collection Time: 11/15/20  6:33 PM   Specimen: Nasopharyngeal Swab; Nasopharyngeal(NP) swabs in vial transport medium  Result Value Ref Range Status   SARS Coronavirus 2 by RT PCR NEGATIVE NEGATIVE Final    Comment: (NOTE) SARS-CoV-2 target nucleic acids are NOT DETECTED.  The SARS-CoV-2 RNA is generally detectable in upper respiratory specimens during the acute phase of infection. The lowest concentration of SARS-CoV-2 viral copies this assay can detect is 138 copies/mL. A negative result does not preclude SARS-Cov-2 infection and should not be used as the sole basis for treatment or other patient management decisions. A negative result may occur with  improper specimen collection/handling, submission of specimen other than nasopharyngeal swab, presence of viral mutation(s) within the areas targeted by this assay, and inadequate number of viral copies(<138 copies/mL). A negative result must be combined with clinical observations, patient history, and epidemiological information. The expected result is Negative.  Fact Sheet for Patients:   EntrepreneurPulse.com.au  Fact Sheet for Healthcare Providers:  IncredibleEmployment.be  This test is no t yet approved or cleared by the [place] FDA and  has been authorized for detection and/or diagnosis of SARS-CoV-2 by FDA under an Emergency Use Authorization (EUA). This EUA will remain  in effect (meaning this test can be used) for the duration of the COVID-19 declaration under Section 564(b)(1) of the Act, 21 U.S.C.section 360bbb-3(b)(1), unless the authorization is terminated  or revoked sooner.       Influenza A by PCR NEGATIVE NEGATIVE Final   Influenza B by PCR NEGATIVE NEGATIVE Final    Comment: (  NOTE) The Xpert Xpress SARS-CoV-2/FLU/RSV plus assay is intended as an aid in the diagnosis of influenza from Nasopharyngeal swab specimens and should not be used as a sole basis for treatment. Nasal washings and aspirates are unacceptable for Xpert Xpress SARS-CoV-2/FLU/RSV testing.  Fact Sheet for Patients: EntrepreneurPulse.com.au  Fact Sheet for Healthcare Providers: IncredibleEmployment.be  This test is not yet approved or cleared by the [place] FDA and has been authorized for detection and/or diagnosis of SARS-CoV-2 by FDA under an Emergency Use Authorization (EUA). This EUA will remain in effect (meaning this test can be used) for the duration of the COVID-19 declaration under Section 564(b)(1) of the Act, 21 U.S.C. section 360bbb-3(b)(1), unless the authorization is terminated or revoked.  Performed at [facility], [place] [address] [place], [place] [address]   Culture, blood (routine x 2)     Status: Abnormal (Preliminary result)   Collection Time: 11/17/20 12:47 PM   Specimen: BLOOD  Result Value Ref Range Status   Specimen Description   Final    BLOOD RIGHT ANTECUBITAL Performed at [place] [address] [place], [place]  [address]    Special Requests   Final    BOTTLES DRAWN AEROBIC AND ANAEROBIC Blood Culture adequate volume Performed at [place] [address], [place], [place] [address]    Culture  Setup Time   Final    YEAST AEROBIC BOTTLE ONLY CRITICAL RESULT CALLED TO, READ BACK BY AND VERIFIED WITH: TERRY GREEN PHARMD @1002  11/21/20 EB Performed at [place] [address] [place], [place] [address]    Culture CANDIDA ALBICANS (A)  Final   Report Status PENDING  Incomplete  Culture, Urine     Status: Abnormal   Collection Time: 11/17/20  1:40 PM   Specimen: Urine, Random  Result Value Ref Range Status   Specimen Description   Final    URINE, RANDOM Performed at [place] [address], [place], [place] [address]    Special Requests   Final    NONE Performed at [facility], [place] [address], [place], [place] [address]    Culture MULTIPLE SPECIES PRESENT, SUGGEST RECOLLECTION (A)  Final   Report Status 11/19/2020 FINAL  Final  MRSA PCR Screening     Status: None   Collection Time: 11/17/20  3:49 PM   Specimen: Nasal Mucosa; Nasopharyngeal  Result Value Ref Range Status   MRSA by PCR NEGATIVE NEGATIVE Final    Comment:        The GeneXpert MRSA Assay (FDA approved for NASAL specimens only), is one component of a comprehensive MRSA colonization surveillance program. It is not intended to diagnose MRSA infection nor to guide or monitor treatment for MRSA infections. Performed at [facility], [place] [address] [place], [place] [address]   Culture, blood (Routine X 2) w Reflex to ID Panel     Status: None (Preliminary result)   Collection Time: 11/21/20 12:58 PM   Specimen: BLOOD LEFT HAND  Result Value Ref Range Status   Specimen Description   Final    BLOOD LEFT HAND Performed at [place] [address] [place], [place] [address]    Special Requests   Final     BOTTLES DRAWN AEROBIC AND ANAEROBIC Blood Culture adequate volume Performed at [place] [address] [place],  [address]    Culture   Final    NO GROWTH <  24 HOURS Performed at [facility], [place] [address], [place], [place] [address]    Report Status PENDING  Incomplete  Culture, blood (Routine X 2) w Reflex to ID Panel     Status: None (Preliminary result)   Collection Time: 11/21/20 12:58 PM   Specimen: BLOOD RIGHT HAND  Result Value Ref Range Status   Specimen Description   Final    BLOOD RIGHT HAND Performed at [place] [address], [place], [place] [address]    Special Requests   Final    BOTTLES DRAWN AEROBIC AND ANAEROBIC Blood Culture adequate volume Performed at [place] [address] [place], [place] [address]    Culture   Final    NO GROWTH < 24 HOURS Performed at [place] [address] [place], [place] [address]    Report Status PENDING  Incomplete         Radiology Studies: No results found.      Scheduled Meds: . apixaban  5 mg Oral BID  . atorvastatin  10 mg Oral QHS  . Chlorhexidine Gluconate Cloth  6 each Topical Daily  . diltiazem  30 mg Oral Q8H  . docusate sodium  100 mg Oral Daily  . insulin aspart  0-5 Units Subcutaneous QHS  . insulin aspart  0-9 Units Subcutaneous TID WC  . lidocaine  1 patch Transdermal Q24H  . mouth rinse  15 mL Mouth Rinse BID  . metoprolol tartrate  25 mg Oral BID  . pantoprazole  40 mg Oral Daily  . sodium bicarbonate  1,300 mg Oral BID  . sodium zirconium cyclosilicate  10 g Oral BID   Continuous Infusions: . anidulafungin 100 mg (11/22/20 1031)     LOS: 7 days    Time spent:35 mins. More than 50% of that time was spent in counseling and/or coordination of care.      Shelly Coss, MD [facility] P5/08/2020, 2:37 PM

## 2020-11-23 ENCOUNTER — Inpatient Hospital Stay (HOSPITAL_COMMUNITY): Payer: Medicare Other

## 2020-11-23 DIAGNOSIS — B377 Candidal sepsis: Secondary | ICD-10-CM | POA: Diagnosis not present

## 2020-11-23 LAB — GLUCOSE, CAPILLARY
Glucose-Capillary: 87 mg/dL (ref 70–99)
Glucose-Capillary: 133 mg/dL — ABNORMAL HIGH (ref 70–99)
Glucose-Capillary: 115 mg/dL — ABNORMAL HIGH (ref 70–99)
Glucose-Capillary: 84 mg/dL (ref 70–99)

## 2020-11-23 LAB — BASIC METABOLIC PANEL
Anion gap: 9 (ref 5–15)
BUN: 67 mg/dL — ABNORMAL HIGH (ref 8–23)
CO2: 22 mmol/L (ref 22–32)
Calcium: 8 mg/dL — ABNORMAL LOW (ref 8.9–10.3)
Chloride: 108 mmol/L (ref 98–111)
Creatinine, Ser: 3.78 mg/dL — ABNORMAL HIGH (ref 0.61–1.24)
GFR, Estimated: 16 mL/min — ABNORMAL LOW (ref >60)
Glucose, Bld: 95 mg/dL (ref 70–99)
Potassium: 5.1 mmol/L (ref 3.5–5.1)
Sodium: 139 mmol/L (ref 135–145)

## 2020-11-23 LAB — CBC WITH DIFFERENTIAL/PLATELET
Abs Immature Granulocytes: 0.91 10*3/uL — ABNORMAL HIGH (ref 0.00–0.07)
Basophils Absolute: 0.1 10*3/uL (ref 0.0–0.1)
Basophils Relative: 1 %
Eosinophils Absolute: 0.4 10*3/uL (ref 0.0–0.5)
Eosinophils Relative: 3 %
HCT: 33.7 % — ABNORMAL LOW (ref 39.0–52.0)
Hemoglobin: 10.4 g/dL — ABNORMAL LOW (ref 13.0–17.0)
Immature Granulocytes: 7 %
Lymphocytes Relative: 6 %
Lymphs Abs: 0.8 10*3/uL (ref 0.7–4.0)
MCH: 31.8 pg (ref 26.0–34.0)
MCHC: 30.9 g/dL (ref 30.0–36.0)
MCV: 103.1 fL — ABNORMAL HIGH (ref 80.0–100.0)
Monocytes Absolute: 1.2 10*3/uL — ABNORMAL HIGH (ref 0.1–1.0)
Monocytes Relative: 9 %
Neutro Abs: 10 10*3/uL — ABNORMAL HIGH (ref 1.7–7.7)
Neutrophils Relative %: 74 %
Platelets: 325 10*3/uL (ref 150–400)
RBC: 3.27 MIL/uL — ABNORMAL LOW (ref 4.22–5.81)
RDW: 15.4 % (ref 11.5–15.5)
WBC: 13.4 10*3/uL — ABNORMAL HIGH (ref 4.0–10.5)
nRBC: 0 % (ref 0.0–0.2)

## 2020-11-23 LAB — URINALYSIS, ROUTINE W REFLEX MICROSCOPIC
Bilirubin Urine: NEGATIVE
Glucose, UA: NEGATIVE mg/dL
Ketones, ur: NEGATIVE mg/dL
Nitrite: POSITIVE — AB
Protein, ur: 100 mg/dL — AB
RBC / HPF: >50 RBC/hpf — ABNORMAL HIGH (ref 0–5)
Specific Gravity, Urine: 1.01 (ref 1.005–1.030)
WBC, UA: >50 WBC/hpf — ABNORMAL HIGH (ref 0–5)
pH: 7 (ref 5.0–8.0)

## 2020-11-23 LAB — CREATININE, URINE, RANDOM: Creatinine, Urine: 75.18 mg/dL

## 2020-11-23 LAB — SODIUM, URINE, RANDOM: Sodium, Ur: 40 mmol/L

## 2020-11-23 MED ORDER — FLUCONAZOLE 100 MG PO TABS
400.0000 mg | ORAL_TABLET | Freq: Every day | ORAL | Status: DC
  Administered 2020-11-24 – 2020-11-25 (×2): 400 mg via ORAL
  Filled 2020-11-23 (×2): qty 4

## 2020-11-23 MED ORDER — FUROSEMIDE 10 MG/ML IJ SOLN
80.0000 mg | Freq: Two times a day (BID) | INTRAMUSCULAR | Status: DC
  Administered 2020-11-23 – 2020-11-25 (×6): 80 mg via INTRAVENOUS
  Filled 2020-11-23 (×6): qty 8

## 2020-11-23 MED ORDER — ADULT MULTIVITAMIN W/MINERALS CH
1.0000 | ORAL_TABLET | Freq: Every day | ORAL | Status: DC
  Administered 2020-11-23 – 2020-11-25 (×3): 1 via ORAL
  Filled 2020-11-23 (×3): qty 1

## 2020-11-23 NOTE — Progress Notes (Addendum)
[facility] Progress Note  Subjective: pt seen in room, not feeling well, nothing specific.    Vitals:   11/22/20 0618 11/22/20 1202 11/22/20 2123 11/23/20 0519  BP: 117/74 118/78 (!) 152/86 (!) 147/88  Pulse: 78 77 78 94  Resp: 17 16    Temp: 98.7 F (37.1 C) 98.3 F (36.8 C) 99 F (37.2 C) 98 F (36.7 C)  TempSrc: Oral Oral Oral Oral  SpO2: 91% 91% 94% 92%  Weight:    119.1 kg  Height:        Exam: General: obese man sleeping peacefully Heart: irreg irreg on moniotor Lungs: normal WOB, clear to bases, on RA Extremities: 2+ LE and 1+ UE edema bilat GU: clear dark brownish urine in foley bag which is attached to urostomy      ECHO 11/18/20- LVEF 55-60%, no wma's. RV fxn slightly reduced, RV mildly enlarged, moderate pulm htn.  LA/ RA mod to severely dilated.     CXR 4/27 - IMPRESSION: Mild cardiomegaly. No edema or airspace opacity. Aortic Atherosclerosis (ICD10-I70.0).   CT renal stone study 4/25 - Adrenals/Urinary Tract: Normal adrenal glands. Bilateral ureteric stents again identified. The left-sided stent originates in an extrarenal pelvis. No renal calculi or hydronephrosis. Cystectomy and ileal conduit creation. 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   IV abx here :sp IV rocephin x 5 d and cefdinir po x1. on anidulafungin IV now  Assessment/ Plan: 1. AKI - baseline creatinine 1.0- 1.13 from dec 2021 and early April 2022. Creat 1.1 at time of cystectomy and 1.09 at dc 4/11. This admit 4/25 pt presented w/ AKI creat 6.4, we felt this was likely due to hypovolemia + ACEi. Urology saw pt and noted no hydro and good bilat ureteral stent placement by imaging. Creat improved w/ IVF"s and holding ACEi down to 3.1 on 4/30. Creat now has ^'d back up to 3.9 yest and 3.7 today. Asked to see again for rising creat. BP's normal, VS stable, no nephrotoxins, getting antifungals IV now. Pt looks edematous but w/o SOB, rales, etc, so does not need IVF"s. Will get UA,  urine lytes and renal US. Would recommend urology take a look as well to be sure there is no problem w/ obstruction. Will follow.    2. Bladder cancer-  status post cystoprostatectomy 10/27/2020 with ileal conduit 3. BP/ volume - bp's wnl, getting BB and CCB for afib. +volume excess on exam today. Not SOB. Will repeat CXR. Will give lasix 80 bid IV.  4. A. Fib- on apixaban, BB and diltiazem. BP's wnl.  5. Hyperkalemia - mild, renal diet 5. Metabolic acidosis- likely related to #1 and #2, resolved with bicarb supplementation 6. H/o systolic CHF / CAD    Rob Winson Eichorn 11/23/2020, 9:00 AM   Recent Labs  Lab 11/21/20 1258 11/22/20 0545 11/23/20 0529  K 5.6* 5.5* 5.1  BUN 74* 74* 67*  CREATININE 3.87* 3.95* 3.78*  CALCIUM 8.1* 8.3* 8.0*  PHOS 4.1 4.2  --   HGB 10.7*  --  10.4*   Inpatient medications: . apixaban  5 mg Oral BID  . atorvastatin  10 mg Oral QHS  . Chlorhexidine Gluconate Cloth  6 each Topical Daily  . diltiazem  30 mg Oral Q8H  . docusate sodium  100 mg Oral Daily  . insulin aspart  0-5 Units Subcutaneous QHS  . insulin aspart  0-9 Units Subcutaneous TID WC  . lidocaine  1 patch Transdermal Q24H  . mouth rinse  15  mL Mouth Rinse BID  . metoprolol tartrate  25 mg Oral BID  . pantoprazole  40 mg Oral Daily  . sodium bicarbonate  1,300 mg Oral BID   . anidulafungin 100 mg (11/22/20 1031)   acetaminophen **OR** acetaminophen, calcium carbonate (dosed in mg elemental calcium), camphor-menthol **AND** hydrOXYzine, docusate sodium, hydrALAZINE, HYDROmorphone, labetalol, nitroGLYCERIN, ondansetron **OR** ondansetron (ZOFRAN) IV, oxyCODONE-acetaminophen, sorbitol, zolpidem

## 2020-11-23 NOTE — Progress Notes (Addendum)
PROGRESS NOTE    Zachary Shannon.  FAO:[id number] DOB: July 29, 1950 DOA: 11/15/2020 PCP: Minna Antis, DO   Chief Complain: Flank/suprapubic pain/confusion  Brief Narrative: Patient is a 70 year old male with history of bladder tumor status post cystoprostatectomy on 4/6 with placement of ileal conduit/ stents, chronic systolic heart failure, paroxysmal A. fib, coronary disease, hyperlipidemia who presented to the emergency department with complaints of flank/suprapubic plan/confusion.  On presentation, was found to have severe AKI with creatinine of more than 6.  Baseline creatinine normal.  Patient was admitted for the management of severe renal failure.  CT stone study did not show any acute finding.  Started on IV antibiotics, IV fluids for possible pyelonephritis.  Nephrology consulted.  Urine culture showed multiple species.  Blood culture has shown Candida albicans, started on Eraxis.  ID following.  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  Presented with creatinine of more than 6.  Started on IV fluids, bicarb drip.  Nephrology, urology was following.  Kidney function improved but trended up again. Nephrology reconsulted .  Patient slightly looks volume overloaded today.  Started on IV lasix  Candida sepsis:Present on admission. One Blood culture has shown Candida, currently on Eraxis.  ID following. TTE did not show any obvious vegetation.  Repeat blood cultures will be followed,NGTD.  I had a long discussion with Dr. Valetta Close, [facility] ophthalmology, today when I consulted him for a ophthalmoscopy for ruling out endophthalmitis.  He suggested to follow-up as an outpatient because  only 1 blood culture is positive.  He requested  calling back if the repeat blood culture sent on 11/21/2020 comes  to be positive.  Chest pressure: Resolved.  Complain of some chest pressure on 4/27.  Echocardiogram showed normal ejection fraction, no wall motion abnormality.  Troponins were unremarkable.  Cardiology signed off.  VQ scan was done to rule out PE, ruled out.  Back pain: MRI had some mild disc protrusion.  He is also discussed with Dr. Christella Noa, neurosurgery.  No plan for intervention.  Continue supportive care.  Paroxysmal atrial fibrillation: Currently rate is well controlled.  On Eliquis for anticoagulation.  On metoprolol for rate control.  History of systolic heart failure/coronary artery  disease: Currently stable.  History of ischemic coronary artery disease chronic systolic heart failure with ejection fraction of 20 to 25% but improved to 55-60% as per Echo done on 11/18/2020.  Follows with [facility] cardiology in [place]  Hyperkalemia: Resolved. treated with Lokelma, bicarb. .  Bladder cancer status post recent cystoprostatectomy: Status post ileal conduit, ureteral stent placement on 10/27/2020 by Dr Tresa Moore.  Urology was following.  There was some concern for Pyelo.  Leukocytosis improving.  Initially on ceftriaxone which has been changed to cefdinir. Urology will be reconsulted because creatinine has plateaued in the range of 3.  Hypotension: Resolved  Confusion: Resolved  Deconditioning/debility: PT/OT recommended SNF on discharge.TOC following        Nutrition Problem: Inadequate oral intake Etiology: poor appetite      DVT prophylaxis:Eliquis Code Status: Full Family Communication: Daughter on phon eon 11/23/20 Status is: Inpatient  Remains inpatient appropriate because:Inpatient level of care appropriate due to severity of illness   Dispo: T Che patient is from: Home  Anticipated d/c is to: SNF              Patient currently is not medically stable to d/c.   Difficult to place  patient No     Consultants: ID, cardiology, nephrology, urology  Procedures: None  Antimicrobials:  Anti-infectives (From admission, onward)   Start     Dose/Rate Route Frequency Ordered Stop   11/22/20 1000  anidulafungin (ERAXIS) 100 mg in sodium chloride 0.9 % 100 mL IVPB        100 mg 78 mL/hr over 100 Minutes Intravenous Every 24 hours 11/21/20 1143     11/21/20 2200  cefdinir (OMNICEF) capsule 300 mg  Status:  Discontinued        300 mg Oral Every 12 hours 11/21/20 1815 11/21/20 1816   11/21/20 2200  cefdinir (OMNICEF) capsule 300 mg  Status:  Discontinued        300 mg Oral Daily at bedtime 11/21/20 1816 11/22/20 0846   11/21/20 1230  anidulafungin (ERAXIS) 200 mg in sodium chloride 0.9 % 200 mL IVPB        200 mg 78 mL/hr over 200 Minutes Intravenous  Once 11/21/20 1142 11/21/20 1723   11/17/20 1300  cefTRIAXone (ROCEPHIN) 2 g in sodium chloride 0.9 % 100 mL IVPB  Status:  Discontinued        2 g 200 mL/hr over 30 Minutes Intravenous Daily 11/17/20 1202 11/21/20 1815      Subjective: Patient seen and examined the bedside this morning.  He denies any complaints to me today.  He said he slept well, denies any significant pain.  Objective: Vitals:   11/22/20 0618 11/22/20 1202 11/22/20 2123 11/23/20 0519  BP: 117/74 118/78 (!) 152/86 (!) 147/88  Pulse: 78 77 78 94  Resp: 17 16    Temp: 98.7 F (37.1 C) 98.3 F (36.8 C) 99 F (37.2 C) 98 F (36.7 C)  TempSrc: Oral Oral Oral Oral  SpO2: 91% 91% 94% 92%  Weight:    119.1 kg  Height:        Intake/Output Summary (Last 24 hours) at 11/23/2020 0829 Last data filed at 11/23/2020 0522 Gross per 24 hour  Intake 743 ml  Output 1600 ml  Net -857 ml   Filed Weights   11/18/20 0500 11/19/20 0447 11/23/20 0519  Weight: 117.1 kg 117.8 kg 119.1 kg    Examination:  HEENT: PERRL Respiratory system:  no wheezes or crackles  Cardiovascular system: S1 & S2 heard, RRR.  Gastrointestinal system: Abdomen is nondistended,  soft and nontender.  Right lower quadrant urostomy Central nervous system: Alert and oriented Extremities: Trace bilateral lower extremity edema, no clubbing ,no cyanosis Skin: No rashes, no ulcers,no icterus    Data Reviewed: I have personally reviewed following labs and imaging studies  CBC: Recent Labs  Lab 11/18/20 0245 11/19/20 0235 11/20/20 0239 11/21/20 1258 11/23/20 0529  WBC 14.7* 13.5* 13.5* 14.1* 13.4*  NEUTROABS  --   --   --  11.2* 10.0*  HGB 11.1* 11.3* 11.1* 10.7* 10.4*  HCT 34.6* 35.1* 35.3* 33.3* 33.7*  MCV 99.7 100.6* 102.3* 100.9* 103.1*  PLT 345 340 349 337 627   Basic Metabolic Panel: Recent Labs  Lab 11/17/20 0449 11/18/20 0245 11/19/20 0235 11/20/20 0239 11/21/20 1258 11/22/20 0545 11/23/20 0529  NA 134* 132* 135 138 136 141 139  K 5.3* 5.1 5.0 4.9 5.6* 5.5* 5.1  CL 100 101 103 105 106 109 108  CO2 23 21* 21* 23 23  23 22  GLUCOSE 110* 105* 103* 105* 112* 101* 95  BUN 92* 90* 80* 73* 74* 74* 67*  CREATININE 4.34* 4.17* 3.46* 3.11* 3.87* 3.95* 3.78*  CALCIUM 8.1* 8.0* 8.0* 8.2* 8.1* 8.3* 8.0*  MG  --   --   --   --  2.3  --   --   PHOS 4.5 4.5  --   --  4.1 4.2  --    GFR: Estimated Creatinine Clearance: 24.2 mL/min (A) (by C-G formula based on SCr of 3.78 mg/dL (H)). Liver Function Tests: Recent Labs  Lab 11/17/20 0449 11/18/20 0245 11/21/20 1258 11/22/20 0545  ALBUMIN 2.5* 2.6* 2.0* 2.0*   No results for input(s): LIPASE, AMYLASE in the last 168 hours. No results for input(s): AMMONIA in the last 168 hours. Coagulation Profile: No results for input(s): INR, PROTIME in the last 168 hours. Cardiac Enzymes: Recent Labs  Lab 11/16/20 1326  CKTOTAL 82   BNP (last 3 results) No results for input(s): PROBNP in the last 8760 hours. HbA1C: No results for input(s): HGBA1C in the last 72 hours. CBG: Recent Labs  Lab 11/22/20 0733 11/22/20 1159 11/22/20 1552 11/22/20 2122 11/23/20 0726  GLUCAP 118* 102* 127* 106* 87   Lipid  Profile: No results for input(s): CHOL, HDL, LDLCALC, TRIG, CHOLHDL, LDLDIRECT in the last 72 hours. Thyroid Function Tests: No results for input(s): TSH, T4TOTAL, FREET4, T3FREE, THYROIDAB in the last 72 hours. Anemia Panel: No results for input(s): VITAMINB12, FOLATE, FERRITIN, TIBC, IRON, RETICCTPCT in the last 72 hours. Sepsis Labs: No results for input(s): PROCALCITON, LATICACIDVEN in the last 168 hours.  Recent Results (from the past 240 hour(s))  Resp Panel by RT-PCR (Flu A&B, Covid) Nasopharyngeal Swab     Status: None   Collection Time: 11/15/20  6:33 PM   Specimen: Nasopharyngeal Swab; Nasopharyngeal(NP) swabs in vial transport medium  Result Value Ref Range Status   SARS Coronavirus 2 by RT PCR NEGATIVE NEGATIVE Final    Comment: (NOTE) SARS-CoV-2 target nucleic acids are NOT DETECTED.  The SARS-CoV-2 RNA is generally detectable in upper respiratory specimens during the acute phase of infection. The lowest concentration of SARS-CoV-2 viral copies this assay can detect is 138 copies/mL. A negative result does not preclude SARS-Cov-2 infection and should not be used as the sole basis for treatment or other patient management decisions. A negative result may occur with  improper specimen collection/handling, submission of specimen other than nasopharyngeal swab, presence of viral mutation(s) within the areas targeted by this assay, and inadequate number of viral copies(<138 copies/mL). A negative result must be combined with clinical observations, patient history, and epidemiological information. The expected result is Negative.  Fact Sheet for Patients:  EntrepreneurPulse.com.au  Fact Sheet for Healthcare Providers:  IncredibleEmployment.be  This test is no t yet approved or cleared by the [place] FDA and  has been authorized for detection and/or diagnosis of SARS-CoV-2 by FDA under an Emergency Use Authorization (EUA). This EUA  will remain  in effect (meaning this test can be used) for the duration of the COVID-19 declaration under Section 564(b)(1) of the Act, 21 U.S.C.section 360bbb-3(b)(1), unless the authorization is terminated  or revoked sooner.       Influenza A by PCR NEGATIVE NEGATIVE Final   Influenza B by PCR NEGATIVE NEGATIVE Final    Comment: (NOTE) The Xpert Xpress SARS-CoV-2/FLU/RSV plus assay is intended as an aid in the diagnosis of influenza from Nasopharyngeal swab specimens and should not be used as a  sole basis for treatment. Nasal washings and aspirates are unacceptable for Xpert Xpress SARS-CoV-2/FLU/RSV testing.  Fact Sheet for Patients: EntrepreneurPulse.com.au  Fact Sheet for Healthcare Providers: IncredibleEmployment.be  This test is not yet approved or cleared by the [place] FDA and has been authorized for detection and/or diagnosis of SARS-CoV-2 by FDA under an Emergency Use Authorization (EUA). This EUA will remain in effect (meaning this test can be used) for the duration of the COVID-19 declaration under Section 564(b)(1) of the Act, 21 U.S.C. section 360bbb-3(b)(1), unless the authorization is terminated or revoked.  Performed at [facility], [place] [address], [place], [place] [address]   Culture, blood (routine x 2)     Status: Abnormal (Preliminary result)   Collection Time: 11/17/20 12:47 PM   Specimen: BLOOD  Result Value Ref Range Status   Specimen Description   Final    BLOOD RIGHT ANTECUBITAL Performed at [facility] [address], [place], [place] [address]    Special Requests   Final    BOTTLES DRAWN AEROBIC AND ANAEROBIC Blood Culture adequate volume Performed at [facility] [address], [place], [place] [address]    Culture  Setup Time   Final    YEAST AEROBIC BOTTLE ONLY CRITICAL RESULT CALLED TO, READ BACK BY AND VERIFIED WITH: TERRY GREEN  PHARMD @1002  11/21/20 EB Performed at [facility] [address], [place], [place] [address]    Culture CANDIDA ALBICANS (A)  Final   Report Status PENDING  Incomplete  Culture, Urine     Status: Abnormal   Collection Time: 11/17/20  1:40 PM   Specimen: Urine, Random  Result Value Ref Range Status   Specimen Description   Final    URINE, RANDOM Performed at [facility] [address], [place], [place] [address]    Special Requests   Final    NONE Performed at [facility], [place] [address] [place], [place] [address]    Culture MULTIPLE SPECIES PRESENT, SUGGEST RECOLLECTION (A)  Final   Report Status 11/19/2020 FINAL  Final  MRSA PCR Screening     Status: None   Collection Time: 11/17/20  3:49 PM   Specimen: Nasal Mucosa; Nasopharyngeal  Result Value Ref Range Status   MRSA by PCR NEGATIVE NEGATIVE Final    Comment:        The GeneXpert MRSA Assay (FDA approved for NASAL specimens only), is one component of a comprehensive MRSA colonization surveillance program. It is not intended to diagnose MRSA infection nor to guide or monitor treatment for MRSA infections. Performed at [facility], [place] [address], [place], [place] [address]   Culture, blood (Routine X 2) w Reflex to ID Panel     Status: None (Preliminary result)   Collection Time: 11/21/20 12:58 PM   Specimen: BLOOD LEFT HAND  Result Value Ref Range Status   Specimen Description   Final    BLOOD LEFT HAND Performed at [facility] [address] [place], [place] [address]    Special Requests   Final    BOTTLES DRAWN AEROBIC AND ANAEROBIC Blood Culture adequate volume Performed at [facility] [address], [place], [place] [address]    Culture   Final    NO GROWTH < 24 HOURS Performed at [facility] [address] [place], [place] [address]    Report Status PENDING  Incomplete  Culture, blood  (Routine  X 2) w Reflex to ID Panel     Status: None (Preliminary result)   Collection Time: 11/21/20 12:58 PM   Specimen: BLOOD RIGHT HAND  Result Value Ref Range Status   Specimen Description   Final    BLOOD RIGHT HAND Performed at [facility] [address], [place], [place] [address]    Special Requests   Final    BOTTLES DRAWN AEROBIC AND ANAEROBIC Blood Culture adequate volume Performed at [facility] [address] [place], [place] [address]    Culture   Final    NO GROWTH < 24 HOURS Performed at [facility] [address], [place], [place] [address]    Report Status PENDING  Incomplete         Radiology Studies: No results found.      Scheduled Meds: . apixaban  5 mg Oral BID  . atorvastatin  10 mg Oral QHS  . Chlorhexidine Gluconate Cloth  6 each Topical Daily  . diltiazem  30 mg Oral Q8H  . docusate sodium  100 mg Oral Daily  . insulin aspart  0-5 Units Subcutaneous QHS  . insulin aspart  0-9 Units Subcutaneous TID WC  . lidocaine  1 patch Transdermal Q24H  . mouth rinse  15 mL Mouth Rinse BID  . metoprolol tartrate  25 mg Oral BID  . pantoprazole  40 mg Oral Daily  . sodium bicarbonate  1,300 mg Oral BID   Continuous Infusions: . anidulafungin 100 mg (11/22/20 1031)     LOS: 8 days    Time spent:35 mins. More than 50% of that time was spent in counseling and/or coordination of care.      Shelly Coss, MD [facility] P5/09/2020, 8:29 AM

## 2020-11-23 NOTE — Progress Notes (Signed)
Nutrition Follow-up  DOCUMENTATION CODES:  Obesity unspecified  INTERVENTION:  Recommend liberalizing diet to regular to promote more choices in setting of poor PO intake - spoke to MD.  Discontinue Magic Cup TID.  Add Snacks TID.  Add MVI with minerals daily.  NUTRITION DIAGNOSIS:  Inadequate oral intake related to poor appetite as evidenced by per patient/family report. - ongoing  GOAL:  Patient will meet greater than or equal to 90% of their needs - not meeting  MONITOR:  PO intake,Supplement acceptance,Labs,Weight trends,I & O's  REASON FOR ASSESSMENT:  Malnutrition Screening Tool    ASSESSMENT:  70 yo male with a PMH of bladder tumor s/p recent cystoprostatectomy on 4/6 with placement of ileal conduit and lateral stents, PAF, CAD, CHF, and HTN who presents with AKI and hyperkalemia. 4/26 - 1200 ml fluid restriction 4/27 - 2000 ml fluid restriction 5/3 - 1500 ml fluid restriction  Spoke with pt at bedside. Pt reports that he does not like the magic cups and foods taste like cardboard. RD gave patient Boost Breeze and pt did not like it. Per Epic, pt meals infrequently documented, but pt only eating 0-75%, mostly 0%, of meals.   Pt has not had a BM in 9 days. Pt on colace.  RD to try sending snacks between meals and recommend diet be liberalized to regular for more choices.  Medications: colace, Lasix BID, SSI, bedtime novolog, Protonix, sodium bicarb tablet 1300 mg BID, Eraxis 100 mg per IV Labs: reviewed; CBG 87-127  Diet Order:   Diet Order            Diet renal with fluid restriction Fluid restriction: 1500 mL Fluid; Room service appropriate? Yes; Fluid consistency: Thin  Diet effective now                EDUCATION NEEDS:  Education needs have been addressed  Skin:  Skin Assessment: Skin Integrity Issues: Skin Integrity Issues:: Incisions Incisions: Abdomen, closed and penis, closed  Last BM:  11/14/20  Height:  Ht Readings from Last 1 Encounters:   11/17/20 6' (1.829 m)   Weight:  Wt Readings from Last 1 Encounters:  11/23/20 119.1 kg   Ideal Body Weight:  81 kg  BMI:  Body mass index is 35.61 kg/m.  Estimated Nutritional Needs:  Kcal:  2000-2200 Protein:  95-110 grams Fluid:  1500 ml fluid restriction  Derrel Nip, RD, LDN Registered Dietitian After Hours/Weekend Pager # in [place]

## 2020-11-23 NOTE — TOC Progression Note (Signed)
Transition of Care (TOC) - Progression Note    Patient Details  Name: Zachary Shannon. MRN: [id number] Date of Birth: Nov 23, 1950  Transition of Care [facility]) CM/SW Contact  Lowen Barringer, Marjie Skiff, RN Phone Number: 11/23/2020, 3:04 PM  Clinical Narrative:    Spoke with pt daughter Eustaquio Maize via phone as pt defers decisions for dc planning to her. Beth states that her # 1 choice for SNF is [facility] and the # 2 choice is [facility]. Per MD pt is not medically ready for dc. Beth with multiple medical questions and MD asked to contact Beth for questions. TOC will follow along and continue with dc planning when pt is closer to dc.   Expected Discharge Plan: [place] Barriers to Discharge: Continued Medical Work up  Expected Discharge Plan and Services Expected Discharge Plan: [place]   Discharge Planning Services: CM Consult Post Acute Care Choice: [place] arrangements for the past 2 months: Single Family Home                     Readmission Risk Interventions No flowsheet data found.

## 2020-11-23 NOTE — Progress Notes (Signed)
Subjective: Patient still feels poorly   Antibiotics:  Anti-infectives (From admission, onward)   Start     Dose/Rate Route Frequency Ordered Stop   11/24/20 1000  fluconazole (DIFLUCAN) tablet 400 mg        400 mg Oral Daily 11/23/20 1324     11/22/20 1000  anidulafungin (ERAXIS) 100 mg in sodium chloride 0.9 % 100 mL IVPB  Status:  Discontinued        100 mg 78 mL/hr over 100 Minutes Intravenous Every 24 hours 11/21/20 1143 11/23/20 1324   11/21/20 2200  cefdinir (OMNICEF) capsule 300 mg  Status:  Discontinued        300 mg Oral Every 12 hours 11/21/20 1815 11/21/20 1816   11/21/20 2200  cefdinir (OMNICEF) capsule 300 mg  Status:  Discontinued        300 mg Oral Daily at bedtime 11/21/20 1816 11/22/20 0846   11/21/20 1230  anidulafungin (ERAXIS) 200 mg in sodium chloride 0.9 % 200 mL IVPB        200 mg 78 mL/hr over 200 Minutes Intravenous  Once 11/21/20 1142 11/21/20 1723   11/17/20 1300  cefTRIAXone (ROCEPHIN) 2 g in sodium chloride 0.9 % 100 mL IVPB  Status:  Discontinued        2 g 200 mL/hr over 30 Minutes Intravenous Daily 11/17/20 1202 11/21/20 1815      Medications: Scheduled Meds: . apixaban  5 mg Oral BID  . atorvastatin  10 mg Oral QHS  . Chlorhexidine Gluconate Cloth  6 each Topical Daily  . diltiazem  30 mg Oral Q8H  . docusate sodium  100 mg Oral Daily  . [START ON 11/24/2020] fluconazole  400 mg Oral Daily  . furosemide  80 mg Intravenous BID  . insulin aspart  0-5 Units Subcutaneous QHS  . insulin aspart  0-9 Units Subcutaneous TID WC  . lidocaine  1 patch Transdermal Q24H  . mouth rinse  15 mL Mouth Rinse BID  . metoprolol tartrate  25 mg Oral BID  . multivitamin with minerals  1 tablet Oral Daily  . pantoprazole  40 mg Oral Daily  . sodium bicarbonate  1,300 mg Oral BID   Continuous Infusions:  PRN Meds:.acetaminophen **OR** acetaminophen, calcium carbonate (dosed in mg elemental calcium), camphor-menthol **AND** hydrOXYzine, docusate  sodium, hydrALAZINE, labetalol, nitroGLYCERIN, ondansetron **OR** ondansetron (ZOFRAN) IV, sorbitol    Objective: Weight change:   Intake/Output Summary (Last 24 hours) at 11/23/2020 1652 Last data filed at 11/23/2020 1420 Gross per 24 hour  Intake 503 ml  Output 2800 ml  Net -2297 ml   Blood pressure 121/83, pulse 85, temperature 98.4 F (36.9 C), temperature source Oral, resp. rate 18, height 6' (1.829 m), weight 119.1 kg, SpO2 96 %. Temp:  [98 F (36.7 C)-99 F (37.2 C)] 98.4 F (36.9 C) (05/03 1337) Pulse Rate:  [77-94] 85 (05/03 1337) Resp:  [18] 18 (05/03 1337) BP: (121-152)/(79-88) 121/83 (05/03 1337) SpO2:  [92 %-96 %] 96 % (05/03 1337) Weight:  [119.1 kg] 119.1 kg (05/03 0924)  Physical Exam: Physical Exam Constitutional:      Appearance: He is well-developed.  HENT:     Head: Normocephalic and atraumatic.  Eyes:     Conjunctiva/sclera: Conjunctivae normal.  Cardiovascular:     Rate and Rhythm: Normal rate and regular rhythm.     Heart sounds: No murmur heard. No friction rub. No gallop.   Pulmonary:     Effort: Pulmonary  effort is normal. No respiratory distress.     Breath sounds: No stridor. No wheezing.  Abdominal:     General: There is no distension.     Palpations: Abdomen is soft.    Musculoskeletal:        General: Normal range of motion.     Cervical back: Normal range of motion and neck supple.  Skin:    General: Skin is warm and dry.     Findings: No erythema or rash.  Neurological:     Mental Status: He is alert and oriented to person, place, and time.  Psychiatric:        Behavior: Behavior normal.        Thought Content: Thought content normal.        Judgment: Judgment normal.      CBC:    BMET Recent Labs    11/22/20 0545 11/23/20 0529  NA 141 139  K 5.5* 5.1  CL 109 108  CO2 23 22  GLUCOSE 101* 95  BUN 74* 67*  CREATININE 3.95* 3.78*  CALCIUM 8.3* 8.0*     Liver Panel  Recent Labs    11/21/20 1258  11/22/20 0545  ALBUMIN 2.0* 2.0*       Sedimentation Rate No results for input(s): ESRSEDRATE in the last 72 hours. C-Reactive Protein No results for input(s): CRP in the last 72 hours.  Micro Results: Recent Results (from the past 720 hour(s))  SARS CORONAVIRUS 2 (TAT 6-24 HRS) Nasopharyngeal Nasopharyngeal Swab     Status: None   Collection Time: 10/26/20  2:24 PM   Specimen: Nasopharyngeal Swab  Result Value Ref Range Status   SARS Coronavirus 2 NEGATIVE NEGATIVE Final    Comment: (NOTE) SARS-CoV-2 target nucleic acids are NOT DETECTED.  The SARS-CoV-2 RNA is generally detectable in upper and lower respiratory specimens during the acute phase of infection. Negative results do not preclude SARS-CoV-2 infection, do not rule out co-infections with other pathogens, and should not be used as the sole basis for treatment or other patient management decisions. Negative results must be combined with clinical observations, patient history, and epidemiological information. The expected result is Negative.  Fact Sheet for Patients: SugarRoll.be  Fact Sheet for Healthcare Providers: https://www.woods-mathews.com/  This test is not yet approved or cleared by the [place] FDA and  has been authorized for detection and/or diagnosis of SARS-CoV-2 by FDA under an Emergency Use Authorization (EUA). This EUA will remain  in effect (meaning this test can be used) for the duration of the COVID-19 declaration under Se ction 564(b)(1) of the Act, 21 U.S.C. section 360bbb-3(b)(1), unless the authorization is terminated or revoked sooner.  Performed at [facility], [place] [address], [place], [place] [address]   Surgical pcr screen     Status: None   Collection Time: 10/27/20  7:25 AM   Specimen: Nasal Mucosa; Nasal Swab  Result Value Ref Range Status   MRSA, PCR NEGATIVE NEGATIVE Final   Staphylococcus aureus NEGATIVE NEGATIVE Final     Comment: (NOTE) The Xpert SA Assay (FDA approved for NASAL specimens in patients 69 years of age and older), is one component of a comprehensive surveillance program. It is not intended to diagnose infection nor to guide or monitor treatment. Performed at [facility], [place] [address], [place], [place] [address]   Resp Panel by RT-PCR (Flu A&B, Covid) Nasopharyngeal Swab     Status: None   Collection Time: 11/15/20  6:33 PM  Specimen: Nasopharyngeal Swab; Nasopharyngeal(NP) swabs in vial transport medium  Result Value Ref Range Status   SARS Coronavirus 2 by RT PCR NEGATIVE NEGATIVE Final    Comment: (NOTE) SARS-CoV-2 target nucleic acids are NOT DETECTED.  The SARS-CoV-2 RNA is generally detectable in upper respiratory specimens during the acute phase of infection. The lowest concentration of SARS-CoV-2 viral copies this assay can detect is 138 copies/mL. A negative result does not preclude SARS-Cov-2 infection and should not be used as the sole basis for treatment or other patient management decisions. A negative result may occur with  improper specimen collection/handling, submission of specimen other than nasopharyngeal swab, presence of viral mutation(s) within the areas targeted by this assay, and inadequate number of viral copies(<138 copies/mL). A negative result must be combined with clinical observations, patient history, and epidemiological information. The expected result is Negative.  Fact Sheet for Patients:  EntrepreneurPulse.com.au  Fact Sheet for Healthcare Providers:  IncredibleEmployment.be  This test is no t yet approved or cleared by the [place] FDA and  has been authorized for detection and/or diagnosis of SARS-CoV-2 by FDA under an Emergency Use Authorization (EUA). This EUA will remain  in effect (meaning this test can be used) for the duration of the COVID-19 declaration under Section  564(b)(1) of the Act, 21 U.S.C.section 360bbb-3(b)(1), unless the authorization is terminated  or revoked sooner.       Influenza A by PCR NEGATIVE NEGATIVE Final   Influenza B by PCR NEGATIVE NEGATIVE Final    Comment: (NOTE) The Xpert Xpress SARS-CoV-2/FLU/RSV plus assay is intended as an aid in the diagnosis of influenza from Nasopharyngeal swab specimens and should not be used as a sole basis for treatment. Nasal washings and aspirates are unacceptable for Xpert Xpress SARS-CoV-2/FLU/RSV testing.  Fact Sheet for Patients: EntrepreneurPulse.com.au  Fact Sheet for Healthcare Providers: IncredibleEmployment.be  This test is not yet approved or cleared by the [place] FDA and has been authorized for detection and/or diagnosis of SARS-CoV-2 by FDA under an Emergency Use Authorization (EUA). This EUA will remain in effect (meaning this test can be used) for the duration of the COVID-19 declaration under Section 564(b)(1) of the Act, 21 U.S.C. section 360bbb-3(b)(1), unless the authorization is terminated or revoked.  Performed at [facility], [place] [address] [place], [place] [address]   Culture, blood (routine x 2)     Status: Abnormal (Preliminary result)   Collection Time: 11/17/20 12:47 PM   Specimen: BLOOD  Result Value Ref Range Status   Specimen Description   Final    BLOOD RIGHT ANTECUBITAL Performed at [place] [address], [place], [place] [address]    Special Requests   Final    BOTTLES DRAWN AEROBIC AND ANAEROBIC Blood Culture adequate volume Performed at [facility] [address], [place], [place] [address]    Culture  Setup Time   Final    YEAST AEROBIC BOTTLE ONLY CRITICAL RESULT CALLED TO, READ BACK BY AND VERIFIED WITH: TERRY GREEN PHARMD @1002  11/21/20 EB    Culture (A)  Final    CANDIDA ALBICANS Sent to [facility] for further susceptibility  testing. Performed at [facility], [place] [address] [place], [place] [address]    Report Status PENDING  Incomplete  Culture, Urine     Status: Abnormal   Collection Time: 11/17/20  1:40 PM   Specimen: Urine, Random  Result Value Ref Range Status   Specimen Description   Final  URINE, RANDOM Performed at [facility], [place] [address] [place], [place] [address]    Special Requests   Final    NONE Performed at [facility], [place] [address] [place], [place] [address]    Culture MULTIPLE SPECIES PRESENT, SUGGEST RECOLLECTION (A)  Final   Report Status 11/19/2020 FINAL  Final  MRSA PCR Screening     Status: None   Collection Time: 11/17/20  3:49 PM   Specimen: Nasal Mucosa; Nasopharyngeal  Result Value Ref Range Status   MRSA by PCR NEGATIVE NEGATIVE Final    Comment:        The GeneXpert MRSA Assay (FDA approved for NASAL specimens only), is one component of a comprehensive MRSA colonization surveillance program. It is not intended to diagnose MRSA infection nor to guide or monitor treatment for MRSA infections. Performed at [facility], [place] [address], [place], [place] [address]   Culture, blood (Routine X 2) w Reflex to ID Panel     Status: None (Preliminary result)   Collection Time: 11/21/20 12:58 PM   Specimen: BLOOD LEFT HAND  Result Value Ref Range Status   Specimen Description   Final    BLOOD LEFT HAND Performed at [facility] [address], [place], [place] [address]    Special Requests   Final    BOTTLES DRAWN AEROBIC AND ANAEROBIC Blood Culture adequate volume Performed at [place] [address], [place], [place] [address]    Culture   Final    NO GROWTH 2 DAYS Performed at [facility] [address] [place], [place] [address]    Report Status PENDING  Incomplete  Culture, blood (Routine X 2) w Reflex to ID Panel     Status: None  (Preliminary result)   Collection Time: 11/21/20 12:58 PM   Specimen: BLOOD RIGHT HAND  Result Value Ref Range Status   Specimen Description   Final    BLOOD RIGHT HAND Performed at [facility] [address] [place], [place] [address]    Special Requests   Final    BOTTLES DRAWN AEROBIC AND ANAEROBIC Blood Culture adequate volume Performed at [facility] [address], [place], [place] [address]    Culture   Final    NO GROWTH 2 DAYS Performed at [facility] [address], [place],  [address]    Report Status PENDING  Incomplete    Studies/Results: DG Chest 2 View  Result Date: 11/23/2020 CLINICAL DATA:  Volume axis. EXAM: CHEST - 2 VIEW COMPARISON:  No prior. FINDINGS: Cardiomegaly with mild pulmonary vascular prominence. Low lung volumes with mild bibasilar atelectasis. Small bilateral pleural effusions cannot be excluded. No pneumothorax. IMPRESSION: Cardiomegaly with mild pulmonary venous congestion. Low lung volumes with mild bibasilar atelectasis. Small bilateral pleural effusions cannot be excluded. Electronically Signed   By: Marcello Moores  Register   On: 11/23/2020 15:28   US RENAL  Result Date: 11/23/2020 CLINICAL DATA:  Acute kidney injury EXAM: RENAL / URINARY TRACT ULTRASOUND COMPLETE COMPARISON:  None. FINDINGS: Right Kidney: Renal measurements: 12.6 x 6.0 x 6.1 cm = volume: 240.6 mL. Echogenicity is mildly increased. Renal cortical thickness normal. No mass, perinephric fluid, or hydronephrosis visualized. No sonographically demonstrable calculus or ureterectasis. Left Kidney: Renal measurements: 12.1 x 6.5 x 6.2 cm = volume: 255.8 mL. Echogenicity is mildly increased. Renal cortical thickness normal. No mass, perinephric fluid, or hydronephrosis visualized. No sonographically demonstrable  calculus or ureterectasis. Bladder: Appears normal for degree of bladder distention. Other: None. IMPRESSION: Mild increase in renal  echogenicity, a finding likely indicative of underlying medical renal disease. Study otherwise unremarkable. Note that there is no obstructing focus in either kidney. Electronically Signed   By: Lowella Grip III M.D.   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 is a 70 y.o. male with high-grade bladder cancer status post cystoscopy with a radical cystoprostatectomy and ileal conduit creation pelvic node dissection bilateral inguinal hernia repair laparoscopically ureteral stent placement, now admitted with hematuria concern for sepsis, with blood cultures positive for Candida albicans  1.  Candidemia: Given that this is Candida albicans we can switch over from Eraxis to fluconazole  Repeat blood cultures have been taken  He does need a dilated funduscopic exam to rule out fungal endophthalmitis  Note apparently Dr. Valetta Close does not see the need for fundoscopic exam because the patient "only had 1 blood culture +" --BUT HE ONLY HAD ONE BLOOD CULTURE TAKEN along with his urine culture on the 27th.  I am confident BOTH peripheral sites would have been + for Candida had 2 sites been sampled  I also do not think he will be well enough to get to an ophthalmology office to have a dilated funduscopic exam in a timely manner given his current condition.  Given the fact that he had stents placed recently in his bladder and our concerns that the urine was the source of his bacteremia we may need to give him extended course of fluconazole especially if the stents might be removed in the future.  Would like to see if there is a plan for this with urology.  If on the other hand they  are going to be left in place for protracted period of time with no plans for removal I would favor giving him a 2-week course of fluconazole and follow this with watchful waiting.   LOS: 8 days   Alcide Evener 11/23/2020, 4:52 PM

## 2020-11-23 NOTE — Progress Notes (Signed)
Physical Therapy Treatment Patient Details Name: Zachary Shannon. MRN: [id number] DOB: 1950/10/12 Today's Date: 11/23/2020    History of Present Illness 70 year old with PMH significant of bladder tumor status post recent cystoprostatectomy on April 6 with placement of ileal conduit and lateral stents.  Patient presented to the ER complaining of flank and suprapubic pain and confusion. Pt admitted for acute renal failure. Transferred to ICU 4/27 for cardiac monitoring    PT Comments    Patient consented to get up, ambulated x 20' using Rw, gait fairly steady using RW.  patient reports that he has had no back pain until he got up to ambulate.  continue PT.  Follow Up Recommendations  SNF     Equipment Recommendations  Rolling walker with 5" wheels    Recommendations for Other Services       Precautions / Restrictions Precautions Precaution Comments: urostomy    Mobility  Bed Mobility   Bed Mobility: Supine to Sit     Supine to sit: Supervision     General bed mobility comments: use of rail, HOB raised.    Transfers Overall transfer level: Needs assistance Equipment used: Rolling walker (2 wheeled) Transfers: Sit to/from Stand Sit to Stand: Min guard         General transfer comment: min guard from bed.  Ambulation/Gait Ambulation/Gait assistance: Min guard Gait Distance (Feet): 20 Feet Assistive device: Rolling walker (2 wheeled) Gait Pattern/deviations: Step-through pattern;Trunk flexed Gait velocity: decreased   General Gait Details: patient ambulated somewhat speedy,  cues for safety.   Stairs             Wheelchair Mobility    Modified Rankin (Stroke Patients Only)       Balance Overall balance assessment: Needs assistance Sitting-balance support: Feet supported Sitting balance-Leahy Scale: Good Sitting balance - Comments: seated EOB   Standing balance support: Bilateral upper extremity supported Standing balance-Leahy  Scale: Fair Standing balance comment: reliant on UE support                            Cognition Arousal/Alertness: Awake/alert Behavior During Therapy: WFL for tasks assessed/performed;Flat affect Overall Cognitive Status: Within Functional Limits for tasks assessed                                        Exercises      General Comments        Pertinent Vitals/Pain Faces Pain Scale: Hurts even more Pain Location: mid back Pain Descriptors / Indicators: Grimacing Pain Intervention(s): Patient requesting pain meds-RN notified    Home Living                      Prior Function            PT Goals (current goals can now be found in the care plan section) Progress towards PT goals: Progressing toward goals    Frequency    Min 3X/week      PT Plan Current plan remains appropriate;Discharge plan needs to be updated    Co-evaluation              AM-PAC PT "6 Clicks" Mobility   Outcome Measure  Help needed turning from your back to your side while in a flat bed without using bedrails?: A Little Help needed moving from lying on your back  to sitting on the side of a flat bed without using bedrails?: A Little Help needed moving to and from a bed to a chair (including a wheelchair)?: A Little Help needed standing up from a chair using your arms (e.g., wheelchair or bedside chair)?: A Little Help needed to walk in hospital room?: A Little Help needed climbing 3-5 steps with a railing? : A Lot 6 Click Score: 17    End of Session Equipment Utilized During Treatment: Gait belt Activity Tolerance: Patient limited by pain Patient left: in chair;with call bell/phone within reach;with family/visitor present Nurse Communication: Mobility status PT Visit Diagnosis: Other abnormalities of gait and mobility (R26.89)     Time: 1600-1620 PT Time Calculation (min) (ACUTE ONLY): 20 min  Charges:  $Gait Training: 8-22 mins                     Tresa Endo PT Acute Rehabilitation Services Pager [phone] Office [place] 11/23/2020, 4:31 PM

## 2020-11-23 NOTE — Progress Notes (Signed)
Subjective/Chief Complaint:   1 - Bladder Cancer - s/p robotic cystoprostatectomy with node dissection and conduit diversion 10/27/20 for pT1N0Mx recurrent high grade bladder cancer with negative margins.   2 - Prostate Cancer - incidetnal grade 1 cancer with negative margins at cystectomy 10/2020  3 - Urinary Diversion - s/ p conduit diversion as part of bladder cancer surgery 10/2020 with bricker type (refluxing) anastamoses  4 -  Acute Renal Failure - Cr 6s with K 6 by ER labs 11/15/20. CT without hydro and bilateral bander stents in good position. Baselin Cr <1.1 and JP Cr same as serum before removal after recent cystectomy. Reports very poor PO intake  5 - Pyelonephritis / Fungemia- subjectgvie malaise and bilateral flank pain x few days prior to admission. CT mild perinephric stranding. Mild leukocytosis concerning for possible early pyelo. UCX polyclonal, BCX 4/27 candida. [place] pending. Started on empiric ceftriaxone 4/27, changed to fluconazole 5/2.   PMH sig for AFib/DVT/ELiquus (follows [facility] with [facility], has clearnce to stop eliqus 48 hours prior surgery), morbid obesity, hip OA (possible replacement pending). He is retired from [employer], then [employer] in [place]. His daughter Eustaquio Maize is very involved and his POA. His PCP is Chesley Noon DO with [facility] in [place].   Today "D[place]" is stable. Cr still 3's, FeNa 1.5% and no hydro most c/w intrinsic renal. BCX candida and now on antifungal per ID recs.   Objective: Vital signs in last 24 hours: Temp:  [98 F (36.7 C)-99 F (37.2 C)] 98.4 F (36.9 C) (05/03 1337) Pulse Rate:  [77-94] 85 (05/03 1337) Resp:  [18] 18 (05/03 1337) BP: (121-152)/(79-88) 121/83 (05/03 1337) SpO2:  [92 %-96 %] 96 % (05/03 1337) Weight:  [119.1 kg] 119.1 kg (05/03 0924) Last BM Date: 11/14/20  Intake/Output from previous day: 05/02 0701 - 05/03 0700 In: 737 [P.O.:743] Out: 1600  [Urine:1600] Intake/Output this shift: Total I/O In: -  Out: 1200 [Urine:1200]   Physical Exam Vitals reviewed.  Constitutional:      Comments: Pleasant, but stigmata of chronic disesae, stable HENT:     Mouth/Throat:     Mouth: Mucous membranes are moist.  Eyes:     Pupils: Pupils are equal, round, and reactive to light.  Cardiovascular:     Rate and Rhythm: Normal rate.  Pulmonary:     Effort: Pulmonary effort is normal.  Abdominal:     Comments: Stable large truncal obesity. Recent surgical sites w/o hernias.  RLQ Urostomy with opque appliance in place.   Skin:    General: Skin is warm.  Psychiatric:        Mood and Affect: Mood normal.    Lab Results:  Recent Labs    11/21/20 1258 11/23/20 0529  WBC 14.1* 13.4*  HGB 10.7* 10.4*  HCT 33.3* 33.7*  PLT 337 325   BMET Recent Labs    11/22/20 0545 11/23/20 0529  NA 141 139  K 5.5* 5.1  CL 109 108  CO2 23 22  GLUCOSE 101* 95  BUN 74* 67*  CREATININE 3.95* 3.78*  CALCIUM 8.3* 8.0*   PT/INR No results for input(s): LABPROT, INR in the last 72 hours. ABG No results for input(s): PHART, HCO3 in the last 72 hours.  Invalid input(s): PCO2, PO2  Studies/Results: DG Chest 2 View  Result Date: 11/23/2020 CLINICAL DATA:  Volume axis. EXAM: CHEST - 2 VIEW COMPARISON:  No prior. FINDINGS: Cardiomegaly with mild pulmonary vascular prominence. Low lung volumes with mild  bibasilar atelectasis. Small bilateral pleural effusions cannot be excluded. No pneumothorax. IMPRESSION: Cardiomegaly with mild pulmonary venous congestion. Low lung volumes with mild bibasilar atelectasis. Small bilateral pleural effusions cannot be excluded. Electronically Signed   By: Marcello Moores  Register   On: 11/23/2020 15:28   US RENAL  Result Date: 11/23/2020 CLINICAL DATA:  Acute kidney injury EXAM: RENAL / URINARY TRACT ULTRASOUND COMPLETE COMPARISON:  None. FINDINGS: Right Kidney: Renal measurements: 12.6 x 6.0 x 6.1 cm = volume: 240.6 mL.  Echogenicity is mildly increased. Renal cortical thickness normal. No mass, perinephric fluid, or hydronephrosis visualized. No sonographically demonstrable calculus or ureterectasis. Left Kidney: Renal measurements: 12.1 x 6.5 x 6.2 cm = volume: 255.8 mL. Echogenicity is mildly increased. Renal cortical thickness normal. No mass, perinephric fluid, or hydronephrosis visualized. No sonographically demonstrable calculus or ureterectasis. Bladder: Appears normal for degree of bladder distention. Other: None. IMPRESSION: Mild increase in renal echogenicity, a finding likely indicative of underlying medical renal disease. Study otherwise unremarkable. Note that there is no obstructing focus in either kidney. Electronically Signed   By: Lowella Grip III M.D.   On: 11/23/2020 14:17    Anti-infectives: Anti-infectives (From admission, onward)   Start     Dose/Rate Route Frequency Ordered Stop   11/24/20 1000  fluconazole (DIFLUCAN) tablet 400 mg        400 mg Oral Daily 11/23/20 1324     11/22/20 1000  anidulafungin (ERAXIS) 100 mg in sodium chloride 0.9 % 100 mL IVPB  Status:  Discontinued        100 mg 78 mL/hr over 100 Minutes Intravenous Every 24 hours 11/21/20 1143 11/23/20 1324   11/21/20 2200  cefdinir (OMNICEF) capsule 300 mg  Status:  Discontinued        300 mg Oral Every 12 hours 11/21/20 1815 11/21/20 1816   11/21/20 2200  cefdinir (OMNICEF) capsule 300 mg  Status:  Discontinued        300 mg Oral Daily at bedtime 11/21/20 1816 11/22/20 0846   11/21/20 1230  anidulafungin (ERAXIS) 200 mg in sodium chloride 0.9 % 200 mL IVPB        200 mg 78 mL/hr over 200 Minutes Intravenous  Once 11/21/20 1142 11/21/20 1723   11/17/20 1300  cefTRIAXone (ROCEPHIN) 2 g in sodium chloride 0.9 % 100 mL IVPB  Status:  Discontinued        2 g 200 mL/hr over 30 Minutes Intravenous Daily 11/17/20 1202 11/21/20 1815      Assessment/Plan:  1 - Bladder Cancer - very good long term prognosis after cystectomy.    2 - Prostate Cancer - very good long term prognosis after prostatectomy.  3 - Urinary Diversion - no hydor to suggest any sort of acute obstruction. Keep current bander renal stents in place for now as these help ensure no obstructive etiology to his ARF.   4 -  Acute Renal Failure - Suspect mostly intrinsic renal exacerbated by fungal pyelo.Greatly appreciate nephrology comangement.    5 -  Pyelonephritis / Fungemia - greatly appreciate ID help and agree with antifungal. WE will also use around time of stent removal in few weeks.   Alexis Frock 11/23/2020

## 2020-11-23 NOTE — Plan of Care (Signed)

## 2020-11-24 DIAGNOSIS — N12 Tubulo-interstitial nephritis, not specified as acute or chronic: Secondary | ICD-10-CM

## 2020-11-24 DIAGNOSIS — B377 Candidal sepsis: Secondary | ICD-10-CM | POA: Diagnosis not present

## 2020-11-24 DIAGNOSIS — C679 Malignant neoplasm of bladder, unspecified: Secondary | ICD-10-CM | POA: Diagnosis not present

## 2020-11-24 DIAGNOSIS — N179 Acute kidney failure, unspecified: Secondary | ICD-10-CM | POA: Diagnosis not present

## 2020-11-24 DIAGNOSIS — E782 Mixed hyperlipidemia: Secondary | ICD-10-CM | POA: Diagnosis not present

## 2020-11-24 LAB — RESP PANEL BY RT-PCR (FLU A&B, COVID) ARPGX2
Influenza A by PCR: NEGATIVE
Influenza B by PCR: NEGATIVE
SARS Coronavirus 2 by RT PCR: NEGATIVE

## 2020-11-24 LAB — BASIC METABOLIC PANEL
Anion gap: 10 (ref 5–15)
BUN: 64 mg/dL — ABNORMAL HIGH (ref 8–23)
CO2: 27 mmol/L (ref 22–32)
Calcium: 8.4 mg/dL — ABNORMAL LOW (ref 8.9–10.3)
Chloride: 105 mmol/L (ref 98–111)
Creatinine, Ser: 3.47 mg/dL — ABNORMAL HIGH (ref 0.61–1.24)
GFR, Estimated: 18 mL/min — ABNORMAL LOW (ref >60)
Glucose, Bld: 94 mg/dL (ref 70–99)
Potassium: 4.7 mmol/L (ref 3.5–5.1)
Sodium: 142 mmol/L (ref 135–145)

## 2020-11-24 LAB — CBC WITH DIFFERENTIAL/PLATELET
Abs Immature Granulocytes: 0.54 10*3/uL — ABNORMAL HIGH (ref 0.00–0.07)
Basophils Absolute: 0.1 10*3/uL (ref 0.0–0.1)
Basophils Relative: 1 %
Eosinophils Absolute: 0.5 10*3/uL (ref 0.0–0.5)
Eosinophils Relative: 4 %
HCT: 34.2 % — ABNORMAL LOW (ref 39.0–52.0)
Hemoglobin: 10.6 g/dL — ABNORMAL LOW (ref 13.0–17.0)
Immature Granulocytes: 4 %
Lymphocytes Relative: 6 %
Lymphs Abs: 0.7 10*3/uL (ref 0.7–4.0)
MCH: 31.6 pg (ref 26.0–34.0)
MCHC: 31 g/dL (ref 30.0–36.0)
MCV: 102.1 fL — ABNORMAL HIGH (ref 80.0–100.0)
Monocytes Absolute: 1 10*3/uL (ref 0.1–1.0)
Monocytes Relative: 8 %
Neutro Abs: 10.5 10*3/uL — ABNORMAL HIGH (ref 1.7–7.7)
Neutrophils Relative %: 77 %
Platelets: 357 10*3/uL (ref 150–400)
RBC: 3.35 MIL/uL — ABNORMAL LOW (ref 4.22–5.81)
RDW: 15.3 % (ref 11.5–15.5)
WBC: 13.4 10*3/uL — ABNORMAL HIGH (ref 4.0–10.5)
nRBC: 0 % (ref 0.0–0.2)

## 2020-11-24 LAB — GLUCOSE, CAPILLARY
Glucose-Capillary: 93 mg/dL (ref 70–99)
Glucose-Capillary: 104 mg/dL — ABNORMAL HIGH (ref 70–99)
Glucose-Capillary: 110 mg/dL — ABNORMAL HIGH (ref 70–99)
Glucose-Capillary: 103 mg/dL — ABNORMAL HIGH (ref 70–99)

## 2020-11-24 NOTE — Progress Notes (Signed)
Subjective: Patient still feels poorly   Antibiotics:  Anti-infectives (From admission, onward)   Start     Dose/Rate Route Frequency Ordered Stop   11/24/20 1000  fluconazole (DIFLUCAN) tablet 400 mg        400 mg Oral Daily 11/23/20 1324     11/22/20 1000  anidulafungin (ERAXIS) 100 mg in sodium chloride 0.9 % 100 mL IVPB  Status:  Discontinued        100 mg 78 mL/hr over 100 Minutes Intravenous Every 24 hours 11/21/20 1143 11/23/20 1324   11/21/20 2200  cefdinir (OMNICEF) capsule 300 mg  Status:  Discontinued        300 mg Oral Every 12 hours 11/21/20 1815 11/21/20 1816   11/21/20 2200  cefdinir (OMNICEF) capsule 300 mg  Status:  Discontinued        300 mg Oral Daily at bedtime 11/21/20 1816 11/22/20 0846   11/21/20 1230  anidulafungin (ERAXIS) 200 mg in sodium chloride 0.9 % 200 mL IVPB        200 mg 78 mL/hr over 200 Minutes Intravenous  Once 11/21/20 1142 11/21/20 1723   11/17/20 1300  cefTRIAXone (ROCEPHIN) 2 g in sodium chloride 0.9 % 100 mL IVPB  Status:  Discontinued        2 g 200 mL/hr over 30 Minutes Intravenous Daily 11/17/20 1202 11/21/20 1815      Medications: Scheduled Meds: . apixaban  5 mg Oral BID  . atorvastatin  10 mg Oral QHS  . Chlorhexidine Gluconate Cloth  6 each Topical Daily  . diltiazem  30 mg Oral Q8H  . docusate sodium  100 mg Oral Daily  . fluconazole  400 mg Oral Daily  . furosemide  80 mg Intravenous BID  . insulin aspart  0-5 Units Subcutaneous QHS  . insulin aspart  0-9 Units Subcutaneous TID WC  . lidocaine  1 patch Transdermal Q24H  . mouth rinse  15 mL Mouth Rinse BID  . metoprolol tartrate  25 mg Oral BID  . multivitamin with minerals  1 tablet Oral Daily  . pantoprazole  40 mg Oral Daily  . sodium bicarbonate  1,300 mg Oral BID   Continuous Infusions:  PRN Meds:.acetaminophen **OR** acetaminophen, calcium carbonate (dosed in mg elemental calcium), camphor-menthol **AND** hydrOXYzine, docusate sodium, hydrALAZINE,  labetalol, nitroGLYCERIN, ondansetron **OR** ondansetron (ZOFRAN) IV, sorbitol    Objective: Weight change: 0 kg  Intake/Output Summary (Last 24 hours) at 11/24/2020 1843 Last data filed at 11/24/2020 1751 Gross per 24 hour  Intake 180 ml  Output 5665 ml  Net -5485 ml   Blood pressure 116/77, pulse 87, temperature 98.1 F (36.7 C), temperature source Oral, resp. rate 18, height 6' (1.829 m), weight 115.3 kg, SpO2 92 %. Temp:  [98 F (36.7 C)-98.3 F (36.8 C)] 98.1 F (36.7 C) (05/04 1342) Pulse Rate:  [83-92] 87 (05/04 1342) Resp:  [16-18] 18 (05/04 1342) BP: (116-129)/(77-86) 116/77 (05/04 1342) SpO2:  [92 %-96 %] 92 % (05/04 1342) Weight:  [115.3 kg] 115.3 kg (05/04 0528)  Physical Exam: Physical Exam Constitutional:      Appearance: He is well-developed.  HENT:     Head: Normocephalic and atraumatic.  Eyes:     Conjunctiva/sclera: Conjunctivae normal.  Cardiovascular:     Rate and Rhythm: Normal rate and regular rhythm.     Heart sounds: No murmur heard. No friction rub. No gallop.   Pulmonary:     Effort: Pulmonary effort is  normal. No respiratory distress.     Breath sounds: No stridor. No wheezing.  Abdominal:     General: There is no distension.     Palpations: Abdomen is soft.    Musculoskeletal:        General: Normal range of motion.     Cervical back: Normal range of motion and neck supple.  Skin:    General: Skin is warm and dry.     Findings: No erythema or rash.  Neurological:     Mental Status: He is alert and oriented to person, place, and time.  Psychiatric:        Behavior: Behavior normal.        Thought Content: Thought content normal.        Judgment: Judgment normal.      CBC:    BMET Recent Labs    11/23/20 0529 11/24/20 0521  NA 139 142  K 5.1 4.7  CL 108 105  CO2 22 27  GLUCOSE 95 94  BUN 67* 64*  CREATININE 3.78* 3.47*  CALCIUM 8.0* 8.4*     Liver Panel  Recent Labs    11/22/20 0545  ALBUMIN 2.0*        Sedimentation Rate No results for input(s): ESRSEDRATE in the last 72 hours. C-Reactive Protein No results for input(s): CRP in the last 72 hours.  Micro Results: Recent Results (from the past 720 hour(s))  SARS CORONAVIRUS 2 (TAT 6-24 HRS) Nasopharyngeal Nasopharyngeal Swab     Status: None   Collection Time: 10/26/20  2:24 PM   Specimen: Nasopharyngeal Swab  Result Value Ref Range Status   SARS Coronavirus 2 NEGATIVE NEGATIVE Final    Comment: (NOTE) SARS-CoV-2 target nucleic acids are NOT DETECTED.  The SARS-CoV-2 RNA is generally detectable in upper and lower respiratory specimens during the acute phase of infection. Negative results do not preclude SARS-CoV-2 infection, do not rule out co-infections with other pathogens, and should not be used as the sole basis for treatment or other patient management decisions. Negative results must be combined with clinical observations, patient history, and epidemiological information. The expected result is Negative.  Fact Sheet for Patients: SugarRoll.be  Fact Sheet for Healthcare Providers: https://www.woods-mathews.com/  This test is not yet approved or cleared by the [place] FDA and  has been authorized for detection and/or diagnosis of SARS-CoV-2 by FDA under an Emergency Use Authorization (EUA). This EUA will remain  in effect (meaning this test can be used) for the duration of the COVID-19 declaration under Se ction 564(b)(1) of the Act, 21 U.S.C. section 360bbb-3(b)(1), unless the authorization is terminated or revoked sooner.  Performed at [facility], [place] [address] [place], [place] [address]   Surgical pcr screen     Status: None   Collection Time: 10/27/20  7:25 AM   Specimen: Nasal Mucosa; Nasal Swab  Result Value Ref Range Status   MRSA, PCR NEGATIVE NEGATIVE Final   Staphylococcus aureus NEGATIVE NEGATIVE Final    Comment: (NOTE) The Xpert SA  Assay (FDA approved for NASAL specimens in patients 81 years of age and older), is one component of a comprehensive surveillance program. It is not intended to diagnose infection nor to guide or monitor treatment. Performed at [facility], [place] [address] [place], [place] [address]   Resp Panel by RT-PCR (Flu A&B, Covid) Nasopharyngeal Swab     Status: None   Collection Time: 11/15/20  6:33 PM   Specimen: Nasopharyngeal Swab; Nasopharyngeal(NP) swabs  in vial transport medium  Result Value Ref Range Status   SARS Coronavirus 2 by RT PCR NEGATIVE NEGATIVE Final    Comment: (NOTE) SARS-CoV-2 target nucleic acids are NOT DETECTED.  The SARS-CoV-2 RNA is generally detectable in upper respiratory specimens during the acute phase of infection. The lowest concentration of SARS-CoV-2 viral copies this assay can detect is 138 copies/mL. A negative result does not preclude SARS-Cov-2 infection and should not be used as the sole basis for treatment or other patient management decisions. A negative result may occur with  improper specimen collection/handling, submission of specimen other than nasopharyngeal swab, presence of viral mutation(s) within the areas targeted by this assay, and inadequate number of viral copies(<138 copies/mL). A negative result must be combined with clinical observations, patient history, and epidemiological information. The expected result is Negative.  Fact Sheet for Patients:  EntrepreneurPulse.com.au  Fact Sheet for Healthcare Providers:  IncredibleEmployment.be  This test is no t yet approved or cleared by the [place] FDA and  has been authorized for detection and/or diagnosis of SARS-CoV-2 by FDA under an Emergency Use Authorization (EUA). This EUA will remain  in effect (meaning this test can be used) for the duration of the COVID-19 declaration under Section 564(b)(1) of the Act, 21 U.S.C.section  360bbb-3(b)(1), unless the authorization is terminated  or revoked sooner.       Influenza A by PCR NEGATIVE NEGATIVE Final   Influenza B by PCR NEGATIVE NEGATIVE Final    Comment: (NOTE) The Xpert Xpress SARS-CoV-2/FLU/RSV plus assay is intended as an aid in the diagnosis of influenza from Nasopharyngeal swab specimens and should not be used as a sole basis for treatment. Nasal washings and aspirates are unacceptable for Xpert Xpress SARS-CoV-2/FLU/RSV testing.  Fact Sheet for Patients: EntrepreneurPulse.com.au  Fact Sheet for Healthcare Providers: IncredibleEmployment.be  This test is not yet approved or cleared by the [place] FDA and has been authorized for detection and/or diagnosis of SARS-CoV-2 by FDA under an Emergency Use Authorization (EUA). This EUA will remain in effect (meaning this test can be used) for the duration of the COVID-19 declaration under Section 564(b)(1) of the Act, 21 U.S.C. section 360bbb-3(b)(1), unless the authorization is terminated or revoked.  Performed at [facility], [place] [address], [place], [place] [address]   Culture, blood (routine x 2)     Status: Abnormal (Preliminary result)   Collection Time: 11/17/20 12:47 PM   Specimen: BLOOD  Result Value Ref Range Status   Specimen Description   Final    BLOOD RIGHT ANTECUBITAL Performed at [facility] [address] [place], [place] [address]    Special Requests   Final    BOTTLES DRAWN AEROBIC AND ANAEROBIC Blood Culture adequate volume Performed at [facility] [address] [place], [place] [address]    Culture  Setup Time   Final    YEAST AEROBIC BOTTLE ONLY CRITICAL RESULT CALLED TO, READ BACK BY AND VERIFIED WITH: TERRY GREEN PHARMD @1002  11/21/20 EB    Culture (A)  Final    CANDIDA ALBICANS Sent to [facility] for further susceptibility testing. Performed at [facility], [place] [address], [place], [place] [address]    Report Status PENDING  Incomplete  Antifungal AST 9 Drug Panel     Status: None (Preliminary result)   Collection Time: 11/17/20 12:47 PM  Result Value Ref Range Status   Organism ID, Yeast Preliminary report  Final    Comment: (NOTE)  Specimen has been received and testing has been initiated. Performed At: [facility] [place], [place] [id number] Rush Farmer MD [id number]    Amphotericin B MIC PENDING  Incomplete   Please Note: PENDING  Incomplete   Anidulafungin MIC PENDING  Incomplete   Caspofungin MIC PENDING  Incomplete   Micafungin MIC PENDING  Incomplete   Posaconazole MIC PENDING  Incomplete   Please Note: PENDING  Incomplete   Fluconazole Islt MIC PENDING  Incomplete   Flucytosine MIC PENDING  Incomplete   Itraconazole MIC PENDING  Incomplete   Voriconazole MIC PENDING  Incomplete   Source CANDIDA ALBICANS FROM BLOOD CULTURE  Final    Comment: Performed at [facility], [place] [address] [place], [place] [address]  Culture, Urine     Status: Abnormal   Collection Time: 11/17/20  1:40 PM   Specimen: Urine, Random  Result Value Ref Range Status   Specimen Description   Final    URINE, RANDOM Performed at [place] [address], [place], [place] [address]    Special Requests   Final    NONE Performed at [facility], [place] [address], [place], [place] [address]    Culture MULTIPLE SPECIES PRESENT, SUGGEST RECOLLECTION (A)  Final   Report Status 11/19/2020 FINAL  Final  MRSA PCR Screening     Status: None   Collection Time: 11/17/20  3:49 PM   Specimen: Nasal Mucosa; Nasopharyngeal  Result Value Ref Range Status   MRSA by PCR NEGATIVE NEGATIVE Final    Comment:        The GeneXpert MRSA Assay (FDA approved for NASAL specimens only), is one component of a comprehensive MRSA colonization surveillance program. It is not intended to diagnose  MRSA infection nor to guide or monitor treatment for MRSA infections. Performed at [facility], [place] [address], [place], [place] [address]   Culture, blood (Routine X 2) w Reflex to ID Panel     Status: None (Preliminary result)   Collection Time: 11/21/20 12:58 PM   Specimen: BLOOD LEFT HAND  Result Value Ref Range Status   Specimen Description   Final    BLOOD LEFT HAND Performed at [facility] [address] [place], [place] [address]    Special Requests   Final    BOTTLES DRAWN AEROBIC AND ANAEROBIC Blood Culture adequate volume Performed at [facility] [address] [place], [place] [address]    Culture   Final    NO GROWTH 3 DAYS Performed at [facility], [place] [address] [place], [place] [address]    Report Status PENDING  Incomplete  Culture, blood (Routine X 2) w Reflex to ID Panel     Status: None (Preliminary result)   Collection Time: 11/21/20 12:58 PM   Specimen: BLOOD RIGHT HAND  Result Value Ref Range Status   Specimen Description   Final    BLOOD RIGHT HAND Performed at [facility] [address], [place], [place] [address]    Special Requests   Final    BOTTLES DRAWN AEROBIC AND ANAEROBIC Blood Culture adequate volume Performed at [facility] [address] [place], [place] [address]    Culture   Final    NO GROWTH 3 DAYS Performed at [facility], [place] [address], [place], [place] [address]    Report Status PENDING  Incomplete    Studies/Results: DG Chest  2 View  Result Date: 11/23/2020 CLINICAL DATA:  Volume axis. EXAM: CHEST - 2 VIEW COMPARISON:  No prior. FINDINGS: Cardiomegaly with mild pulmonary vascular prominence. Low lung volumes with mild bibasilar atelectasis. Small bilateral pleural effusions cannot be excluded. No pneumothorax. IMPRESSION: Cardiomegaly with mild pulmonary venous congestion. Low lung volumes with mild bibasilar  atelectasis. Small bilateral pleural effusions cannot be excluded. Electronically Signed   By: Marcello Moores  Register   On: 11/23/2020 15:28   US RENAL  Result Date: 11/23/2020 CLINICAL DATA:  Acute kidney injury EXAM: RENAL / URINARY TRACT ULTRASOUND COMPLETE COMPARISON:  None. FINDINGS: Right Kidney: Renal measurements: 12.6 x 6.0 x 6.1 cm = volume: 240.6 mL. Echogenicity is mildly increased. Renal cortical thickness normal. No mass, perinephric fluid, or hydronephrosis visualized. No sonographically demonstrable calculus or ureterectasis. Left Kidney: Renal measurements: 12.1 x 6.5 x 6.2 cm = volume: 255.8 mL. Echogenicity is mildly increased. Renal cortical thickness normal. No mass, perinephric fluid, or hydronephrosis visualized. No sonographically demonstrable calculus or ureterectasis. Bladder: Appears normal for degree of bladder distention. Other: None. IMPRESSION: Mild increase in renal echogenicity, a finding likely indicative of underlying medical renal disease. Study otherwise unremarkable. Note that there is no obstructing focus in either kidney. Electronically Signed   By: Lowella Grip III M.D.   On: 11/23/2020 14:17      Assessment/Plan:  INTERVAL HISTORY:   He feels better   Principal Problem:   Candidemia ([facility]) Active Problems:   Bladder cancer ([place])   AKI (acute kidney injury) ([place])   PAF (paroxysmal atrial fibrillation) ([place])   Hyperkalemia   Hyperlipidemia   Chest pressure   Pyelonephritis    Zachary [place] Calyb Simonson. is a 70 y.o. male with high-grade bladder cancer status post cystoscopy with a radical cystoprostatectomy and ileal conduit creation pelvic node dissection bilateral inguinal hernia repair laparoscopically ureteral stent placement, now admitted with hematuria concern for sepsis, with blood cultures positive for Candida albicans  1.  Candidemia: Given that this is Candida albicans we can switch over from Eraxis to fluconazole  Repeat blood cultures  have been taken  He does need a dilated funduscopic exam to rule out fungal endophthalmitis  Note apparently Dr. Valetta Close does not see the need for fundoscopic exam because the patient "only had 1 blood culture +" --BUT HE ONLY HAD ONE BLOOD CULTURE TAKEN along with his urine culture on the 27th.  I am confident BOTH peripheral sites would have been + for Candida had 2 sites been sampled  I also do not think he will be well enough to get to an ophthalmology office to have a dilated funduscopic exam in a timely manner given his current condition.  Given the fact that he had stents placed recently in his bladder and our concerns that the urine was the source of his bacteremia we may need to give him extended course of fluconazole prior to removal of stents in a few weeks   LOS: 9 days   Zachary Shannon 11/24/2020, 6:43 PM

## 2020-11-24 NOTE — Plan of Care (Signed)

## 2020-11-24 NOTE — Progress Notes (Signed)
OT Cancellation Note  Patient Details Name: Zachary Shannon. MRN: [id number] DOB: 1950/10/07   Cancelled Treatment:    Reason Eval/Treat Not Completed: Patient declined, no reason specified but stated a "hard morning". Pt did not elaborate but appeared very tired and pale.   Pt requested to just rest today and agreed for OT to check back in tomorrow. Will attempt tomorrow as able.   Julien Girt 11/24/2020, 1:31 PM

## 2020-11-24 NOTE — Progress Notes (Signed)
PROGRESS NOTE    Zachary Shannon.  SJG:[id number] DOB: Nov 28, 1950 DOA: 11/15/2020 PCP: Minna Antis, DO   Chief Complain: Flank/suprapubic pain/confusion  Brief Narrative: Patient is a 70 year old male with history of bladder tumor status post cystoprostatectomy on 4/6 with placement of ileal conduit/ stents, chronic systolic heart failure, paroxysmal A. fib, coronary disease, hyperlipidemia who presented to the emergency department with complaints of flank/suprapubic plan/confusion.  On presentation, was found to have severe AKI with creatinine of more than 6.  Baseline creatinine normal.  Patient was admitted for the management of severe renal failure.  CT stone study did not show any acute finding.  Started on IV antibiotics, IV fluids for possible pyelonephritis.  Nephrology consulted.  Urine culture showed multiple species.  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  ID ,neprhology following.  PT/OT recommended skilled nursing facility on discharge.  Plan for discharge tomorrow.  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  Presented with creatinine of more than 6.  Started on IV fluids, bicarb drip.  Nephrology, urology were following.  Kidney function improved but trended up again now again improving after he was started on Lasix.  There is also concern for fungal pyelonephritis. Monitor BMP  Candida sepsis:Present on admission. One Blood culture has shown Candida, currently on Eraxis.  ID following. TTE did not show any obvious vegetation.  Repeat blood cultures will be followed,NGTD.  I had a long discussion with Dr. Valetta Close, [facility] ophthalmology, today when I consulted him for a  ophthalmoscopy for ruling out endophthalmitis.  He suggested to follow-up as an outpatient because  only 1 blood culture is positive and the repeat blood cultures have been negative.Marland Kitchen  He requested calling back if the repeat blood culture sent on 11/21/2020 comes  to be positive. We will strongly suggest to follow-up with [facility] ophthalmology as soon as possible as an outpatient  Chest pressure: Resolved.  Complain of some chest pressure on 4/27.  Echocardiogram showed normal ejection fraction, no wall motion abnormality.  Troponins were unremarkable.  Cardiology signed off.  VQ scan was done to rule out PE, ruled out.  Back pain: MRI had some mild disc protrusion.  He is also discussed with Dr. Christella Noa, neurosurgery.  No plan for intervention.  Continue supportive care.  Paroxysmal atrial fibrillation: Currently rate is well controlled.  On Eliquis for anticoagulation.  On metoprolol for rate control.  History of systolic heart failure/coronary artery  disease: Currently stable.  History of ischemic coronary artery disease chronic systolic heart failure with ejection fraction of 20 to 25% but improved to 55-60% as per Echo done on 11/18/2020.  Follows with [facility] cardiology in [place]  Hyperkalemia: Resolved. treated with Lokelma, bicarb. .  Bladder cancer status post recent cystoprostatectomy: Status post ileal conduit, ureteral stent placement on 10/27/2020 by Dr Tresa Moore.  Urology was following.  There was some concern for fungal pyelo.  Leukocytosis improving.  Initially on ceftriaxone which has been changed to cefdinir,now stopped.  Hypotension: Resolved  Poor oral intake: Nutrition is following.  Confusion: Resolved  Deconditioning/debility: PT/OT recommended SNF on discharge.TOC following        Nutrition Problem: Inadequate oral intake Etiology: poor appetite  DVT prophylaxis:Eliquis Code Status: Full Family Communication: Daughter on phone on 11/24/20 Status is:  Inpatient  Remains inpatient appropriate because:Inpatient level of care appropriate due to severity of illness   Dispo: T Che patient is from: Home              Anticipated d/c is to: SNF              Patient currently is not medically stable to d/c.   Difficult to place patient No     Consultants: ID, cardiology, nephrology, urology  Procedures: None  Antimicrobials:  Anti-infectives (From admission, onward)   Start     Dose/Rate Route Frequency Ordered Stop   11/24/20 1000  fluconazole (DIFLUCAN) tablet 400 mg        400 mg Oral Daily 11/23/20 1324     11/22/20 1000  anidulafungin (ERAXIS) 100 mg in sodium chloride 0.9 % 100 mL IVPB  Status:  Discontinued        100 mg 78 mL/hr over 100 Minutes Intravenous Every 24 hours 11/21/20 1143 11/23/20 1324   11/21/20 2200  cefdinir (OMNICEF) capsule 300 mg  Status:  Discontinued        300 mg Oral Every 12 hours 11/21/20 1815 11/21/20 1816   11/21/20 2200  cefdinir (OMNICEF) capsule 300 mg  Status:  Discontinued        300 mg Oral Daily at bedtime 11/21/20 1816 11/22/20 0846   11/21/20 1230  anidulafungin (ERAXIS) 200 mg in sodium chloride 0.9 % 200 mL IVPB        200 mg 78 mL/hr over 200 Minutes Intravenous  Once 11/21/20 1142 11/21/20 1723   11/17/20 1300  cefTRIAXone (ROCEPHIN) 2 g in sodium chloride 0.9 % 100 mL IVPB  Status:  Discontinued        2 g 200 mL/hr over 30 Minutes Intravenous Daily 11/17/20 1202 11/21/20 1815      Subjective: Patient seen and examined the bedside this morning.  Hemodynamically stable.  He looks better today.  He feels more comfortable.  He is having good urine output on the Foley bag.  Denies any complaints.  More alert and awake and communicative  Objective: Vitals:   11/23/20 1337 11/23/20 2113 11/24/20 0528 11/24/20 0908  BP: 121/83 129/86 126/79 121/79  Pulse: 85 85 92 83  Resp: 18 18 16    Temp: 98.4 F (36.9 C) 98.3 F (36.8 C) 98 F (36.7 C)   TempSrc: Oral Oral Oral   SpO2: 96%  96% 95%   Weight:   115.3 kg   Height:        Intake/Output Summary (Last 24 hours) at 11/24/2020 1326 Last data filed at 11/24/2020 1236 Gross per 24 hour  Intake 180 ml  Output 5865 ml  Net -5685 ml   Filed Weights   11/23/20 0519 11/23/20 0924 11/24/20 0528  Weight: 119.1 kg 119.1 kg 115.3 kg    Examination:  General exam: chronically ill looking, not in distress, very deconditioned, debilitated HEENT: PERRL Respiratory system:  no wheezes or crackles  Cardiovascular system: S1 & S2 heard, RRR.  Gastrointestinal system: Abdomen is nondistended, soft and nontender.  Right lower quadrant urostomy Central nervous system: Alert and oriented Extremities: No edema, no clubbing ,no cyanosis Skin: No rashes, no ulcers,no icterus    Data Reviewed: I have personally reviewed following labs and imaging studies  CBC: Recent Labs  Lab 11/19/20 0235 11/20/20 0239 11/21/20 1258 11/23/20 0529 11/24/20 0521  WBC 13.5* 13.5* 14.1*  13.4* 13.4*  NEUTROABS  --   --  11.2* 10.0* 10.5*  HGB 11.3* 11.1* 10.7* 10.4* 10.6*  HCT 35.1* 35.3* 33.3* 33.7* 34.2*  MCV 100.6* 102.3* 100.9* 103.1* 102.1*  PLT 340 349 337 325 [id number]   Basic Metabolic Panel: Recent Labs  Lab 11/18/20 0245 11/19/20 0235 11/20/20 0239 11/21/20 1258 11/22/20 0545 11/23/20 0529 11/24/20 0521  NA 132*   < > 138 136 141 139 142  K 5.1   < > 4.9 5.6* 5.5* 5.1 4.7  CL 101   < > 105 106 109 108 105  CO2 21*   < > 23 23 23 22 27   GLUCOSE 105*   < > 105* 112* 101* 95 94  BUN 90*   < > 73* 74* 74* 67* 64*  CREATININE 4.17*   < > 3.11* 3.87* 3.95* 3.78* 3.47*  CALCIUM 8.0*   < > 8.2* 8.1* 8.3* 8.0* 8.4*  MG  --   --   --  2.3  --   --   --   PHOS 4.5  --   --  4.1 4.2  --   --    < > = values in this interval not displayed.   GFR: Estimated Creatinine Clearance: 26 mL/min (A) (by C-G formula based on SCr of 3.47 mg/dL (H)). Liver Function Tests: Recent Labs  Lab 11/18/20 0245 11/21/20 1258 11/22/20 0545   ALBUMIN 2.6* 2.0* 2.0*   No results for input(s): LIPASE, AMYLASE in the last 168 hours. No results for input(s): AMMONIA in the last 168 hours. Coagulation Profile: No results for input(s): INR, PROTIME in the last 168 hours. Cardiac Enzymes: No results for input(s): CKTOTAL, CKMB, CKMBINDEX, TROPONINI in the last 168 hours. BNP (last 3 results) No results for input(s): PROBNP in the last 8760 hours. HbA1C: No results for input(s): HGBA1C in the last 72 hours. CBG: Recent Labs  Lab 11/23/20 1211 11/23/20 1746 11/23/20 2116 11/24/20 0739 11/24/20 1208  GLUCAP 133* 115* 84 93 104*   Lipid Profile: No results for input(s): CHOL, HDL, LDLCALC, TRIG, CHOLHDL, LDLDIRECT in the last 72 hours. Thyroid Function Tests: No results for input(s): TSH, T4TOTAL, FREET4, T3FREE, THYROIDAB in the last 72 hours. Anemia Panel: No results for input(s): VITAMINB12, FOLATE, FERRITIN, TIBC, IRON, RETICCTPCT in the last 72 hours. Sepsis Labs: No results for input(s): PROCALCITON, LATICACIDVEN in the last 168 hours.  Recent Results (from the past 240 hour(s))  Resp Panel by RT-PCR (Flu A&B, Covid) Nasopharyngeal Swab     Status: None   Collection Time: 11/15/20  6:33 PM   Specimen: Nasopharyngeal Swab; Nasopharyngeal(NP) swabs in vial transport medium  Result Value Ref Range Status   SARS Coronavirus 2 by RT PCR NEGATIVE NEGATIVE Final    Comment: (NOTE) SARS-CoV-2 target nucleic acids are NOT DETECTED.  The SARS-CoV-2 RNA is generally detectable in upper respiratory specimens during the acute phase of infection. The lowest concentration of SARS-CoV-2 viral copies this assay can detect is 138 copies/mL. A negative result does not preclude SARS-Cov-2 infection and should not be used as the sole basis for treatment or other patient management decisions. A negative result may occur with  improper specimen collection/handling, submission of specimen other than nasopharyngeal swab, presence of  viral mutation(s) within the areas targeted by this assay, and inadequate number of viral copies(<138 copies/mL). A negative result must be combined with clinical observations, patient history, and epidemiological information. The expected result is Negative.  Fact Sheet for Patients:  EntrepreneurPulse.com.au  Fact Sheet for Healthcare Providers:  IncredibleEmployment.be  This test is no t yet approved or cleared by the [place] FDA and  has been authorized for detection and/or diagnosis of SARS-CoV-2 by FDA under an Emergency Use Authorization (EUA). This EUA will remain  in effect (meaning this test can be used) for the duration of the COVID-19 declaration under Section 564(b)(1) of the Act, 21 U.S.C.section 360bbb-3(b)(1), unless the authorization is terminated  or revoked sooner.       Influenza A by PCR NEGATIVE NEGATIVE Final   Influenza B by PCR NEGATIVE NEGATIVE Final    Comment: (NOTE) The Xpert Xpress SARS-CoV-2/FLU/RSV plus assay is intended as an aid in the diagnosis of influenza from Nasopharyngeal swab specimens and should not be used as a sole basis for treatment. Nasal washings and aspirates are unacceptable for Xpert Xpress SARS-CoV-2/FLU/RSV testing.  Fact Sheet for Patients: EntrepreneurPulse.com.au  Fact Sheet for Healthcare Providers: IncredibleEmployment.be  This test is not yet approved or cleared by the [place] FDA and has been authorized for detection and/or diagnosis of SARS-CoV-2 by FDA under an Emergency Use Authorization (EUA). This EUA will remain in effect (meaning this test can be used) for the duration of the COVID-19 declaration under Section 564(b)(1) of the Act, 21 U.S.C. section 360bbb-3(b)(1), unless the authorization is terminated or revoked.  Performed at [facility], [place] [address], [place], [place] [address]   Culture, blood  (routine x 2)     Status: Abnormal (Preliminary result)   Collection Time: 11/17/20 12:47 PM   Specimen: BLOOD  Result Value Ref Range Status   Specimen Description   Final    BLOOD RIGHT ANTECUBITAL Performed at [facility] [address], [place], [place] [address]    Special Requests   Final    BOTTLES DRAWN AEROBIC AND ANAEROBIC Blood Culture adequate volume Performed at [facility] [address], [place], [place] [address]    Culture  Setup Time   Final    YEAST AEROBIC BOTTLE ONLY CRITICAL RESULT CALLED TO, READ BACK BY AND VERIFIED WITH: TERRY GREEN PHARMD @1002  11/21/20 EB    Culture (A)  Final    CANDIDA ALBICANS Sent to [facility] for further susceptibility testing. Performed at [facility], [place] [address], [place], [place] [address]    Report Status PENDING  Incomplete  Culture, Urine     Status: Abnormal   Collection Time: 11/17/20  1:40 PM   Specimen: Urine, Random  Result Value Ref Range Status   Specimen Description   Final    URINE, RANDOM Performed at [facility] [address], [place], [place] [address]    Special Requests   Final    NONE Performed at [facility], [place] [address] [place], [place] [address]    Culture MULTIPLE SPECIES PRESENT, SUGGEST RECOLLECTION (A)  Final   Report Status 11/19/2020 FINAL  Final  MRSA PCR Screening     Status: None   Collection Time: 11/17/20  3:49 PM   Specimen: Nasal Mucosa; Nasopharyngeal  Result Value Ref Range Status   MRSA by PCR NEGATIVE NEGATIVE Final    Comment:        The GeneXpert MRSA Assay (FDA approved for NASAL specimens only), is one component of a comprehensive MRSA colonization surveillance program. It is not intended to diagnose MRSA infection nor to guide or monitor treatment for MRSA infections. Performed at [facility], [place]  [address] [place], [place] [address]   Culture, blood  (Routine X 2) w Reflex to ID Panel     Status: None (Preliminary result)   Collection Time: 11/21/20 12:58 PM   Specimen: BLOOD LEFT HAND  Result Value Ref Range Status   Specimen Description   Final    BLOOD LEFT HAND Performed at [facility] [address], [place], [place] [address]    Special Requests   Final    BOTTLES DRAWN AEROBIC AND ANAEROBIC Blood Culture adequate volume Performed at [facility] [address] [place], [place] [address]    Culture   Final    NO GROWTH 3 DAYS Performed at [facility], [place] [address] [place], [place] [address]    Report Status PENDING  Incomplete  Culture, blood (Routine X 2) w Reflex to ID Panel     Status: None (Preliminary result)   Collection Time: 11/21/20 12:58 PM   Specimen: BLOOD RIGHT HAND  Result Value Ref Range Status   Specimen Description   Final    BLOOD RIGHT HAND Performed at [facility] [address] [place],  [address]    Special Requests   Final    BOTTLES DRAWN AEROBIC AND ANAEROBIC Blood Culture adequate volume Performed at [facility] [address], [place],  [address]    Culture   Final    NO GROWTH 3 DAYS Performed at [facility], [place] [address] [place],  [address]    Report Status PENDING  Incomplete         Radiology Studies: DG Chest 2 View  Result Date: 11/23/2020 CLINICAL DATA:  Volume axis. EXAM: CHEST - 2 VIEW COMPARISON:  No prior. FINDINGS: Cardiomegaly with mild pulmonary vascular prominence. Low lung volumes with mild bibasilar atelectasis. Small bilateral pleural effusions cannot be excluded. No pneumothorax. IMPRESSION: Cardiomegaly with mild pulmonary venous congestion. Low lung volumes with mild bibasilar atelectasis. Small bilateral pleural effusions cannot be excluded. Electronically Signed   By: Marcello Moores  Register   On: 11/23/2020 15:28   US RENAL  Result Date:  11/23/2020 CLINICAL DATA:  Acute kidney injury EXAM: RENAL / URINARY TRACT ULTRASOUND COMPLETE COMPARISON:  None. FINDINGS: Right Kidney: Renal measurements: 12.6 x 6.0 x 6.1 cm = volume: 240.6 mL. Echogenicity is mildly increased. Renal cortical thickness normal. No mass, perinephric fluid, or hydronephrosis visualized. No sonographically demonstrable calculus or ureterectasis. Left Kidney: Renal measurements: 12.1 x 6.5 x 6.2 cm = volume: 255.8 mL. Echogenicity is mildly increased. Renal cortical thickness normal. No mass, perinephric fluid, or hydronephrosis visualized. No sonographically demonstrable calculus or ureterectasis. Bladder: Appears normal for degree of bladder distention. Other: None. IMPRESSION: Mild increase in renal echogenicity, a finding likely indicative of underlying medical renal disease. Study otherwise unremarkable. Note that there is no obstructing focus in either kidney. Electronically Signed   By: Lowella Grip III M.D.   On: 11/23/2020 14:17        Scheduled Meds: . apixaban  5 mg Oral BID  . atorvastatin  10 mg Oral QHS  . Chlorhexidine Gluconate Cloth  6 each Topical Daily  . diltiazem  30 mg Oral Q8H  . docusate sodium  100 mg Oral Daily  . fluconazole  400 mg Oral Daily  . furosemide  80 mg Intravenous BID  . insulin aspart  0-5 Units Subcutaneous QHS  . insulin aspart  0-9 Units Subcutaneous TID WC  . lidocaine  1  patch Transdermal Q24H  . mouth rinse  15 mL Mouth Rinse BID  . metoprolol tartrate  25 mg Oral BID  . multivitamin with minerals  1 tablet Oral Daily  . pantoprazole  40 mg Oral Daily  . sodium bicarbonate  1,300 mg Oral BID   Continuous Infusions:    LOS: 9 days    Time spent:35 mins. More than 50% of that time was spent in counseling and/or coordination of care.      Shelly Coss, MD [facility] P5/10/2020, 1:26 PM

## 2020-11-24 NOTE — Progress Notes (Signed)
[facility] Progress Note  Subjective:  pt seen in room, feeling better today. BP's good, -2.3 L w/ IV lasix bid. Creat down slightly. Seen by urology, noted no sx's of obstruction, suspecting intrinsic renal issue poss related to fungal pyelonephritis.    Vitals:   11/23/20 1337 11/23/20 2113 11/24/20 0528 11/24/20 0908  BP: 121/83 129/86 126/79 121/79  Pulse: 85 85 92 83  Resp: 18 18 16    Temp: 98.4 F (36.9 C) 98.3 F (36.8 C) 98 F (36.7 C)   TempSrc: Oral Oral Oral   SpO2: 96% 96% 95%   Weight:   115.3 kg   Height:        Exam: General: obese man sleeping peacefully Heart: irreg irreg on moniotor Lungs: normal WOB, clear to bases, on RA Extremities: 2+ LE and 1+ UE edema bilat GU: clear dark brownish urine in foley bag which is attached to urostomy      ECHO 11/18/20- LVEF 55-60%, no wma's. RV fxn slightly reduced, RV mildly enlarged, moderate pulm htn.  LA/ RA mod to severely dilated.     CXR 4/27 - IMPRESSION: Mild cardiomegaly. No edema or airspace opacity. Aortic Atherosclerosis (ICD10-I70.0).   CT renal stone study 4/25 - Adrenals/Urinary Tract: Normal adrenal glands. Bilateral ureteric stents again identified. The left-sided stent originates in an extrarenal pelvis. No renal calculi or hydronephrosis. Cystectomy and ileal conduit creation. 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   IV abx here :sp IV rocephin x 5 d and cefdinir po x1. on anidulafungin IV now    UA 5/3 - cloudy, 100 prot, +LE/ nitrite, many bact, >50 wbc/ rbc   UNa 40  UCr 75    CXR 5/-3 - mild vasc congestion  Assessment/ Plan: 1. AKI - baseline creatinine 1.0- 1.13 from dec 2021 and early April 2022. Creat 1.1 at time of cystectomy and 1.09 at dc 4/11. This admit 4/25 pt presented w/ AKI creat 6.4, we felt this was likely due to hypovolemia + ACEi. Urology saw pt and noted no hydro and good bilat ureteral stent placement by imaging. Creat improved down to 3.1 on 4/30, since then  has been stuck in the mid 3's. Seen by urology, no sign of obstruction, suspected intrinsic renal issue poss related to fungal pyelo. Agree. Nonoliguric, making good urine w/ IV lasix day #2. Cont lasix. Will follow.  2. Bladder cancer-  status post cystoprostatectomy 10/27/2020 with ileal conduit 3. BP/ volume - bp's wnl, getting BB and CCB for afib. IV lasix now for ^vol / edema. CXR yest w/o edema, mild congestion. 4. A. Fib- on apixaban, BB and diltiazem. BP's wnl.  5. Hyperkalemia - mild, renal diet 5. Metabolic acidosis- likely related to #1 and #2, resolved with bicarb supplementation 6. H/o systolic CHF / CAD    Rob Nikola Blackston 11/24/2020, 11:18 AM   Recent Labs  Lab 11/21/20 1258 11/22/20 0545 11/23/20 0529 11/24/20 0521  K 5.6* 5.5* 5.1 4.7  BUN 74* 74* 67* 64*  CREATININE 3.87* 3.95* 3.78* 3.47*  CALCIUM 8.1* 8.3* 8.0* 8.4*  PHOS 4.1 4.2  --   --   HGB 10.7*  --  10.4* 10.6*   Inpatient medications: . apixaban  5 mg Oral BID  . atorvastatin  10 mg Oral QHS  . Chlorhexidine Gluconate Cloth  6 each Topical Daily  . diltiazem  30 mg Oral Q8H  . docusate sodium  100 mg Oral Daily  . fluconazole  400 mg Oral Daily  .  furosemide  80 mg Intravenous BID  . insulin aspart  0-5 Units Subcutaneous QHS  . insulin aspart  0-9 Units Subcutaneous TID WC  . lidocaine  1 patch Transdermal Q24H  . mouth rinse  15 mL Mouth Rinse BID  . metoprolol tartrate  25 mg Oral BID  . multivitamin with minerals  1 tablet Oral Daily  . pantoprazole  40 mg Oral Daily  . sodium bicarbonate  1,300 mg Oral BID    acetaminophen **OR** acetaminophen, calcium carbonate (dosed in mg elemental calcium), camphor-menthol **AND** hydrOXYzine, docusate sodium, hydrALAZINE, labetalol, nitroGLYCERIN, ondansetron **OR** ondansetron (ZOFRAN) IV, sorbitol

## 2020-11-25 DIAGNOSIS — B377 Candidal sepsis: Secondary | ICD-10-CM | POA: Diagnosis not present

## 2020-11-25 LAB — CBC WITH DIFFERENTIAL/PLATELET
Abs Immature Granulocytes: 0.4 10*3/uL — ABNORMAL HIGH (ref 0.00–0.07)
Basophils Absolute: 0.1 10*3/uL (ref 0.0–0.1)
Basophils Relative: 1 %
Eosinophils Absolute: 0.4 10*3/uL (ref 0.0–0.5)
Eosinophils Relative: 3 %
HCT: 38.3 % — ABNORMAL LOW (ref 39.0–52.0)
Hemoglobin: 12.1 g/dL — ABNORMAL LOW (ref 13.0–17.0)
Immature Granulocytes: 4 %
Lymphocytes Relative: 8 %
Lymphs Abs: 0.9 10*3/uL (ref 0.7–4.0)
MCH: 31.6 pg (ref 26.0–34.0)
MCHC: 31.6 g/dL (ref 30.0–36.0)
MCV: 100 fL (ref 80.0–100.0)
Monocytes Absolute: 0.9 10*3/uL (ref 0.1–1.0)
Monocytes Relative: 8 %
Neutro Abs: 8.5 10*3/uL — ABNORMAL HIGH (ref 1.7–7.7)
Neutrophils Relative %: 76 %
Platelets: 367 10*3/uL (ref 150–400)
RBC: 3.83 MIL/uL — ABNORMAL LOW (ref 4.22–5.81)
RDW: 15.1 % (ref 11.5–15.5)
WBC: 11.2 10*3/uL — ABNORMAL HIGH (ref 4.0–10.5)
nRBC: 0 % (ref 0.0–0.2)

## 2020-11-25 LAB — GLUCOSE, CAPILLARY
Glucose-Capillary: 89 mg/dL (ref 70–99)
Glucose-Capillary: 111 mg/dL — ABNORMAL HIGH (ref 70–99)
Glucose-Capillary: 99 mg/dL (ref 70–99)

## 2020-11-25 LAB — BASIC METABOLIC PANEL
Anion gap: 15 (ref 5–15)
BUN: 60 mg/dL — ABNORMAL HIGH (ref 8–23)
CO2: 27 mmol/L (ref 22–32)
Calcium: 8.9 mg/dL (ref 8.9–10.3)
Chloride: 101 mmol/L (ref 98–111)
Creatinine, Ser: 2.24 mg/dL — ABNORMAL HIGH (ref 0.61–1.24)
GFR, Estimated: 31 mL/min — ABNORMAL LOW (ref >60)
Glucose, Bld: 96 mg/dL (ref 70–99)
Potassium: 4.6 mmol/L (ref 3.5–5.1)
Sodium: 143 mmol/L (ref 135–145)

## 2020-11-25 MED ORDER — LIDOCAINE 5 % EX PTCH: 1.0000 | MEDICATED_PATCH | CUTANEOUS | 0 refills | Status: DC

## 2020-11-25 MED ORDER — METHOCARBAMOL 500 MG PO TABS
500.0000 mg | ORAL_TABLET | Freq: Four times a day (QID) | ORAL | Status: DC | PRN
  Administered 2020-11-25: 500 mg via ORAL
  Filled 2020-11-25: qty 1

## 2020-11-25 MED ORDER — SODIUM BICARBONATE 650 MG PO TABS: 1300.0000 mg | ORAL_TABLET | Freq: Two times a day (BID) | ORAL | Status: DC

## 2020-11-25 MED ORDER — FUROSEMIDE 40 MG PO TABS: 40.0000 mg | ORAL_TABLET | Freq: Every day | ORAL | 0 refills | Status: DC

## 2020-11-25 MED ORDER — METHOCARBAMOL 500 MG PO TABS: 500.0000 mg | ORAL_TABLET | Freq: Four times a day (QID) | ORAL | Status: DC | PRN

## 2020-11-25 MED ORDER — FLUCONAZOLE 200 MG PO TABS: 200.0000 mg | ORAL_TABLET | Freq: Every day | ORAL | Status: DC

## 2020-11-25 MED ORDER — FLUCONAZOLE 200 MG PO TABS: 400.0000 mg | ORAL_TABLET | Freq: Every day | ORAL | 0 refills | Status: AC

## 2020-11-25 MED ORDER — PANTOPRAZOLE SODIUM 40 MG PO TBEC: 40.0000 mg | DELAYED_RELEASE_TABLET | Freq: Every day | ORAL | Status: DC

## 2020-11-25 MED ORDER — OXYCODONE-ACETAMINOPHEN 5-325 MG PO TABS: 0.5000 | ORAL_TABLET | Freq: Four times a day (QID) | ORAL | 0 refills | Status: AC | PRN

## 2020-11-25 MED ORDER — FLUCONAZOLE 100 MG PO TABS: 200.0000 mg | ORAL_TABLET | Freq: Every day | ORAL | Status: DC

## 2020-11-25 MED ORDER — DOCUSATE SODIUM 100 MG PO CAPS
100.0000 mg | ORAL_CAPSULE | Freq: Every day | ORAL | 0 refills | Status: DC
Start: 2020-11-26 — End: 2021-12-08

## 2020-11-25 NOTE — Progress Notes (Signed)
Patient discharged to [facility]  via [facility], report called to [facility].

## 2020-11-25 NOTE — TOC Transition Note (Signed)
Transition of Care [facility]) - CM/SW Discharge Note   Patient Details  Name: Zachary Shannon. MRN: [id number] Date of Birth: 1950/09/18  Transition of Care [facility]) CM/SW Contact:  Lucy Woolever, Marjie Skiff, RN Phone Number: 11/25/2020, 12:58 PM   Clinical Narrative:     [facility] SN[facility] bed for pt today. Daughter Eustaquio Maize contacted to inform of DC today.  Pt to transport to [facility]. RN to call report to [phone].  Final next level of care: Skilled Nursing Facility Barriers to Discharge: No Barriers Identified   Patient Goals and CMS Choice Patient states their goals for this hospitalization and ongoing recovery are:: to go home CMS Medicare.gov Compare Post Acute Care list provided to:: Patient Represenative (must comment) (Daughter Beth) Choice offered to / list presented to : Adult Children  Discharge Placement              Patient chooses bed at: [facility] Patient to be transferred to facility by: [facility] Name of family member notified: Beth Patient and family notified of of transfer: 11/25/20  Discharge Plan and Services   Discharge Planning Services: CM Consult Post Acute Care Choice: [place]           Readmission Risk Interventions No flowsheet data found.

## 2020-11-25 NOTE — Care Management Important Message (Signed)
Important Message  Patient Details IM Letter given to the Patient. Name: Zachary Shannon. MRN: [id number] Date of Birth: May 20, 1951   Medicare Important Message Given:  Yes     Kerin Salen 11/25/2020, 11:42 AM

## 2020-11-25 NOTE — Progress Notes (Signed)
[facility] Progress Note  Subjective:  Excellent diuresis overnight, 6 L UOP, creat down 2.2 today. Going home.    Vitals:   11/24/20 0908 11/24/20 1342 11/24/20 1954 11/25/20 0519  BP: 121/79 116/77 118/75 (!) 122/92  Pulse: 83 87 86 82  Resp:  18 16 16   Temp:  98.1 F (36.7 C) 98.1 F (36.7 C) 97.9 F (36.6 C)  TempSrc:  Oral Oral Oral  SpO2:  92% 93% 95%  Weight:    113.3 kg  Height:        Exam: General: obese man, nad Heart: irreg irreg on moniotor Lungs: normal WOB, clear to bases, on RA Extremities: resolved edema GU: urine draining into bag attached to ostomy       ECHO 11/18/20- LVEF 55-60%, no wma's. RV fxn slightly reduced, RV mildly enlarged, moderate pulm htn.  LA/ RA mod to severely dilated.     CXR 4/27 - IMPRESSION: Mild cardiomegaly. No edema or airspace opacity. Aortic Atherosclerosis (ICD10-I70.0).   CT renal stone study 4/25 - Adrenals/Urinary Tract: Normal adrenal glands. Bilateral ureteric stents again identified. The left-sided stent originates in an extrarenal pelvis. No renal calculi or hydronephrosis. Cystectomy and ileal conduit creation. 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   IV abx here :sp IV rocephin x 5 d and cefdinir po x1. on anidulafungin IV now    UA 5/3 - cloudy, 100 prot, +LE/ nitrite, many bact, >50 wbc/ rbc   UNa 40  UCr 75    CXR 5/-3 - mild vasc congestion  Assessment/ Plan: 1. AKI - baseline creatinine 1.0- 1.13 from dec 2021 and early April 2022. Creat 1.1 at time of cystectomy and 1.09 at dc 4/11. This admit 4/25 pt presented w/ AKI creat 6.4, we felt this was likely due to hypovolemia + ACEi. Urology saw pt and noted no hydro and good bilat ureteral stent placement by imaging. Creat improved down to 3.1 on 4/30, since was stuck mid 3's. Started IV lasix and had large diuresis over 2- 3 days almost 10 kg down and creat improved to 2.2 today, day of dc. Recommend po lasix 40 daily x 5 days at dc. We will  contact him for one-time renal f/u appt in 3-5 weeks.  2. Bladder cancer-  status post cystoprostatectomy 10/27/2020 with ileal conduit 3. BP/ volume - bp's wnl, getting BB and CCB for afib. Vol overload mostly resolved w/ diuresis 4. A. Fib- on apixaban, BB and diltiazem. BP's wnl.  5. Hyperkalemia - mild, renal diet 5. Metabolic acidosis- likely related to #1 and #2, resolved with bicarb supplementation 6. H/o systolic CHF / CAD    Zachary Shannon 11/25/2020, 8:52 AM   Recent Labs  Lab 11/21/20 1258 11/22/20 0545 11/23/20 0529 11/24/20 0521 11/25/20 0551  K 5.6* 5.5*   < > 4.7 4.6  BUN 74* 74*   < > 64* 60*  CREATININE 3.87* 3.95*   < > 3.47* 2.24*  CALCIUM 8.1* 8.3*   < > 8.4* 8.9  PHOS 4.1 4.2  --   --   --   HGB 10.7*  --    < > 10.6* 12.1*   < > = values in this interval not displayed.   Inpatient medications: . apixaban  5 mg Oral BID  . atorvastatin  10 mg Oral QHS  . Chlorhexidine Gluconate Cloth  6 each Topical Daily  . diltiazem  30 mg Oral Q8H  . docusate sodium  100 mg Oral Daily  .  fluconazole  400 mg Oral Daily  . furosemide  80 mg Intravenous BID  . insulin aspart  0-5 Units Subcutaneous QHS  . insulin aspart  0-9 Units Subcutaneous TID WC  . lidocaine  1 patch Transdermal Q24H  . mouth rinse  15 mL Mouth Rinse BID  . metoprolol tartrate  25 mg Oral BID  . multivitamin with minerals  1 tablet Oral Daily  . pantoprazole  40 mg Oral Daily  . sodium bicarbonate  1,300 mg Oral BID    acetaminophen **OR** acetaminophen, calcium carbonate (dosed in mg elemental calcium), camphor-menthol **AND** hydrOXYzine, docusate sodium, hydrALAZINE, labetalol, nitroGLYCERIN, ondansetron **OR** ondansetron (ZOFRAN) IV, sorbitol

## 2020-11-25 NOTE — Progress Notes (Addendum)
Subjective:  Patient feels better   Antibiotics:  Anti-infectives (From admission, onward)   Start     Dose/Rate Route Frequency Ordered Stop   12/06/20 1000  fluconazole (DIFLUCAN) tablet 200 mg        200 mg Oral Daily 11/25/20 0902     12/06/20 0000  fluconazole (DIFLUCAN) 200 MG tablet       Note to Pharmacy: Continue taking until seen by ID   200 mg Oral Daily 11/25/20 1135     11/26/20 0000  fluconazole (DIFLUCAN) 200 MG tablet        400 mg Oral Daily 11/25/20 1135 12/06/20 2359   11/24/20 1000  fluconazole (DIFLUCAN) tablet 400 mg        400 mg Oral Daily 11/23/20 1324 12/05/20 2359   11/22/20 1000  anidulafungin (ERAXIS) 100 mg in sodium chloride 0.9 % 100 mL IVPB  Status:  Discontinued        100 mg 78 mL/hr over 100 Minutes Intravenous Every 24 hours 11/21/20 1143 11/23/20 1324   11/21/20 2200  cefdinir (OMNICEF) capsule 300 mg  Status:  Discontinued        300 mg Oral Every 12 hours 11/21/20 1815 11/21/20 1816   11/21/20 2200  cefdinir (OMNICEF) capsule 300 mg  Status:  Discontinued        300 mg Oral Daily at bedtime 11/21/20 1816 11/22/20 0846   11/21/20 1230  anidulafungin (ERAXIS) 200 mg in sodium chloride 0.9 % 200 mL IVPB        200 mg 78 mL/hr over 200 Minutes Intravenous  Once 11/21/20 1142 11/21/20 1723   11/17/20 1300  cefTRIAXone (ROCEPHIN) 2 g in sodium chloride 0.9 % 100 mL IVPB  Status:  Discontinued        2 g 200 mL/hr over 30 Minutes Intravenous Daily 11/17/20 1202 11/21/20 1815      Medications: Scheduled Meds: . apixaban  5 mg Oral BID  . atorvastatin  10 mg Oral QHS  . Chlorhexidine Gluconate Cloth  6 each Topical Daily  . diltiazem  30 mg Oral Q8H  . docusate sodium  100 mg Oral Daily  . [START ON 12/06/2020] fluconazole  200 mg Oral Daily  . fluconazole  400 mg Oral Daily  . furosemide  80 mg Intravenous BID  . insulin aspart  0-5 Units Subcutaneous QHS  . insulin aspart  0-9 Units Subcutaneous TID WC  . lidocaine  1 patch  Transdermal Q24H  . mouth rinse  15 mL Mouth Rinse BID  . metoprolol tartrate  25 mg Oral BID  . multivitamin with minerals  1 tablet Oral Daily  . pantoprazole  40 mg Oral Daily  . sodium bicarbonate  1,300 mg Oral BID   Continuous Infusions:  PRN Meds:.acetaminophen **OR** acetaminophen, calcium carbonate (dosed in mg elemental calcium), camphor-menthol **AND** hydrOXYzine, docusate sodium, hydrALAZINE, labetalol, methocarbamol, nitroGLYCERIN, ondansetron **OR** ondansetron (ZOFRAN) IV, sorbitol    Objective: Weight change: -5.8 kg  Intake/Output Summary (Last 24 hours) at 11/25/2020 1420 Last data filed at 11/25/2020 0520 Gross per 24 hour  Intake --  Output 3400 ml  Net -3400 ml   Blood pressure 111/78, pulse 86, temperature 97.9 F (36.6 C), temperature source Oral, resp. rate 17, height 6' (1.829 m), weight 113.3 kg, SpO2 92 %. Temp:  [97.9 F (36.6 C)-98.1 F (36.7 C)] 97.9 F (36.6 C) (05/05 1321) Pulse Rate:  [82-86] 86 (05/05 1321) Resp:  [16-17] 17 (05/05  1321) BP: (111-122)/(75-92) 111/78 (05/05 1321) SpO2:  [92 %-95 %] 92 % (05/05 1321) Weight:  [113.3 kg] 113.3 kg (05/05 0519)  Physical Exam: Physical Exam Constitutional:      Appearance: He is well-developed.  HENT:     Head: Normocephalic and atraumatic.  Eyes:     Conjunctiva/sclera: Conjunctivae normal.  Cardiovascular:     Rate and Rhythm: Normal rate and regular rhythm.     Heart sounds: No murmur heard. No friction rub. No gallop.   Pulmonary:     Effort: Pulmonary effort is normal. No respiratory distress.     Breath sounds: No stridor. No wheezing.  Abdominal:     General: There is no distension.     Palpations: Abdomen is soft.    Musculoskeletal:        General: Normal range of motion.     Cervical back: Normal range of motion and neck supple.  Skin:    General: Skin is warm and dry.     Findings: No erythema or rash.  Neurological:     Mental Status: He is alert and oriented to  person, place, and time.  Psychiatric:        Behavior: Behavior normal.        Thought Content: Thought content normal.        Judgment: Judgment normal.      CBC:    BMET Recent Labs    11/24/20 0521 11/25/20 0551  NA 142 143  K 4.7 4.6  CL 105 101  CO2 27 27  GLUCOSE 94 96  BUN 64* 60*  CREATININE 3.47* 2.24*  CALCIUM 8.4* 8.9     Liver Panel  No results for input(s): PROT, ALBUMIN, AST, ALT, ALKPHOS, BILITOT, BILIDIR, IBILI in the last 72 hours.     Sedimentation Rate No results for input(s): ESRSEDRATE in the last 72 hours. C-Reactive Protein No results for input(s): CRP in the last 72 hours.  Micro Results: Recent Results (from the past 720 hour(s))  SARS CORONAVIRUS 2 (TAT 6-24 HRS) Nasopharyngeal Nasopharyngeal Swab     Status: None   Collection Time: 10/26/20  2:24 PM   Specimen: Nasopharyngeal Swab  Result Value Ref Range Status   SARS Coronavirus 2 NEGATIVE NEGATIVE Final    Comment: (NOTE) SARS-CoV-2 target nucleic acids are NOT DETECTED.  The SARS-CoV-2 RNA is generally detectable in upper and lower respiratory specimens during the acute phase of infection. Negative results do not preclude SARS-CoV-2 infection, do not rule out co-infections with other pathogens, and should not be used as the sole basis for treatment or other patient management decisions. Negative results must be combined with clinical observations, patient history, and epidemiological information. The expected result is Negative.  Fact Sheet for Patients: SugarRoll.be  Fact Sheet for Healthcare Providers: https://www.woods-mathews.com/  This test is not yet approved or cleared by the [place] FDA and  has been authorized for detection and/or diagnosis of SARS-CoV-2 by FDA under an Emergency Use Authorization (EUA). This EUA will remain  in effect (meaning this test can be used) for the duration of the COVID-19 declaration  under Se ction 564(b)(1) of the Act, 21 U.S.C. section 360bbb-3(b)(1), unless the authorization is terminated or revoked sooner.  Performed at [facility], [place] [address], [place], [place] [address]   Surgical pcr screen     Status: None   Collection Time: 10/27/20  7:25 AM   Specimen: Nasal Mucosa; Nasal Swab  Result Value Ref Range Status  MRSA, PCR NEGATIVE NEGATIVE Final   Staphylococcus aureus NEGATIVE NEGATIVE Final    Comment: (NOTE) The Xpert SA Assay (FDA approved for NASAL specimens in patients 81 years of age and older), is one component of a comprehensive surveillance program. It is not intended to diagnose infection nor to guide or monitor treatment. Performed at [facility], [place] [address] [place], [place] [address]   Resp Panel by RT-PCR (Flu A&B, Covid) Nasopharyngeal Swab     Status: None   Collection Time: 11/15/20  6:33 PM   Specimen: Nasopharyngeal Swab; Nasopharyngeal(NP) swabs in vial transport medium  Result Value Ref Range Status   SARS Coronavirus 2 by RT PCR NEGATIVE NEGATIVE Final    Comment: (NOTE) SARS-CoV-2 target nucleic acids are NOT DETECTED.  The SARS-CoV-2 RNA is generally detectable in upper respiratory specimens during the acute phase of infection. The lowest concentration of SARS-CoV-2 viral copies this assay can detect is 138 copies/mL. A negative result does not preclude SARS-Cov-2 infection and should not be used as the sole basis for treatment or other patient management decisions. A negative result may occur with  improper specimen collection/handling, submission of specimen other than nasopharyngeal swab, presence of viral mutation(s) within the areas targeted by this assay, and inadequate number of viral copies(<138 copies/mL). A negative result must be combined with clinical observations, patient history, and epidemiological information. The expected result is Negative.  Fact Sheet for Patients:   EntrepreneurPulse.com.au  Fact Sheet for Healthcare Providers:  IncredibleEmployment.be  This test is no t yet approved or cleared by the [place] FDA and  has been authorized for detection and/or diagnosis of SARS-CoV-2 by FDA under an Emergency Use Authorization (EUA). This EUA will remain  in effect (meaning this test can be used) for the duration of the COVID-19 declaration under Section 564(b)(1) of the Act, 21 U.S.C.section 360bbb-3(b)(1), unless the authorization is terminated  or revoked sooner.       Influenza A by PCR NEGATIVE NEGATIVE Final   Influenza B by PCR NEGATIVE NEGATIVE Final    Comment: (NOTE) The Xpert Xpress SARS-CoV-2/FLU/RSV plus assay is intended as an aid in the diagnosis of influenza from Nasopharyngeal swab specimens and should not be used as a sole basis for treatment. Nasal washings and aspirates are unacceptable for Xpert Xpress SARS-CoV-2/FLU/RSV testing.  Fact Sheet for Patients: EntrepreneurPulse.com.au  Fact Sheet for Healthcare Providers: IncredibleEmployment.be  This test is not yet approved or cleared by the [place] FDA and has been authorized for detection and/or diagnosis of SARS-CoV-2 by FDA under an Emergency Use Authorization (EUA). This EUA will remain in effect (meaning this test can be used) for the duration of the COVID-19 declaration under Section 564(b)(1) of the Act, 21 U.S.C. section 360bbb-3(b)(1), unless the authorization is terminated or revoked.  Performed at [facility], [place] [address], [place], [place] [address]   Culture, blood (routine x 2)     Status: Abnormal (Preliminary result)   Collection Time: 11/17/20 12:47 PM   Specimen: BLOOD  Result Value Ref Range Status   Specimen Description   Final    BLOOD RIGHT ANTECUBITAL Performed at [place] [address], [place], [place]  [address]    Special Requests   Final    BOTTLES DRAWN AEROBIC AND ANAEROBIC Blood Culture adequate volume Performed at [place] [address] [place], [place] [address]    Culture  Setup Time   Final    YEAST  AEROBIC BOTTLE ONLY CRITICAL RESULT CALLED TO, READ BACK BY AND VERIFIED WITH: TERRY GREEN PHARMD @1002  11/21/20 EB    Culture (A)  Final    CANDIDA ALBICANS Sent to [place] for further susceptibility testing. Performed at [facility], [place] [address], [place], [place] [address]    Report Status PENDING  Incomplete  Antifungal AST 9 Drug Panel     Status: None (Preliminary result)   Collection Time: 11/17/20 12:47 PM  Result Value Ref Range Status   Organism ID, Yeast Preliminary report  Final    Comment: (NOTE) Specimen has been received and testing has been initiated. Performed At: [facility] [place], [place] [id number] Rush Farmer MD [id number]    Amphotericin B MIC PENDING  Incomplete   Please Note: PENDING  Incomplete   Anidulafungin MIC PENDING  Incomplete   Caspofungin MIC PENDING  Incomplete   Micafungin MIC PENDING  Incomplete   Posaconazole MIC PENDING  Incomplete   Please Note: PENDING  Incomplete   Fluconazole Islt MIC PENDING  Incomplete   Flucytosine MIC PENDING  Incomplete   Itraconazole MIC PENDING  Incomplete   Voriconazole MIC PENDING  Incomplete   Source CANDIDA ALBICANS FROM BLOOD CULTURE  Final    Comment: Performed at [facility], [place] [address], [place], [place] [address]  Culture, Urine     Status: Abnormal   Collection Time: 11/17/20  1:40 PM   Specimen: Urine, Random  Result Value Ref Range Status   Specimen Description   Final    URINE, RANDOM Performed at [place] [address], [place], [place] [address]    Special Requests   Final    NONE Performed at [facility], [place] [address] [place], [place] [address]    Culture  MULTIPLE SPECIES PRESENT, SUGGEST RECOLLECTION (A)  Final   Report Status 11/19/2020 FINAL  Final  MRSA PCR Screening     Status: None   Collection Time: 11/17/20  3:49 PM   Specimen: Nasal Mucosa; Nasopharyngeal  Result Value Ref Range Status   MRSA by PCR NEGATIVE NEGATIVE Final    Comment:        The GeneXpert MRSA Assay (FDA approved for NASAL specimens only), is one component of a comprehensive MRSA colonization surveillance program. It is not intended to diagnose MRSA infection nor to guide or monitor treatment for MRSA infections. Performed at [facility] [place] [address], [place], [place] [address]   Culture, blood (Routine X 2) w Reflex to ID Panel     Status: None (Preliminary result)   Collection Time: 11/21/20 12:58 PM   Specimen: BLOOD LEFT HAND  Result Value Ref Range Status   Specimen Description   Final    BLOOD LEFT HAND Performed at [place] [address], [place], [place] [address]    Special Requests   Final    BOTTLES DRAWN AEROBIC AND ANAEROBIC Blood Culture adequate volume Performed at [place] [address] [place], [place] [address]    Culture   Final    NO GROWTH 4 DAYS Performed at [facility], [place] [address] [place], [place] [address]    Report Status PENDING  Incomplete  Culture, blood (Routine X 2) w Reflex to ID Panel     Status: None (Preliminary result)   Collection Time: 11/21/20 12:58 PM   Specimen: BLOOD RIGHT HAND  Result Value Ref Range Status  Specimen Description   Final    BLOOD RIGHT HAND Performed at [place] [address], [place], [place] [address]    Special Requests   Final    BOTTLES DRAWN AEROBIC AND ANAEROBIC Blood Culture adequate volume Performed at [place] [address], [place], [place] [address]    Culture   Final    NO GROWTH 4 DAYS Performed at [facility], [place] [address] [place], [place] [address]    Report Status PENDING  Incomplete  Resp Panel by RT-PCR (Flu A&B, Covid) Nasopharyngeal Swab     Status: None   Collection Time: 11/24/20  4:40 PM   Specimen: Nasopharyngeal Swab; Nasopharyngeal(NP) swabs in vial transport medium  Result Value Ref Range Status   SARS Coronavirus 2 by RT PCR NEGATIVE NEGATIVE Final    Comment: (NOTE) SARS-CoV-2 target nucleic acids are NOT DETECTED.  The SARS-CoV-2 RNA is generally detectable in upper respiratory specimens during the acute phase of infection. The lowest concentration of SARS-CoV-2 viral copies this assay can detect is 138 copies/mL. A negative result does not preclude SARS-Cov-2 infection and should not be used as the sole basis for treatment or other patient management decisions. A negative result may occur with  improper specimen collection/handling, submission of specimen other than nasopharyngeal swab, presence of viral mutation(s) within the areas targeted by this assay, and inadequate number of viral copies(<138 copies/mL). A negative result must be combined with clinical observations, patient history, and epidemiological information. The expected result is Negative.  Fact Sheet for Patients:  EntrepreneurPulse.com.au  Fact Sheet for Healthcare Providers:  IncredibleEmployment.be  This test is no t yet approved or cleared by the [place] FDA and  has been authorized for detection and/or diagnosis of SARS-CoV-2 by FDA under an Emergency Use Authorization (EUA). This EUA will remain  in effect (meaning this test can be used) for the duration of the COVID-19 declaration under Section 564(b)(1) of the Act, 21 U.S.C.section 360bbb-3(b)(1), unless the authorization is terminated  or revoked sooner.       Influenza A by PCR NEGATIVE NEGATIVE Final   Influenza B by PCR NEGATIVE NEGATIVE Final    Comment: (NOTE) The Xpert Xpress SARS-CoV-2/FLU/RSV plus assay  is intended as an aid in the diagnosis of influenza from Nasopharyngeal swab specimens and should not be used as a sole basis for treatment. Nasal washings and aspirates are unacceptable for Xpert Xpress SARS-CoV-2/FLU/RSV testing.  Fact Sheet for Patients: EntrepreneurPulse.com.au  Fact Sheet for Healthcare Providers: IncredibleEmployment.be  This test is not yet approved or cleared by the [place] FDA and has been authorized for detection and/or diagnosis of SARS-CoV-2 by FDA under an Emergency Use Authorization (EUA). This EUA will remain in effect (meaning this test can be used) for the duration of the COVID-19 declaration under Section 564(b)(1) of the Act, 21 U.S.C. section 360bbb-3(b)(1), unless the authorization is terminated or revoked.  Performed at [facility], [place] [address] [place], [place] [address]     Studies/Results: DG Chest 2 View  Result Date: 11/23/2020 CLINICAL DATA:  Volume axis. EXAM: CHEST - 2 VIEW COMPARISON:  No prior. FINDINGS: Cardiomegaly with mild pulmonary vascular prominence. Low lung volumes with mild bibasilar atelectasis. Small bilateral pleural effusions cannot be excluded. No pneumothorax. IMPRESSION: Cardiomegaly with mild pulmonary venous congestion. Low lung volumes with mild bibasilar atelectasis. Small bilateral pleural effusions cannot be excluded. 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 is a 70 y.o. male with high-grade bladder cancer status post cystoscopy with a radical cystoprostatectomy and ileal conduit  creation pelvic node dissection bilateral inguinal hernia repair laparoscopically ureteral stent placement, now admitted with hematuria concern for sepsis, with blood cultures positive for Candida albicans  1.  Candidemia: Given that this is Candida albicans we can switch over from Eraxis to fluconazole  Repeat blood cultures have been taken  He does need a dilated funduscopic exam to rule out fungal endophthalmitis  Note apparently Dr. Valetta Close does not see the need for fundoscopic exam because the patient "only had 1 blood culture +" --BUT HE ONLY HAD ONE BLOOD CULTURE TAKEN along with his urine culture on the 27th.  Dr. Tawanna Solo has tried to convince Dr. Valetta Close to see patient in the hospital but he will not come in.  I will make sure pt sees ophthalmology as an outpatient  Given the fact that he had stents placed recently in his bladder and our concerns that the urine was the source of his bacteremia we may need to give him extended course of fluconazole prior to removal of stents in a few weeks  We will go with 2 weeks of high dose fluconazole 400mg  daily   Followed by 200mg  daily until stents are removed   Zachary Shannon. has an appointment on 12/06/2020 at 230 PM with Dr. Tommy Medal  I have given him date on back of my card  He should arrive 15-30 minutes prior to his appointment.  The [facility] is located in the [facility] at  [place] in [place].  [address], which is located to the left of the elevators.  Phone: [phone]  Fax: [phone]  [email or web address]  I spent greater than 35 minutes with the patient including greater than 50% of time in face to face counsel of the patient and in coordination of his care.  I will sign off for now   Please call with further questions.      LOS: 10 days   Alcide Evener 11/25/2020, 2:20 PM

## 2020-11-25 NOTE — Discharge Summary (Addendum)
Physician Discharge Summary  Zachary Shannon. [id number] DOB: Jul 11, 1951 DOA: 11/15/2020  PCP: Minna Antis, DO  Admit date: 11/15/2020 Discharge date: 11/25/2020  Admitted From: Home Disposition:  SNF  Discharge Condition:Stable CODE STATUS:FULL Diet recommendation: Heart Healthy  Brief/Interim Summary:  Patient is a 70 year old male with history of bladder tumor status post cystoprostatectomy on 4/6 with placement of ileal conduit/ stents, chronic systolic heart failure, paroxysmal A. fib, coronary disease, hyperlipidemia who presented to the emergency department with complaints of flank/suprapubic plan/confusion.  On presentation, was found to have severe AKI with creatinine of more than 6.  Baseline creatinine normal.  Patient was admitted for the management of severe renal failure.  CT stone study did not show any acute finding.  Started on IV antibiotics, IV fluids for possible pyelonephritis.  Nephrology consulted.  Urine culture showed multiple species.  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  ID ,neprhology following.  Kidney function has significantly improved.  PT/OT recommended skilled nursing facility on discharge.  Stable for discharge today.  Following problems were addressed during his hospitalization:   Severe AKI: Baseline creatinine is normal.  Presented with creatinine of more than 6.  Started on IV fluids, bicarb drip.  Nephrology, urology were following.  Kidney function improved but trended up again now again improving after he was started on Lasix.  There is also concern for fungal pyelonephritis. Check kidney function in a week by doing a BMP test.  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 One Blood culture has shown Candida, currently on Eraxis.  ID following. TTE did not show any obvious vegetation.  Repeat blood cultures will be followed,NGTD.  I had a long  discussion with Dr. Valetta Close, [facility] ophthalmology,when I  consulted him for a ophthalmoscopy for ruling out endophthalmitis.  He suggested to follow-up as an outpatient because  only 1 blood culture is positive and the repeat blood cultures have been negativeWe will strongly suggest to follow-up with [facility] ophthalmology as soon as possible as an outpatient.  He will follow-up with ID as an outpatient  Chest pressure: Resolved.  Complain of some chest pressure on 4/27.  Echocardiogram showed normal ejection fraction, no wall motion abnormality.  Troponins were unremarkable.  Cardiology signed off.  VQ scan was done to rule out PE, ruled out.  Back pain: MRI had some mild disc protrusion.  We also discussed with Dr. Christella Noa, neurosurgery.  No plan for intervention.  Continue supportive care.  We recommend to follow-up with orthopedics as an outpatient.  Paroxysmal atrial fibrillation: Currently rate is well controlled.  On Eliquis for anticoagulation.  On metoprolol for rate control.  History of systolic heart failure/coronary artery  disease: Currently stable.  History of ischemic coronary artery disease chronic systolic heart failure with ejection fraction of 20 to 25% but improved to 55-60% as per Echo done on 11/18/2020.  Follows with [facility] cardiology in [place]  Hyperkalemia: Resolved. treated with Lokelma, bicarb. .  Bladder cancer status post recent cystoprostatectomy: Status post ileal conduit, ureteral stent placement on 10/27/2020 by Dr Tresa Moore.  Urology was following.  There was some concern for fungal pyelo.  Leukocytosis improving.  Initially on ceftriaxone which has been changed to cefdinir,now stopped.  Hypotension: Resolved.  Continue current medications  Poor oral intake: Nutrition was following.  Deconditioning/debility: PT/OT recommended SNF on discharge.   Discharge Diagnoses:  Principal Problem:   Candidemia [facility]) Active Problems:   Bladder cancer  (  [place])   Acute renal failure (HCC)   PAF (paroxysmal atrial fibrillation) (HCC)   Hyperkalemia   Hyperlipidemia   Chest pressure   Pyelonephritis    Discharge Instructions  Discharge Instructions    Diet - low sodium heart healthy   Complete by: As directed    Discharge instructions   Complete by: As directed    1)Please take prescribed medications as instructed 2)Do a CBC ,BMP tests in a week 3)Follow up with Infectious disease,nephrology ,urology and Ophthalmology as an outpatient   Increase activity slowly   Complete by: As directed    No wound care   Complete by: As directed      Allergies as of 11/25/2020   No Known Allergies     Medication List    STOP taking these medications   enalapril 20 MG tablet Commonly known as: VASOTEC   hydrALAZINE 25 MG tablet Commonly known as: APRESOLINE     TAKE these medications   apixaban 5 MG Tabs tablet Commonly known as: ELIQUIS Take 5 mg by mouth 2 (two) times daily.   atorvastatin 10 MG tablet Commonly known as: LIPITOR Take 10 mg by mouth at bedtime.   diltiazem 240 MG 24 hr capsule Commonly known as: DILACOR XR Take 240 mg by mouth daily.   docusate sodium 100 MG capsule Commonly known as: COLACE Take 1 capsule (100 mg total) by mouth daily. Start taking on: Nov 26, 2020   fluconazole 200 MG tablet Commonly known as: DIFLUCAN Take 2 tablets (400 mg total) by mouth daily for 10 days. Start taking on: Nov 26, 2020   fluconazole 200 MG tablet Commonly known as: DIFLUCAN Take 1 tablet (200 mg total) by mouth daily. Start taking on: Dec 06, 2020   furosemide 40 MG tablet Commonly known as: Lasix Take 1 tablet (40 mg total) by mouth daily for 5 days. Start taking on: Nov 26, 2020   lidocaine 5 % Commonly known as: LIDODERM Place 1 patch onto the skin daily. Remove & Discard patch within 12 hours or as directed by MD   methocarbamol 500 MG tablet Commonly known as: ROBAXIN Take 1 tablet (500 mg total) by  mouth every 6 (six) hours as needed for muscle spasms.   metoprolol tartrate 50 MG tablet Commonly known as: LOPRESSOR Take 25 mg by mouth 2 (two) times daily.   oxyCODONE-acetaminophen 5-325 MG tablet Commonly known as: Percocet Take 0.5-1 tablets by mouth every 6 (six) hours as needed for moderate pain or severe pain. Post-operatively   pantoprazole 40 MG tablet Commonly known as: PROTONIX Take 1 tablet (40 mg total) by mouth daily. Start taking on: Nov 26, 2020   sodium bicarbonate 650 MG tablet Take 2 tablets (1,300 mg total) by mouth 2 (two) times daily.       Contact information for follow-up providers    Minna Antis, DO. Schedule an appointment as soon as possible for a visit in 2 week(s).   Specialty: Family Medicine Contact information: [address] [place] [address] [phone]        Tommy Medal, Lavell Islam, MD. Schedule an appointment as soon as possible for a visit in 2 week(s).   Specialty: Infectious Diseases Why: you will be called for appointment Contact information: [address] [place] [address] [phone]            Contact information for after-discharge care    Destination    [facility] .   Service:  Skilled Nursing Contact information: [place] [phone]                 No Known Allergies  Consultations:  ID, nephrology, urology   Procedures/Studies: CT ABDOMEN PELVIS WO CONTRAST  Result Date: 11/09/2020 CLINICAL DATA:  Hematuria EXAM: CT ABDOMEN AND PELVIS WITHOUT CONTRAST TECHNIQUE: Multidetector CT imaging of the abdomen and pelvis was performed following the standard protocol without IV contrast. COMPARISON:  CT 09/22/2019 FINDINGS: Lower chest: Lung bases demonstrate no acute consolidation or effusion. Linear scarring or atelectasis at the right base. Cardiomegaly with coronary vascular calcification. Small hiatal hernia.  Hepatobiliary: Small gallstones. No focal hepatic abnormality or biliary dilatation Pancreas: Unremarkable. No pancreatic ductal dilatation or surrounding inflammatory changes. Spleen: Normal in size without focal abnormality. Adrenals/Urinary Tract: Adrenal glands are normal. Minimal bilateral hydronephrosis with ureteral stents in place. Patient is status post cysto prostatectomy and creation of right lower quadrant ileal conduit. Bilateral ureteral stents terminate in a right abdominal ileostomy. Ileostomy is nondistended. Stomach/Bowel: The stomach is nonenlarged. No dilated small bowel. Postsurgical changes of the small bowel within the anterior abdominal cavity. Sigmoid colon diverticular disease without acute inflammatory change. Transverse orientation of cecum. Midline negative appendix. Vascular/Lymphatic: Advanced aortic atherosclerosis. No aneurysm. No suspicious lymph nodes. Reproductive: Status post prostatectomy. Fluid in the right inguinal canal. Status post bilateral inguinal hernia repair. Gas within the inguinal canals. Other: Small free fluid within the pelvis. No free air. Moderate to large volume subcutaneous emphysema within the abdominal walls, extending into the bilateral thighs. Musculoskeletal: No acute or suspicious osseous abnormality. IMPRESSION: 1. Status post cysto prostatectomy and creation of right lower quadrant ileal conduit and urostomy. Minimal bilateral hydronephrosis with placement of bilateral ureteral stents which terminate in right lower quadrant urostomy. Negative for stone disease. Status post bilateral inguinal hernia repair with fluid in the right inguinal canal and small amount of gas within the inguinal canals. 2. Moderate to large volume of subcutaneous emphysema within the abdominal walls, extending into the bilateral thighs, presumably related to history of laparoscopic procedure 3. Sigmoid colon diverticular disease without acute inflammatory change. 4. Small  gallstones. 5. Small free fluid within the pelvis likely postoperative 6. Aortic atherosclerosis. Aortic Atherosclerosis (ICD10-I70.0). Electronically Signed   By: Donavan Foil M.D.   On: 11/09/2020 23:58   DG Chest 2 View  Result Date: 11/23/2020 CLINICAL DATA:  Volume axis. EXAM: CHEST - 2 VIEW COMPARISON:  No prior. FINDINGS: Cardiomegaly with mild pulmonary vascular prominence. Low lung volumes with mild bibasilar atelectasis. Small bilateral pleural effusions cannot be excluded. No pneumothorax. IMPRESSION: Cardiomegaly with mild pulmonary venous congestion. Low lung volumes with mild bibasilar atelectasis. Small bilateral pleural effusions cannot be excluded. Electronically Signed   By: Marcello Moores  Register   On: 11/23/2020 15:28   DG Chest 2 View  Result Date: 11/15/2020 CLINICAL DATA:  Chest pain EXAM: CHEST - 2 VIEW COMPARISON:  None. FINDINGS: Cardiomegaly. Pulmonary vascular congestion. No pleural effusion or pneumothorax. No acute osseous abnormality. IMPRESSION: Pulmonary vascular congestion and cardiomegaly. Electronically Signed   By: Macy Mis M.D.   On: 11/15/2020 15:09   CT Head Wo Contrast  Result Date: 11/15/2020 CLINICAL DATA:  70 year old male with concern for intracranial hemorrhage. EXAM: CT HEAD WITHOUT CONTRAST TECHNIQUE: Contiguous axial images were obtained from the base of the skull through the vertex without intravenous contrast. COMPARISON:  None. FINDINGS: Brain: Mild age-related atrophy and chronic microvascular ischemic changes. There is no acute intracranial hemorrhage. No  mass effect or midline shift. No extra-axial fluid collection. Vascular: No hyperdense vessel or unexpected calcification. Skull: Normal. Negative for fracture or focal lesion. Sinuses/Orbits: No acute finding. Other: None IMPRESSION: 1. No acute intracranial pathology. 2. Mild age-related atrophy and chronic microvascular ischemic changes. Electronically Signed   By: Anner Crete M.D.   On:  11/15/2020 16:57   MR THORACIC SPINE WO CONTRAST  Result Date: 11/16/2020 CLINICAL DATA:  Initial evaluation for mid scapular back pain, right greater than left, for 1 week. EXAM: MRI THORACIC SPINE WITHOUT CONTRAST TECHNIQUE: Multiplanar, multisequence MR imaging of the thoracic spine was performed. No intravenous contrast was administered. COMPARISON:  None. FINDINGS: Alignment: Mild exaggeration of the normal thoracic kyphosis. No listhesis. Vertebrae: Vertebral body height maintained without acute or chronic fracture. Bone marrow signal intensity within normal limits. Subcentimeter benign hemangioma noted within the T9 vertebral body. No other discrete or worrisome osseous lesions. No abnormal marrow edema. Cord:  Normal signal and morphology. Paraspinal and other soft tissues: Paraspinous soft tissues within normal limits. Trace layering bilateral pleural effusions noted. Disc levels: T6-7: Small right paracentral disc protrusion indents and partially flattens the right ventral thecal sac (series 21, image 17). Mild flattening of the right ventral cord without cord signal changes or significant spinal stenosis. Foramina remain patent. Otherwise, no other significant disc pathology seen for patient age. No other significant disc bulge or focal disc herniation. No canal or neural foraminal stenosis or evidence for neural impingement. IMPRESSION: 1. Small right paracentral disc protrusion at T6-7 with resultant mild flattening of the right ventral cord, but no significant stenosis. Finding could contribute to patient's symptoms. 2. Otherwise unremarkable MRI of the thoracic spine for age. 3. Trace layering bilateral pleural effusions. Electronically Signed   By: Jeannine Boga M.D.   On: 11/16/2020 23:01   NM Pulmonary Perfusion  Result Date: 11/17/2020 CLINICAL DATA:  70 year old male with chest pain and shortness of breath. EXAM: NUCLEAR MEDICINE PERFUSION LUNG SCAN TECHNIQUE: Perfusion images were  obtained in multiple projections after intravenous injection of radiopharmaceutical. Ventilation scans intentionally deferred if perfusion scan and chest x-ray adequate for interpretation during COVID 19 epidemic. RADIOPHARMACEUTICALS:  Chest radiograph dated 11/17/2020. MCi Tc-69m MAA IV COMPARISON:  None. FINDINGS: There is slight heterogeneous uptake, likely artifactual. No focal perfusion defect noted. IMPRESSION: No scintigraphic evidence of pulmonary embolism. Electronically Signed   By: Anner Crete M.D.   On: 11/17/2020 16:54   US RENAL  Result Date: 11/23/2020 CLINICAL DATA:  Acute kidney injury EXAM: RENAL / URINARY TRACT ULTRASOUND COMPLETE COMPARISON:  None. FINDINGS: Right Kidney: Renal measurements: 12.6 x 6.0 x 6.1 cm = volume: 240.6 mL. Echogenicity is mildly increased. Renal cortical thickness normal. No mass, perinephric fluid, or hydronephrosis visualized. No sonographically demonstrable calculus or ureterectasis. Left Kidney: Renal measurements: 12.1 x 6.5 x 6.2 cm = volume: 255.8 mL. Echogenicity is mildly increased. Renal cortical thickness normal. No mass, perinephric fluid, or hydronephrosis visualized. No sonographically demonstrable calculus or ureterectasis. Bladder: Appears normal for degree of bladder distention. Other: None. IMPRESSION: Mild increase in renal echogenicity, a finding likely indicative of underlying medical renal disease. Study otherwise unremarkable. Note that there is no obstructing focus in either kidney. Electronically Signed   By: Lowella Grip III M.D.   On: 11/23/2020 14:17   DG CHEST PORT 1 VIEW  Result Date: 11/17/2020 CLINICAL DATA:  Chest pain EXAM: PORTABLE CHEST 1 VIEW COMPARISON:  November 15, 2020. FINDINGS: Lungs are clear. Heart is mildly enlarged with pulmonary  vascularity within normal limits. No adenopathy. There is aortic atherosclerosis. No bone lesions. IMPRESSION: Mild cardiomegaly. No edema or airspace opacity. Aortic Atherosclerosis  (ICD10-I70.0). Electronically Signed   By: Lowella Grip III M.D.   On: 11/17/2020 12:03   ECHOCARDIOGRAM COMPLETE  Result Date: 11/18/2020    ECHOCARDIOGRAM REPORT   Patient Name:   Cara Payment. Date of Exam: 11/18/2020 Medical Rec #:  [id number]                 Height:       72.0 in Accession #:    [id number]                Weight:       258.2 lb Date of Birth:  15-Jul-1951                 BSA:          2.375 m Patient Age:    26 years                  BP:           102/64 mmHg Patient Gender: M                         HR:           103 bpm. Exam Location:  Inpatient Procedure: 2D Echo, Color Doppler and Cardiac Doppler Indications:    Dyspnea  History:        Patient has no prior history of Echocardiogram examinations.                 Cardiomyopathy, CAD, Arrythmias:Atrial Fibrillation; Risk                 Factors:Hypertension and Dyslipidemia.  Sonographer:    Luisa Hart RDCS Referring Phys: [id number] BELKYS A REGALADO IMPRESSIONS  1. Left ventricular ejection fraction, by estimation, is 55 to 60%. The left ventricle has normal function. The left ventricle has no regional wall motion abnormalities. Left ventricular diastolic function could not be evaluated.  2. Right ventricular systolic function is mildly reduced. The right ventricular size is mildly enlarged. There is moderately elevated pulmonary artery systolic pressure. The estimated right ventricular systolic pressure is 0000000 mmHg.  3. Left atrial size was severely dilated.  4. Right atrial size was moderately dilated.  5. The mitral valve is grossly normal. Trivial mitral valve regurgitation. No evidence of mitral stenosis.  6. The aortic valve is tricuspid. There is mild calcification of the aortic valve. There is mild thickening of the aortic valve. Aortic valve regurgitation is not visualized. No aortic stenosis is present.  7. Aortic dilatation noted. There is mild dilatation of the aortic root, measuring 42 mm. There is mild  dilatation of the aortic root, measuring 43 mm.  8. The inferior vena cava is dilated in size with >50% respiratory variability, suggesting right atrial pressure of 8 mmHg. FINDINGS  Left Ventricle: Left ventricular ejection fraction, by estimation, is 55 to 60%. The left ventricle has normal function. The left ventricle has no regional wall motion abnormalities. The left ventricular internal cavity size was normal in size. There is  no left ventricular hypertrophy. Left ventricular diastolic function could not be evaluated due to atrial fibrillation. Left ventricular diastolic function could not be evaluated. Right Ventricle: The right ventricular size is mildly enlarged. No increase in right ventricular wall thickness. Right ventricular systolic function is mildly reduced. There  is moderately elevated pulmonary artery systolic pressure. The tricuspid regurgitant velocity is 3.39 m/s, and with an assumed right atrial pressure of 8 mmHg, the estimated right ventricular systolic pressure is 0000000 mmHg. Left Atrium: Left atrial size was severely dilated. Right Atrium: Right atrial size was moderately dilated. Pericardium: Trivial pericardial effusion is present. Presence of pericardial fat pad. Mitral Valve: The mitral valve is grossly normal. Mild mitral annular calcification. Trivial mitral valve regurgitation. No evidence of mitral valve stenosis. Tricuspid Valve: The tricuspid valve is grossly normal. Tricuspid valve regurgitation is mild . No evidence of tricuspid stenosis. Aortic Valve: The aortic valve is tricuspid. There is mild calcification of the aortic valve. There is mild thickening of the aortic valve. Aortic valve regurgitation is not visualized. No aortic stenosis is present. Aortic valve mean gradient measures 3.0 mmHg. Aortic valve peak gradient measures 5.6 mmHg. Aortic valve area, by VTI measures 2.83 cm. Pulmonic Valve: The pulmonic valve was grossly normal. Pulmonic valve regurgitation is not  visualized. No evidence of pulmonic stenosis. Aorta: Aortic dilatation noted. There is mild dilatation of the aortic root, measuring 42 mm. There is mild dilatation of the aortic root, measuring 43 mm. Venous: The inferior vena cava is dilated in size with greater than 50% respiratory variability, suggesting right atrial pressure of 8 mmHg. IAS/Shunts: The atrial septum is grossly normal.  LEFT VENTRICLE PLAX 2D LVIDd:         5.10 cm LVIDs:         3.10 cm LV PW:         0.90 cm LV IVS:        0.90 cm LVOT diam:     2.30 cm LV SV:         54 LV SV Index:   23 LVOT Area:     4.15 cm  LV Volumes (MOD) LV vol d, MOD A2C: 77.7 ml LV vol d, MOD A4C: 113.0 ml LV vol s, MOD A2C: 29.0 ml LV vol s, MOD A4C: 66.8 ml LV SV MOD A2C:     48.7 ml LV SV MOD A4C:     113.0 ml LV SV MOD BP:      51.7 ml RIGHT VENTRICLE RV S prime:     13.10 cm/s TAPSE (M-mode): 1.5 cm LEFT ATRIUM              Index LA Vol (A2C):   187.0 ml 78.75 ml/m LA Vol (A4C):   108.0 ml 45.48 ml/m LA Biplane Vol: 150.0 ml 63.17 ml/m  AORTIC VALVE                   PULMONIC VALVE AV Area (Vmax):    2.67 cm    PV Vmax:       1.02 m/s AV Area (Vmean):   2.43 cm    PV Vmean:      75.600 cm/s AV Area (VTI):     2.83 cm    PV VTI:        0.189 m AV Vmax:           118.00 cm/s PV Peak grad:  4.2 mmHg AV Vmean:          80.400 cm/s PV Mean grad:  3.0 mmHg AV VTI:            0.191 m AV Peak Grad:      5.6 mmHg AV Mean Grad:      3.0 mmHg LVOT Vmax:  75.80 cm/s LVOT Vmean:        47.100 cm/s LVOT VTI:          0.130 m LVOT/AV VTI ratio: 0.68  AORTA Ao Root diam: 4.20 cm Ao Asc diam:  4.30 cm MITRAL VALVE               TRICUSPID VALVE MV Area (PHT): 7.59 cm    TR Peak grad:   46.0 mmHg MV Decel Time: 100 msec    TR Vmax:        339.00 cm/s MV E velocity: 92.75 cm/s                            SHUNTS                            Systemic VTI:  0.13 m                            Systemic Diam: 2.30 cm Eleonore Chiquito MD Electronically signed by Eleonore Chiquito MD  Signature Date/Time: 11/18/2020/11:13:56 AM    Final    CT Renal Stone Study  Result Date: 11/15/2020 CLINICAL DATA:  Flank pain. Urostomy 3 weeks ago. Bladder cancer. Hematuria. EXAM: CT ABDOMEN AND PELVIS WITHOUT CONTRAST TECHNIQUE: Multidetector CT imaging of the abdomen and pelvis was performed following the standard protocol without IV contrast. COMPARISON:  11/09/2020 FINDINGS: Lower chest: Bibasilar scarring. Mild cardiomegaly, without pericardial or pleural effusion. Right coronary artery calcification. Hepatobiliary: Normal noncontrast appearance of the liver. Stones in the gallbladder without acute cholecystitis or biliary duct dilatation. Pancreas: Normal, without mass or ductal dilatation. Spleen: Normal in size, without focal abnormality. Adrenals/Urinary Tract: Normal adrenal glands. Bilateral ureteric stents again identified. The left-sided stent originates in an extrarenal pelvis. No renal calculi or hydronephrosis. Cystectomy and ileal conduit creation. 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 Stomach/Bowel: Normal stomach, without wall thickening. Descending duodenal diverticulum. Otherwise normal small bowel. Extensive colonic diverticulosis. Normal terminal ileum and appendix. Otherwise normal small bowel. Vascular/Lymphatic: Aortic atherosclerosis. No abdominopelvic adenopathy. Reproductive: Prostatectomy. Other: Small volume perisplenic fluid is slightly increased. Pelvic interloop mesenteric fluid is minimally increased, including on 67/2. Pelvic cul-de-sac fluid is similar. Again identified is fluid within a right inguinal hernia. Fat containing tiny left inguinal hernia. No free intraperitoneal air. Again identified is extensive subcutaneous emphysema about the abdominopelvic and lower thoracic walls. Similar in distribution, slightly decreased. Musculoskeletal: Left hip osteoarthritis. IMPRESSION: 1. Status post cystoprostatectomy with ileal conduit creation. Similar appearance  of ureteric stents in place, without urinary tract calculi or hydronephrosis. 2. Similar to slight increase in small volume abdominopelvic fluid. Minimal decrease in extensive subcutaneous emphysema. 3. Right-sided fluid containing and left-sided fat containing inguinal hernias, similar. 4. Coronary artery atherosclerosis. Aortic Atherosclerosis (ICD10-I70.0). Electronically Signed   By: Abigail Miyamoto M.D.   On: 11/15/2020 17:09      Subjective:  Patient seen and examined at the bedside this morning.  Hemodynamically stable.  Comfortable.  Stable for discharge today. Discharge Exam: Vitals:   11/24/20 1954 11/25/20 0519  BP: 118/75 (!) 122/92  Pulse: 86 82  Resp: 16 16  Temp: 98.1 F (36.7 C) 97.9 F (36.6 C)  SpO2: 93% 95%   Vitals:   11/24/20 0908 11/24/20 1342 11/24/20 1954 11/25/20 0519  BP: 121/79 116/77 118/75 (!) 122/92  Pulse: 83 87 86 82  Resp:  18 16 16   Temp:  98.1 F (36.7 C) 98.1 F (36.7 C) 97.9 F (36.6 C)  TempSrc:  Oral Oral Oral  SpO2:  92% 93% 95%  Weight:    113.3 kg  Height:        General: Pt is alert, awake, not in acute distress Cardiovascular: RRR, S1/S2 +, no rubs, no gallops Respiratory: CTA bilaterally, no wheezing, no rhonchi Abdominal: Soft, NT, ND, bowel sounds +, right lower quadrant urostomy Extremities: no edema, no cyanosis    The results of significant diagnostics from this hospitalization (including imaging, microbiology, ancillary and laboratory) are listed below for reference.     Microbiology: Recent Results (from the past 240 hour(s))  Resp Panel by RT-PCR (Flu A&B, Covid) Nasopharyngeal Swab     Status: None   Collection Time: 11/15/20  6:33 PM   Specimen: Nasopharyngeal Swab; Nasopharyngeal(NP) swabs in vial transport medium  Result Value Ref Range Status   SARS Coronavirus 2 by RT PCR NEGATIVE NEGATIVE Final    Comment: (NOTE) SARS-CoV-2 target nucleic acids are NOT DETECTED.  The SARS-CoV-2 RNA is generally detectable  in upper respiratory specimens during the acute phase of infection. The lowest concentration of SARS-CoV-2 viral copies this assay can detect is 138 copies/mL. A negative result does not preclude SARS-Cov-2 infection and should not be used as the sole basis for treatment or other patient management decisions. A negative result may occur with  improper specimen collection/handling, submission of specimen other than nasopharyngeal swab, presence of viral mutation(s) within the areas targeted by this assay, and inadequate number of viral copies(<138 copies/mL). A negative result must be combined with clinical observations, patient history, and epidemiological information. The expected result is Negative.  Fact Sheet for Patients:  EntrepreneurPulse.com.au  Fact Sheet for Healthcare Providers:  IncredibleEmployment.be  This test is no t yet approved or cleared by the [place] FDA and  has been authorized for detection and/or diagnosis of SARS-CoV-2 by FDA under an Emergency Use Authorization (EUA). This EUA will remain  in effect (meaning this test can be used) for the duration of the COVID-19 declaration under Section 564(b)(1) of the Act, 21 U.S.C.section 360bbb-3(b)(1), unless the authorization is terminated  or revoked sooner.       Influenza A by PCR NEGATIVE NEGATIVE Final   Influenza B by PCR NEGATIVE NEGATIVE Final    Comment: (NOTE) The Xpert Xpress SARS-CoV-2/FLU/RSV plus assay is intended as an aid in the diagnosis of influenza from Nasopharyngeal swab specimens and should not be used as a sole basis for treatment. Nasal washings and aspirates are unacceptable for Xpert Xpress SARS-CoV-2/FLU/RSV testing.  Fact Sheet for Patients: EntrepreneurPulse.com.au  Fact Sheet for Healthcare Providers: IncredibleEmployment.be  This test is not yet approved or cleared by the [place] FDA and has  been authorized for detection and/or diagnosis of SARS-CoV-2 by FDA under an Emergency Use Authorization (EUA). This EUA will remain in effect (meaning this test can be used) for the duration of the COVID-19 declaration under Section 564(b)(1) of the Act, 21 U.S.C. section 360bbb-3(b)(1), unless the authorization is terminated or revoked.  Performed at [facility], [place] [address] [place], [place] [address]   Culture, blood (routine x 2)     Status: Abnormal (Preliminary result)   Collection Time: 11/17/20 12:47 PM   Specimen: BLOOD  Result Value Ref Range Status   Specimen Description   Final    BLOOD RIGHT ANTECUBITAL Performed at [facility], [address] [place], [place] [address]    Special Requests   Final    BOTTLES DRAWN AEROBIC AND ANAEROBIC Blood Culture adequate volume Performed at [place] [address], [place], [place] [address]    Culture  Setup Time   Final    YEAST AEROBIC BOTTLE ONLY CRITICAL RESULT CALLED TO, READ BACK BY AND VERIFIED WITH: TERRY GREEN PHARMD @1002  11/21/20 EB    Culture (A)  Final    CANDIDA ALBICANS Sent to [place] for further susceptibility testing. Performed at [facility], [place] [address], [place], [place] [address]    Report Status PENDING  Incomplete  Antifungal AST 9 Drug Panel     Status: None (Preliminary result)   Collection Time: 11/17/20 12:47 PM  Result Value Ref Range Status   Organism ID, Yeast Preliminary report  Final    Comment: (NOTE) Specimen has been received and testing has been initiated. Performed At: [facility] [place], [place] [id number] Rush Farmer MD [id number]    Amphotericin B MIC PENDING  Incomplete   Please Note: PENDING  Incomplete   Anidulafungin MIC PENDING  Incomplete   Caspofungin MIC PENDING  Incomplete   Micafungin MIC PENDING  Incomplete   Posaconazole MIC PENDING  Incomplete   Please  Note: PENDING  Incomplete   Fluconazole Islt MIC PENDING  Incomplete   Flucytosine MIC PENDING  Incomplete   Itraconazole MIC PENDING  Incomplete   Voriconazole MIC PENDING  Incomplete   Source CANDIDA ALBICANS FROM BLOOD CULTURE  Final    Comment: Performed at [facility], [place] [address], [place], [place] [address]  Culture, Urine     Status: Abnormal   Collection Time: 11/17/20  1:40 PM   Specimen: Urine, Random  Result Value Ref Range Status   Specimen Description   Final    URINE, RANDOM Performed at [place] [address], [place], [place] [address]    Special Requests   Final    NONE Performed at [facility], [place] [address] [place], [place] [address]    Culture MULTIPLE SPECIES PRESENT, SUGGEST RECOLLECTION (A)  Final   Report Status 11/19/2020 FINAL  Final  MRSA PCR Screening     Status: None   Collection Time: 11/17/20  3:49 PM   Specimen: Nasal Mucosa; Nasopharyngeal  Result Value Ref Range Status   MRSA by PCR NEGATIVE NEGATIVE Final    Comment:        The GeneXpert MRSA Assay (FDA approved for NASAL specimens only), is one component of a comprehensive MRSA colonization surveillance program. It is not intended to diagnose MRSA infection nor to guide or monitor treatment for MRSA infections. Performed at [facility], [place] [address], [place], [place] [address]   Culture, blood (Routine X 2) w Reflex to ID Panel     Status: None (Preliminary result)   Collection Time: 11/21/20 12:58 PM   Specimen: BLOOD LEFT HAND  Result Value Ref Range Status   Specimen Description   Final    BLOOD LEFT HAND Performed at [place] [address], [place], [place] [address]    Special Requests   Final    BOTTLES DRAWN AEROBIC AND ANAEROBIC Blood Culture adequate volume Performed at [place] [address] [place], [place] [address]    Culture   Final    NO  GROWTH 4 DAYS Performed at [facility], [address], [place], [place] [address]    Report Status PENDING  Incomplete  Culture, blood (Routine X 2) w Reflex to ID Panel     Status: None (Preliminary result)   Collection Time: 11/21/20 12:58 PM   Specimen: BLOOD RIGHT HAND  Result Value Ref Range Status   Specimen Description   Final    BLOOD RIGHT HAND Performed at [place] [address], [place], [place] [address]    Special Requests   Final    BOTTLES DRAWN AEROBIC AND ANAEROBIC Blood Culture adequate volume Performed at [place] [address] [place], [place] [address]    Culture   Final    NO GROWTH 4 DAYS Performed at [facility], [place] [address] [place], [place] [address]    Report Status PENDING  Incomplete  Resp Panel by RT-PCR (Flu A&B, Covid) Nasopharyngeal Swab     Status: None   Collection Time: 11/24/20  4:40 PM   Specimen: Nasopharyngeal Swab; Nasopharyngeal(NP) swabs in vial transport medium  Result Value Ref Range Status   SARS Coronavirus 2 by RT PCR NEGATIVE NEGATIVE Final    Comment: (NOTE) SARS-CoV-2 target nucleic acids are NOT DETECTED.  The SARS-CoV-2 RNA is generally detectable in upper respiratory specimens during the acute phase of infection. The lowest concentration of SARS-CoV-2 viral copies this assay can detect is 138 copies/mL. A negative result does not preclude SARS-Cov-2 infection and should not be used as the sole basis for treatment or other patient management decisions. A negative result may occur with  improper specimen collection/handling, submission of specimen other than nasopharyngeal swab, presence of viral mutation(s) within the areas targeted by this assay, and inadequate number of viral copies(<138 copies/mL). A negative result must be combined with clinical observations, patient history, and epidemiological information. The expected result is Negative.  Fact  Sheet for Patients:  EntrepreneurPulse.com.au  Fact Sheet for Healthcare Providers:  IncredibleEmployment.be  This test is no t yet approved or cleared by the [place] FDA and  has been authorized for detection and/or diagnosis of SARS-CoV-2 by FDA under an Emergency Use Authorization (EUA). This EUA will remain  in effect (meaning this test can be used) for the duration of the COVID-19 declaration under Section 564(b)(1) of the Act, 21 U.S.C.section 360bbb-3(b)(1), unless the authorization is terminated  or revoked sooner.       Influenza A by PCR NEGATIVE NEGATIVE Final   Influenza B by PCR NEGATIVE NEGATIVE Final    Comment: (NOTE) The Xpert Xpress SARS-CoV-2/FLU/RSV plus assay is intended as an aid in the diagnosis of influenza from Nasopharyngeal swab specimens and should not be used as a sole basis for treatment. Nasal washings and aspirates are unacceptable for Xpert Xpress SARS-CoV-2/FLU/RSV testing.  Fact Sheet for Patients: EntrepreneurPulse.com.au  Fact Sheet for Healthcare Providers: IncredibleEmployment.be  This test is not yet approved or cleared by the [place] FDA and has been authorized for detection and/or diagnosis of SARS-CoV-2 by FDA under an Emergency Use Authorization (EUA). This EUA will remain in effect (meaning this test can be used) for the duration of the COVID-19 declaration under Section 564(b)(1) of the Act, 21 U.S.C. section 360bbb-3(b)(1), unless the authorization is terminated or revoked.  Performed at [facility], [place] [address], [place], [place] [address]      Labs: BNP (last 3 results) No results for input(s): BNP in the last 8760 hours. Basic Metabolic Panel: Recent Labs  Lab  11/21/20 1258 11/22/20 0545 11/23/20 0529 11/24/20 0521 11/25/20 0551  NA 136 141 139 142 143  K 5.6* 5.5* 5.1 4.7 4.6  CL 106 109 108 105 101  CO2 23  23 22 27 27   GLUCOSE 112* 101* 95 94 96  BUN 74* 74* 67* 64* 60*  CREATININE 3.87* 3.95* 3.78* 3.47* 2.24*  CALCIUM 8.1* 8.3* 8.0* 8.4* 8.9  MG 2.3  --   --   --   --   PHOS 4.1 4.2  --   --   --    Liver Function Tests: Recent Labs  Lab 11/21/20 1258 11/22/20 0545  ALBUMIN 2.0* 2.0*   No results for input(s): LIPASE, AMYLASE in the last 168 hours. No results for input(s): AMMONIA in the last 168 hours. CBC: Recent Labs  Lab 11/20/20 0239 11/21/20 1258 11/23/20 0529 11/24/20 0521 11/25/20 0551  WBC 13.5* 14.1* 13.4* 13.4* 11.2*  NEUTROABS  --  11.2* 10.0* 10.5* 8.5*  HGB 11.1* 10.7* 10.4* 10.6* 12.1*  HCT 35.3* 33.3* 33.7* 34.2* 38.3*  MCV 102.3* 100.9* 103.1* 102.1* 100.0  PLT 349 337 325 357 367   Cardiac Enzymes: No results for input(s): CKTOTAL, CKMB, CKMBINDEX, TROPONINI in the last 168 hours. BNP: Invalid input(s): POCBNP CBG: Recent Labs  Lab 11/24/20 1208 11/24/20 1646 11/24/20 2130 11/25/20 0750 11/25/20 1216  GLUCAP 104* 110* 103* 89 111*   D-Dimer No results for input(s): DDIMER in the last 72 hours. Hgb A1c No results for input(s): HGBA1C in the last 72 hours. Lipid Profile No results for input(s): CHOL, HDL, LDLCALC, TRIG, CHOLHDL, LDLDIRECT in the last 72 hours. Thyroid function studies No results for input(s): TSH, T4TOTAL, T3FREE, THYROIDAB in the last 72 hours.  Invalid input(s): FREET3 Anemia work up No results for input(s): VITAMINB12, FOLATE, FERRITIN, TIBC, IRON, RETICCTPCT in the last 72 hours. Urinalysis    Component Value Date/Time   COLORURINE AMBER (A) 11/23/2020 1012   APPEARANCEUR CLOUDY (A) 11/23/2020 1012   LABSPEC 1.010 11/23/2020 1012   PHURINE 7.0 11/23/2020 1012   GLUCOSEU NEGATIVE 11/23/2020 1012   HGBUR LARGE (A) 11/23/2020 1012   BILIRUBINUR NEGATIVE 11/23/2020 1012   KETONESUR NEGATIVE 11/23/2020 1012   PROTEINUR 100 (A) 11/23/2020 1012   NITRITE POSITIVE (A) 11/23/2020 1012   LEUKOCYTESUR LARGE (A)  11/23/2020 1012   Sepsis Labs Invalid input(s): PROCALCITONIN,  WBC,  LACTICIDVEN Microbiology Recent Results (from the past 240 hour(s))  Resp Panel by RT-PCR (Flu A&B, Covid) Nasopharyngeal Swab     Status: None   Collection Time: 11/15/20  6:33 PM   Specimen: Nasopharyngeal Swab; Nasopharyngeal(NP) swabs in vial transport medium  Result Value Ref Range Status   SARS Coronavirus 2 by RT PCR NEGATIVE NEGATIVE Final    Comment: (NOTE) SARS-CoV-2 target nucleic acids are NOT DETECTED.  The SARS-CoV-2 RNA is generally detectable in upper respiratory specimens during the acute phase of infection. The lowest concentration of SARS-CoV-2 viral copies this assay can detect is 138 copies/mL. A negative result does not preclude SARS-Cov-2 infection and should not be used as the sole basis for treatment or other patient management decisions. A negative result may occur with  improper specimen collection/handling, submission of specimen other than nasopharyngeal swab, presence of viral mutation(s) within the areas targeted by this assay, and inadequate number of viral copies(<138 copies/mL). A negative result must be combined with clinical observations, patient history, and epidemiological information. The expected result is Negative.  Fact Sheet for Patients:  EntrepreneurPulse.com.au  Fact Sheet for  Healthcare Providers:  IncredibleEmployment.be  This test is no t yet approved or cleared by the [place] and  has been authorized for detection and/or diagnosis of SARS-CoV-2 by FDA under an Emergency Use Authorization (EUA). This EUA will remain  in effect (meaning this test can be used) for the duration of the COVID-19 declaration under Section 564(b)(1) of the Act, 21 U.S.C.section 360bbb-3(b)(1), unless the authorization is terminated  or revoked sooner.       Influenza A by PCR NEGATIVE NEGATIVE Final   Influenza B by PCR NEGATIVE  NEGATIVE Final    Comment: (NOTE) The Xpert Xpress SARS-CoV-2/FLU/RSV plus assay is intended as an aid in the diagnosis of influenza from Nasopharyngeal swab specimens and should not be used as a sole basis for treatment. Nasal washings and aspirates are unacceptable for Xpert Xpress SARS-CoV-2/FLU/RSV testing.  Fact Sheet for Patients: EntrepreneurPulse.com.au  Fact Sheet for Healthcare Providers: IncredibleEmployment.be  This test is not yet approved or cleared by the [place] FDA and has been authorized for detection and/or diagnosis of SARS-CoV-2 by FDA under an Emergency Use Authorization (EUA). This EUA will remain in effect (meaning this test can be used) for the duration of the COVID-19 declaration under Section 564(b)(1) of the Act, 21 U.S.C. section 360bbb-3(b)(1), unless the authorization is terminated or revoked.  Performed at [facility], [place] [address], [place], [place] [address]   Culture, blood (routine x 2)     Status: Abnormal (Preliminary result)   Collection Time: 11/17/20 12:47 PM   Specimen: BLOOD  Result Value Ref Range Status   Specimen Description   Final    BLOOD RIGHT ANTECUBITAL Performed at [place] [address], [place], [place] [address]    Special Requests   Final    BOTTLES DRAWN AEROBIC AND ANAEROBIC Blood Culture adequate volume Performed at [place] [address] [place], [place] [address]    Culture  Setup Time   Final    YEAST AEROBIC BOTTLE ONLY CRITICAL RESULT CALLED TO, READ BACK BY AND VERIFIED WITH: TERRY GREEN PHARMD @1002  11/21/20 EB    Culture (A)  Final    CANDIDA ALBICANS Sent to [place] for further susceptibility testing. Performed at [facility], [place] [address], [place], [place] [address]    Report Status PENDING  Incomplete  Antifungal AST 9 Drug Panel     Status: None (Preliminary result)    Collection Time: 11/17/20 12:47 PM  Result Value Ref Range Status   Organism ID, Yeast Preliminary report  Final    Comment: (NOTE) Specimen has been received and testing has been initiated. Performed At: [facility] [place], [place] [id number] Rush Farmer MD [id number]    Amphotericin B MIC PENDING  Incomplete   Please Note: PENDING  Incomplete   Anidulafungin MIC PENDING  Incomplete   Caspofungin MIC PENDING  Incomplete   Micafungin MIC PENDING  Incomplete   Posaconazole MIC PENDING  Incomplete   Please Note: PENDING  Incomplete   Fluconazole Islt MIC PENDING  Incomplete   Flucytosine MIC PENDING  Incomplete   Itraconazole MIC PENDING  Incomplete   Voriconazole MIC PENDING  Incomplete   Source CANDIDA ALBICANS FROM BLOOD CULTURE  Final    Comment: Performed at [facility], [place] [address] [place], [place] [address]  Culture, Urine     Status: Abnormal   Collection Time: 11/17/20  1:40 PM   Specimen: Urine, Random  Result Value Ref Range Status   Specimen Description   Final    URINE, RANDOM Performed at [place] [address], [place], [place] [address]    Special Requests   Final    NONE Performed at [facility], [place] [address], [place], [place] [address]    Culture MULTIPLE SPECIES PRESENT, SUGGEST RECOLLECTION (A)  Final   Report Status 11/19/2020 FINAL  Final  MRSA PCR Screening     Status: None   Collection Time: 11/17/20  3:49 PM   Specimen: Nasal Mucosa; Nasopharyngeal  Result Value Ref Range Status   MRSA by PCR NEGATIVE NEGATIVE Final    Comment:        The GeneXpert MRSA Assay (FDA approved for NASAL specimens only), is one component of a comprehensive MRSA colonization surveillance program. It is not intended to diagnose MRSA infection nor to guide or monitor treatment for MRSA infections. Performed at [facility], [place] [address] [place], [place]  [address]   Culture, blood (Routine X 2) w Reflex to ID Panel     Status: None (Preliminary result)   Collection Time: 11/21/20 12:58 PM   Specimen: BLOOD LEFT HAND  Result Value Ref Range Status   Specimen Description   Final    BLOOD LEFT HAND Performed at [place] [address], [place], [place] [address]    Special Requests   Final    BOTTLES DRAWN AEROBIC AND ANAEROBIC Blood Culture adequate volume Performed at [place] [address] [place], [place] [address]    Culture   Final    NO GROWTH 4 DAYS Performed at [facility], [place] [address] [place], [place] [address]    Report Status PENDING  Incomplete  Culture, blood (Routine X 2) w Reflex to ID Panel     Status: None (Preliminary result)   Collection Time: 11/21/20 12:58 PM   Specimen: BLOOD RIGHT HAND  Result Value Ref Range Status   Specimen Description   Final    BLOOD RIGHT HAND Performed at [place] [address], [place], [place] [address]    Special Requests   Final    BOTTLES DRAWN AEROBIC AND ANAEROBIC Blood Culture adequate volume Performed at [place] [address] [place], [place] [address]    Culture   Final    NO GROWTH 4 DAYS Performed at [facility], [place] [address] [place], [place] [address]    Report Status PENDING  Incomplete  Resp Panel by RT-PCR (Flu A&B, Covid) Nasopharyngeal Swab     Status: None   Collection Time: 11/24/20  4:40 PM   Specimen: Nasopharyngeal Swab; Nasopharyngeal(NP) swabs in vial transport medium  Result Value Ref Range Status   SARS Coronavirus 2 by RT PCR NEGATIVE NEGATIVE Final    Comment: (NOTE) SARS-CoV-2 target nucleic acids are NOT DETECTED.  The SARS-CoV-2 RNA is generally detectable in upper respiratory specimens during the acute phase of infection. The lowest concentration of SARS-CoV-2 viral copies this assay can detect is 138 copies/mL. A negative  result does not preclude SARS-Cov-2 infection and should not be used as the sole basis for treatment or other patient management decisions. A negative result may occur with  improper specimen collection/handling, submission of specimen other than nasopharyngeal swab, presence of viral mutation(s) within the areas targeted by this assay, and inadequate number of viral copies(<138 copies/mL). A negative result must be  combined with clinical observations, patient history, and epidemiological information. The expected result is Negative.  Fact Sheet for Patients:  EntrepreneurPulse.com.au  Fact Sheet for Healthcare Providers:  IncredibleEmployment.be  This test is no t yet approved or cleared by the [place] FDA and  has been authorized for detection and/or diagnosis of SARS-CoV-2 by FDA under an Emergency Use Authorization (EUA). This EUA will remain  in effect (meaning this test can be used) for the duration of the COVID-19 declaration under Section 564(b)(1) of the Act, 21 U.S.C.section 360bbb-3(b)(1), unless the authorization is terminated  or revoked sooner.       Influenza A by PCR NEGATIVE NEGATIVE Final   Influenza B by PCR NEGATIVE NEGATIVE Final    Comment: (NOTE) The Xpert Xpress SARS-CoV-2/FLU/RSV plus assay is intended as an aid in the diagnosis of influenza from Nasopharyngeal swab specimens and should not be used as a sole basis for treatment. Nasal washings and aspirates are unacceptable for Xpert Xpress SARS-CoV-2/FLU/RSV testing.  Fact Sheet for Patients: EntrepreneurPulse.com.au  Fact Sheet for Healthcare Providers: IncredibleEmployment.be  This test is not yet approved or cleared by the [place] FDA and has been authorized for detection and/or diagnosis of SARS-CoV-2 by FDA under an Emergency Use Authorization (EUA). This EUA will remain in effect (meaning this test can be used)  for the duration of the COVID-19 declaration under Section 564(b)(1) of the Act, 21 U.S.C. section 360bbb-3(b)(1), unless the authorization is terminated or revoked.  Performed at [facility], [place] [address], [place], [place] [address]     Please note: You were cared for by a hospitalist during your hospital stay. Once you are discharged, your primary care physician will handle any further medical issues. Please note that NO REFILLS for any discharge medications will be authorized once you are discharged, as it is imperative that you return to your primary care physician (or establish a relationship with a primary care physician if you do not have one) for your post hospital discharge needs so that they can reassess your need for medications and monitor your lab values.    Time coordinating discharge: 40 minutes  SIGNED:   Shelly Coss, MD  [facility] 11/25/2020, 12:31 PM Pager [phone]  If 7PM-7AM, please contact night-coverage [email or web address] Password [email or web address]

## 2020-11-25 NOTE — Progress Notes (Signed)
Physical Therapy Treatment Patient Details Name: Zachary Shannon. MRN: [id number] DOB: 21-Jun-1951 Today's Date: 11/25/2020    History of Present Illness 70 year old with PMH significant of bladder tumor status post recent cystoprostatectomy on April 6 with placement of ileal conduit and lateral stents.  Patient presented to the ER complaining of flank and suprapubic pain and confusion. Pt admitted for acute renal failure. Transferred to ICU 4/27 for cardiac monitoring    PT Comments    Patient readily agreed to ambulation. While ambulating , patient reported felling dizzy. Returned to room after~40'. BP 128/89.The current medical regimen is effective;  continue present plan and medications.  Progressive ambulation.  Follow Up Recommendations  SNF     Equipment Recommendations  Rolling walker with 5" wheels    Recommendations for Other Services       Precautions / Restrictions Precautions Precautions: Fall Precaution Comments: urostomy, C/O dizziness    Mobility  Bed Mobility   Bed Mobility: Supine to Sit     Supine to sit: Supervision          Transfers Overall transfer level: Needs assistance Equipment used: Rolling walker (2 wheeled) Transfers: Sit to/from Stand Sit to Stand: Min guard         General transfer comment: min guard from bed.  Ambulation/Gait Ambulation/Gait assistance: Min guard;Min assist Gait Distance (Feet): 40 Feet Assistive device: Rolling walker (2 wheeled) Gait Pattern/deviations: Step-through pattern;Trunk flexed Gait velocity: decreased   General Gait Details: patient  began C/O felling dizzy. retuened to room.   Stairs             Wheelchair Mobility    Modified Rankin (Stroke Patients Only)       Balance Overall balance assessment: Needs assistance Sitting-balance support: Feet supported Sitting balance-Leahy Scale: Good     Standing balance support: Bilateral upper extremity supported Standing  balance-Leahy Scale: Fair Standing balance comment: reliant on UE support                            Cognition Arousal/Alertness: Awake/alert Behavior During Therapy: Flat affect                                          Exercises      General Comments        Pertinent Vitals/Pain Faces Pain Scale: Hurts a little bit Pain Location: mid back Pain Descriptors / Indicators: Grimacing Pain Intervention(s): Monitored during session;Patient requesting pain meds-RN notified    Home Living                      Prior Function            PT Goals (current goals can now be found in the care plan section) Progress towards PT goals: Progressing toward goals    Frequency    Min 2X/week      PT Plan Current plan remains appropriate;Frequency needs to be updated    Co-evaluation              AM-PAC PT "6 Clicks" Mobility   Outcome Measure  Help needed turning from your back to your side while in a flat bed without using bedrails?: A Little Help needed moving from lying on your back to sitting on the side of a flat bed without using bedrails?: A Little Help  needed moving to and from a bed to a chair (including a wheelchair)?: A Little Help needed standing up from a chair using your arms (e.g., wheelchair or bedside chair)?: A Little Help needed to walk in hospital room?: A Little Help needed climbing 3-5 steps with a railing? : A Lot 6 Click Score: 17    End of Session Equipment Utilized During Treatment: Gait belt Activity Tolerance: Patient limited by fatigue Patient left: in chair;with call bell/phone within reach Nurse Communication: Mobility status PT Visit Diagnosis: Other abnormalities of gait and mobility (R26.89)     Time: 8299-3716 PT Time Calculation (min) (ACUTE ONLY): 21 min  Charges:  $Gait Training: 8-22 mins                    Camilla Pager [phone] Office  [phone]    Claretha Cooper 11/25/2020, 1:57 PM

## 2020-11-26 LAB — CULTURE, BLOOD (ROUTINE X 2)
Culture: NO GROWTH
Culture: NO GROWTH
Special Requests: ADEQUATE
Special Requests: ADEQUATE

## 2020-11-30 LAB — CULTURE, BLOOD (ROUTINE X 2): Special Requests: ADEQUATE

## 2020-12-06 ENCOUNTER — Ambulatory Visit (INDEPENDENT_AMBULATORY_CARE_PROVIDER_SITE_OTHER): Payer: Medicare Other | Admitting: Infectious Disease

## 2020-12-06 ENCOUNTER — Other Ambulatory Visit: Payer: Self-pay

## 2020-12-06 VITALS — BP 106/70 | HR 81 | Temp 97.8°F | Wt 237.0 lb

## 2020-12-06 DIAGNOSIS — N12 Tubulo-interstitial nephritis, not specified as acute or chronic: Secondary | ICD-10-CM

## 2020-12-06 DIAGNOSIS — E782 Mixed hyperlipidemia: Secondary | ICD-10-CM

## 2020-12-06 DIAGNOSIS — C679 Malignant neoplasm of bladder, unspecified: Secondary | ICD-10-CM | POA: Diagnosis not present

## 2020-12-06 DIAGNOSIS — B377 Candidal sepsis: Secondary | ICD-10-CM

## 2020-12-06 DIAGNOSIS — I480 Paroxysmal atrial fibrillation: Secondary | ICD-10-CM

## 2020-12-06 NOTE — Progress Notes (Signed)
Subjective:  Chief complaint follow-up for fungemia    Patient ID: Zachary Hay., male    DOB: 05-01-1951, 70 y.o.   MRN: [id number]  HPI   70 y.o. male with high-grade bladder cancer status post cystoscopy with a radical cystoprostatectomy and ileal conduit creation pelvic node dissection bilateral inguinal hernia repair laparoscopically ureteral stent placement, now admitted with hematuria concern for sepsis, with blood cultures positive for Candida albicans  He is currently on 400 mg of fluconazole which he is going to finish today before going over to 200 mg of fluconazole that he will continue through removal of his stents by urology on May 26.  He still has not had a dilated funduscopic exam by ophthalmology.  Dr. Valetta Close had been called for formal ophthalmology consult and evaluation while the patient was an inpatient but had refused to come in because of his reasoning that the patient only had 1 positive blood culture--though only 1 blood culture has been taken.  The patient has been arranged for funduscopic eye exam with Dr. Valetta Close but not until June 20.  Patient and his wife.  If we could have him seen by a local ophthalmologist in [place].  They have apparently seen by Dr. Riki Sheer there before who performed cataract surgery on Zachary Shannon.     Past Medical History:  Diagnosis Date  . Anticoagulant long-term use    eliquis--- managed by cardiology  . Bladder tumor   . Cancer ([place])    bladder  . Cardiomyopathy [facility])    dx 2009 per cardiac cath w/ ef 20-25%;   per cardiology note   . Coronary artery disease cardiologist--- dr Ronnell Freshwater  ([facility] in [place] )   per cardiologist note dated 09-16-2019 , received via fax,  pt had cardiac cath 2009 done at [facility] that showed CTO of LAD with excellent collateralization from LCx and RCA, no PCI performed, sig. cardiomyopathy ef 20-25%;  per note stated  improved to 45-50% (no documentation available in note and no echo)  . Dysrhythmia    Afib  . Hematuria   . Hyperlipidemia   . Hypertension    followed by pcp  . Nocturia   . PAF (paroxysmal atrial fibrillation) [facility])    cardiologist--- dr Ronnell Freshwater--- dx 2009    Past Surgical History:  Procedure Laterality Date  . CARDIAC CATHETERIZATION  2009   @[facility]   per pt done due to dx AFib;  told no blockage's  . CATARACT EXTRACTION W/ INTRAOCULAR LENS  IMPLANT, BILATERAL  02/2019  . CYSTOSCOPY WITH INJECTION N/A 10/27/2020   Procedure: CYSTOSCOPY WITH INJECTION OF INDOCYANINE GREEN DYE;  Surgeon: Alexis Frock, MD;  Location: [facility];  Service: Urology;  Laterality: N/A;  . LYMPH NODE DISSECTION Bilateral 10/27/2020   Procedure: LYMPH NODE DISSECTION;  Surgeon: Alexis Frock, MD;  Location: [facility];  Service: Urology;  Laterality: Bilateral;  . ROBOT ASSISTED LAPAROSCOPIC COMPLETE CYSTECT ILEAL CONDUIT N/A 10/27/2020   Procedure: XI ROBOTIC ASSISTED LAPAROSCOPIC COMPLETE CYSTECT ILEAL CONDUIT, RADICAL PROSTATECTOMY AND BILATERAL INGUINAL HERNIA REPAIR;  Surgeon: Alexis Frock, MD;  Location: [facility];  Service: Urology;  Laterality: N/A;  6 HRS  . TRANSURETHRAL RESECTION OF BLADDER TUMOR N/A 10/29/2019   Procedure: TRANSURETHRAL RESECTION OF BLADDER TUMOR  (TURBT), CYSTOSCOPY/ INTRAVESICAL INSTILLATION OF GEMCITABINE;  Surgeon: Ceasar Mons, MD;  Location: [facility];  Service: Urology;  Laterality: N/A;  . TRANSURETHRAL RESECTION OF BLADDER TUMOR N/A 07/14/2020  Procedure: TRANSURETHRAL RESECTION OF BLADDER TUMOR (TURBT) WITH CYSTOSCOPY/ GEMCITABINE INSTILLATION;  Surgeon: Ceasar Mons, MD;  Location: [facility];  Service: Urology;  Laterality: N/A;    No family history on file.    Social History   Socioeconomic History  . Marital status: Divorced    Spouse name: Not on file  . Number of children: Not on file  . Years of  education: Not on file  . Highest education level: Not on file  Occupational History  . Not on file  Tobacco Use  . Smoking status: Former Smoker    Packs/day: 0.50    Years: 28.00    Pack years: 14.00    Types: Cigarettes    Quit date: 10/08/2008    Years since quitting: 12.1  . Smokeless tobacco: Former Systems developer    Quit date: 10/08/2008  Vaping Use  . Vaping Use: Never used  Substance and Sexual Activity  . Alcohol use: Never  . Drug use: Never  . Sexual activity: Not on file  Other Topics Concern  . Not on file  Social History Narrative  . Not on file   Social Determinants of Health   Financial Resource Strain: Not on file  Food Insecurity: Not on file  Transportation Needs: Not on file  Physical Activity: Not on file  Stress: Not on file  Social Connections: Not on file    No Known Allergies   Current Outpatient Medications:  .  apixaban (ELIQUIS) 5 MG TABS tablet, Take 5 mg by mouth 2 (two) times daily., Disp: , Rfl:  .  atorvastatin (LIPITOR) 10 MG tablet, Take 10 mg by mouth at bedtime., Disp: , Rfl:  .  diltiazem (DILACOR XR) 240 MG 24 hr capsule, Take 240 mg by mouth daily., Disp: , Rfl:  .  docusate sodium (COLACE) 100 MG capsule, Take 1 capsule (100 mg total) by mouth daily., Disp: 10 capsule, Rfl: 0 .  fluconazole (DIFLUCAN) 200 MG tablet, Take 2 tablets (400 mg total) by mouth daily for 10 days., Disp: 20 tablet, Rfl: 0 .  fluconazole (DIFLUCAN) 200 MG tablet, Take 1 tablet (200 mg total) by mouth daily., Disp: , Rfl:  .  furosemide (LASIX) 40 MG tablet, Take 1 tablet (40 mg total) by mouth daily for 5 days., Disp: 5 tablet, Rfl: 0 .  lidocaine (LIDODERM) 5 %, Place 1 patch onto the skin daily. Remove & Discard patch within 12 hours or as directed by MD, Disp: 30 patch, Rfl: 0 .  methocarbamol (ROBAXIN) 500 MG tablet, Take 1 tablet (500 mg total) by mouth every 6 (six) hours as needed for muscle spasms., Disp: , Rfl:  .  metoprolol tartrate (LOPRESSOR) 50 MG  tablet, Take 25 mg by mouth 2 (two) times daily., Disp: , Rfl:  .  oxyCODONE-acetaminophen (PERCOCET) 5-325 MG tablet, Take 0.5-1 tablets by mouth every 6 (six) hours as needed for moderate pain or severe pain. Post-operatively, Disp: 10 tablet, Rfl: 0 .  pantoprazole (PROTONIX) 40 MG tablet, Take 1 tablet (40 mg total) by mouth daily., Disp: , Rfl:  .  sodium bicarbonate 650 MG tablet, Take 2 tablets (1,300 mg total) by mouth 2 (two) times daily., Disp: , Rfl:  No current facility-administered medications for this visit.  Facility-Administered Medications Ordered in Other Visits:  .  gemcitabine (GEMZAR) chemo syringe for bladder instillation 2,000 mg, 2,000 mg, Bladder Instillation, Once, Winter, [place], MD   Review of Systems  Constitutional: Negative for activity change, appetite change, chills,  diaphoresis, fatigue, fever and unexpected weight change.  HENT: Negative for congestion, rhinorrhea, sinus pressure, sneezing, sore throat and trouble swallowing.   Eyes: Negative for photophobia and visual disturbance.  Respiratory: Negative for cough, chest tightness, shortness of breath, wheezing and stridor.   Cardiovascular: Negative for chest pain, palpitations and leg swelling.  Gastrointestinal: Negative for abdominal distention, abdominal pain, anal bleeding, blood in stool, constipation, diarrhea, nausea and vomiting.  Genitourinary: Negative for difficulty urinating, dysuria, flank pain and hematuria.  Musculoskeletal: Negative for arthralgias, back pain, gait problem, joint swelling and myalgias.  Skin: Negative for color change, pallor, rash and wound.  Neurological: Negative for dizziness, tremors, weakness and light-headedness.  Hematological: Negative for adenopathy. Does not bruise/bleed easily.  Psychiatric/Behavioral: Negative for agitation, behavioral problems, confusion, decreased concentration, dysphoric mood and sleep disturbance.       Objective:   Physical  Exam Constitutional:      Appearance: He is well-developed.  HENT:     Head: Normocephalic and atraumatic.  Eyes:     Conjunctiva/sclera: Conjunctivae normal.  Cardiovascular:     Rate and Rhythm: Normal rate and regular rhythm.  Pulmonary:     Effort: Pulmonary effort is normal. No respiratory distress.     Breath sounds: No wheezing.  Abdominal:     General: There is no distension.     Palpations: Abdomen is soft.    Musculoskeletal:        General: No tenderness. Normal range of motion.     Cervical back: Normal range of motion and neck supple.  Skin:    General: Skin is warm and dry.     Coloration: Skin is not pale.     Findings: No erythema or rash.  Neurological:     General: No focal deficit present.     Mental Status: He is alert and oriented to person, place, and time.  Psychiatric:        Mood and Affect: Mood normal.        Behavior: Behavior normal.        Thought Content: Thought content normal.        Judgment: Judgment normal.           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  Stents are going to be removed on the 26th of this month.  We will continue fluconazole now at 200 mg daily through his surgery which is on the 26.  We are endeavoring to have him seen by an ophthalmologist in a timely manner so that fungal endophthalmitis cannot be excluded.  June 28 is quite a ways out and seems at that point to be a pointless exercise.  Plan on seeing him 2 weeks or more after he finishes his antifungals check for surveillance blood cultures.  Hyperlipidemia is on atorvastatin but at reduced dose 10 mg in the context of fluconazole therapy  I spent greater than 40 minutes with the patient including greater than 50% of time in face to face counsel of the patient guarding his candidemia his bladder cancer, stents reviewing his pertinent radiographic data  laboratory data and microbiology data and in coordination of his care.

## 2020-12-07 ENCOUNTER — Telehealth: Payer: Self-pay

## 2020-12-07 NOTE — Telephone Encounter (Signed)
Patient daughter Eustaquio Maize) called to verify the new dose of fluconazole that the patient should be taking and if a new prescription will be sent to the pharmacy. Advised the patient's daughter that the patient should be taking 200 mg daily per Dr. Derek Mound office visit note on 12/06/20. Patient's daughter stated she believes patient last fluconazole prescription that was sent was for 200 mg tablets and she will call the pharmacy to confirm if they still have it and will pick up that prescription. If the pharmacy no long has that prescription she will call our office back to get a new RX sent. Charlesia Canaday T Brooks Sailors

## 2020-12-31 ENCOUNTER — Ambulatory Visit (INDEPENDENT_AMBULATORY_CARE_PROVIDER_SITE_OTHER): Payer: Medicare Other | Admitting: Infectious Disease

## 2020-12-31 ENCOUNTER — Other Ambulatory Visit: Payer: Self-pay

## 2020-12-31 VITALS — BP 103/71 | HR 57 | Resp 16 | Ht 72.0 in | Wt 239.0 lb

## 2020-12-31 DIAGNOSIS — L853 Xerosis cutis: Secondary | ICD-10-CM | POA: Diagnosis not present

## 2020-12-31 DIAGNOSIS — I480 Paroxysmal atrial fibrillation: Secondary | ICD-10-CM | POA: Diagnosis not present

## 2020-12-31 DIAGNOSIS — B377 Candidal sepsis: Secondary | ICD-10-CM | POA: Diagnosis present

## 2020-12-31 DIAGNOSIS — C679 Malignant neoplasm of bladder, unspecified: Secondary | ICD-10-CM | POA: Diagnosis not present

## 2020-12-31 DIAGNOSIS — N12 Tubulo-interstitial nephritis, not specified as acute or chronic: Secondary | ICD-10-CM | POA: Diagnosis not present

## 2020-12-31 NOTE — Progress Notes (Signed)
Subjective:  Chief complaint follow-up for fungemia planing of dry skin on his arms and legs.    Patient ID: Zachary Shannon., male    DOB: 11/04/1950, 70 y.o.   MRN: [id number]  HPI   70 y.o. male with high-grade bladder cancer status post cystoscopy with a radical cystoprostatectomy and ileal conduit creation pelvic node dissection bilateral inguinal hernia repair laparoscopically ureteral stent placement, now admitted with hematuria concern for sepsis, with blood cultures positive for Candida albicans   He is currently on 400 mg of fluconazole which he is going to finish today before going over to 200 mg of fluconazole that he will continue through removal of his stents by urology on May 26.  He still has not had a dilated funduscopic exam by ophthalmology.  Dr. Valetta Close had been called for formal ophthalmology consult and evaluation while the patient was an inpatient but had refused to come in because of his reasoning that the patient only had 1 positive blood culture--though only 1 blood culture has been taken.  The patient has been arranged for funduscopic eye exam with Dr. Valetta Close but not until June 20.  We are able to get him in with his local ophthalmologist that he had seen before and he was found to have no evidence of fungal endophthalmitis.  Since I last saw him he also had removal of all of his stents he still remains on fluconazole  Dry skin on his arms and legs and wonders whether this could be related fluconazole is certainly not typical finding of seen with people on chronic nasal therapy.  I do not think he needs no more fluconazole at this point though.   Past Medical History:  Diagnosis Date   Anticoagulant long-term use    eliquis--- managed by cardiology   Bladder tumor    Cancer [facility])    bladder   Cardiomyopathy ([place])    dx 2009 per cardiac cath w/ ef 20-25%;   per cardiology note    Coronary artery disease cardiologist--- dr Ronnell Freshwater   ([facility] [place] )   per cardiologist note dated 09-16-2019 , received via fax,  pt had cardiac cath 2009 done at [facility] that showed CTO of LAD with excellent collateralization from LCx and RCA, no PCI performed, sig. cardiomyopathy ef 20-25%;  per note stated improved to 45-50% (no documentation available in note and no echo)   Dysrhythmia    Afib   Hematuria    Hyperlipidemia    Hypertension    followed by pcp   Nocturia    PAF (paroxysmal atrial fibrillation) [facility])    cardiologist--- dr Ronnell Freshwater--- dx 2009    Past Surgical History:  Procedure Laterality Date   CARDIAC CATHETERIZATION  2009   @[place]  [facility]   per pt done due to dx AFib;  told no blockage's   CATARACT EXTRACTION W/ INTRAOCULAR LENS  IMPLANT, BILATERAL  02/2019   CYSTOSCOPY WITH INJECTION N/A 10/27/2020   Procedure: CYSTOSCOPY WITH INJECTION OF INDOCYANINE GREEN DYE;  Surgeon: Alexis Frock, MD;  [facility];  Service: Urology;  Laterality: N/A;   LYMPH NODE DISSECTION Bilateral 10/27/2020   Procedure: LYMPH NODE DISSECTION;  Surgeon: Alexis Frock, MD;  [facility];  Service: Urology;  Laterality: Bilateral;   ROBOT ASSISTED LAPAROSCOPIC COMPLETE CYSTECT ILEAL CONDUIT N/A 10/27/2020   Procedure: XI ROBOTIC ASSISTED LAPAROSCOPIC COMPLETE CYSTECT ILEAL CONDUIT, RADICAL PROSTATECTOMY AND BILATERAL INGUINAL HERNIA REPAIR;  Surgeon: Alexis Frock, MD;  Location:  [facility];  Service: Urology;  Laterality: N/A;  6 HRS   TRANSURETHRAL RESECTION OF BLADDER TUMOR N/A 10/29/2019   Procedure: TRANSURETHRAL RESECTION OF BLADDER TUMOR  (TURBT), CYSTOSCOPY/ INTRAVESICAL INSTILLATION OF GEMCITABINE;  Surgeon: Ceasar Mons, MD;  Location: [facility];  Service: Urology;  Laterality: N/A;   TRANSURETHRAL RESECTION OF BLADDER TUMOR N/A 07/14/2020   Procedure: TRANSURETHRAL RESECTION OF BLADDER TUMOR (TURBT) WITH CYSTOSCOPY/ GEMCITABINE INSTILLATION;   Surgeon: Ceasar Mons, MD;  [facility];  Service: Urology;  Laterality: N/A;    No family history on file.    Social History   Socioeconomic History   Marital status: Divorced    Spouse name: Not on file   Number of children: Not on file   Years of education: Not on file   Highest education level: Not on file  Occupational History   Not on file  Tobacco Use   Smoking status: Former    Packs/day: 0.50    Years: 28.00    Pack years: 14.00    Types: Cigarettes    Quit date: 10/08/2008    Years since quitting: 12.2   Smokeless tobacco: Former    Quit date: 10/08/2008  Vaping Use   Vaping Use: Never used  Substance and Sexual Activity   Alcohol use: Never   Drug use: Never   Sexual activity: Not on file  Other Topics Concern   Not on file  Social History Narrative   Not on file   Social Determinants of Health   Financial Resource Strain: Not on file  Food Insecurity: Not on file  Transportation Needs: Not on file  Physical Activity: Not on file  Stress: Not on file  Social Connections: Not on file    No Known Allergies   Current Outpatient Medications:    apixaban (ELIQUIS) 5 MG TABS tablet, Take 5 mg by mouth 2 (two) times daily., Disp: , Rfl:    atorvastatin (LIPITOR) 10 MG tablet, Take 10 mg by mouth at bedtime., Disp: , Rfl:    diltiazem (DILACOR XR) 240 MG 24 hr capsule, Take 240 mg by mouth daily., Disp: , Rfl:    docusate sodium (COLACE) 100 MG capsule, Take 1 capsule (100 mg total) by mouth daily., Disp: 10 capsule, Rfl: 0   fluconazole (DIFLUCAN) 200 MG tablet, Take 1 tablet (200 mg total) by mouth daily., Disp: , Rfl:    lidocaine (LIDODERM) 5 %, Place 1 patch onto the skin daily. Remove & Discard patch within 12 hours or as directed by MD, Disp: 30 patch, Rfl: 0   methocarbamol (ROBAXIN) 500 MG tablet, Take 1 tablet (500 mg total) by mouth every 6 (six) hours as needed for muscle spasms., Disp: , Rfl:    metoprolol tartrate (LOPRESSOR)  50 MG tablet, Take 25 mg by mouth 2 (two) times daily., Disp: , Rfl:    pantoprazole (PROTONIX) 40 MG tablet, Take 1 tablet (40 mg total) by mouth daily., Disp: , Rfl:    sodium bicarbonate 650 MG tablet, Take 2 tablets (1,300 mg total) by mouth 2 (two) times daily., Disp: , Rfl:    furosemide (LASIX) 40 MG tablet, Take 1 tablet (40 mg total) by mouth daily for 5 days., Disp: 5 tablet, Rfl: 0   oxyCODONE-acetaminophen (PERCOCET) 5-325 MG tablet, Take 0.5-1 tablets by mouth every 6 (six) hours as needed for moderate pain or severe pain. Post-operatively (Patient not taking: Reported on 12/31/2020), Disp: 10 tablet, Rfl: 0 No current facility-administered medications for  this visit.  Facility-Administered Medications Ordered in Other Visits:    gemcitabine (GEMZAR) chemo syringe for bladder instillation 2,000 mg, 2,000 mg, Bladder Instillation, Once, Winter, [place], MD   Review of Systems  Constitutional:  Negative for activity change, appetite change, chills, diaphoresis, fatigue, fever and unexpected weight change.  HENT:  Negative for congestion, rhinorrhea, sinus pressure, sneezing, sore throat and trouble swallowing.   Eyes:  Negative for photophobia and visual disturbance.  Respiratory:  Negative for cough, chest tightness, shortness of breath, wheezing and stridor.   Cardiovascular:  Negative for chest pain, palpitations and leg swelling.  Gastrointestinal:  Negative for abdominal distention, abdominal pain, anal bleeding, blood in stool, constipation, diarrhea, nausea and vomiting.  Genitourinary:  Negative for difficulty urinating, dysuria, flank pain and hematuria.  Musculoskeletal:  Negative for arthralgias, back pain, gait problem, joint swelling and myalgias.  Skin:  Positive for rash. Negative for color change, pallor and wound.  Neurological:  Negative for dizziness, tremors, weakness and light-headedness.  Hematological:  Negative for adenopathy. Does not bruise/bleed  easily.  Psychiatric/Behavioral:  Negative for agitation, behavioral problems, confusion, decreased concentration, dysphoric mood and sleep disturbance.       Objective:   Physical Exam Constitutional:      Appearance: He is well-developed.  HENT:     Head: Normocephalic and atraumatic.  Eyes:     Conjunctiva/sclera: Conjunctivae normal.  Cardiovascular:     Rate and Rhythm: Normal rate and regular rhythm.  Pulmonary:     Effort: Pulmonary effort is normal. No respiratory distress.     Breath sounds: No wheezing.  Abdominal:     General: There is no distension.     Palpations: Abdomen is soft.    Musculoskeletal:        General: No tenderness. Normal range of motion.     Cervical back: Normal range of motion and neck supple.  Skin:    General: Skin is warm and dry.     Coloration: Skin is not pale.     Findings: Erythema and rash present.  Neurological:     General: No focal deficit present.     Mental Status: He is alert and oriented to person, place, and time.  Psychiatric:        Mood and Affect: Mood normal.        Behavior: Behavior normal.        Thought Content: Thought content normal.        Judgment: Judgment normal.    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  He has had a dilated funduscopic exam with his local ophthalmologist that showed no evidence of fungal endophthalmitis.  He has had stents removed.  He can stop his fluconazole.  I do not feel strong at this point that we have to get surveillance blood cultures to prove that he is cleared his fungemia.   Hyperlipidemia i his atorvastatin been reduced in the context of fluconazole this can be increased back to the prior dose.  Dry skin not quite sure what is causing this but have recommended using Eucerin cream and if it does not resolve seeing a  dermatologist.  I spent more than 30 minutes with the patient including greater than 50% of time in face to face counseling of the patient personally reviewing radiographs,  along with pertinent laboratory microbiological data review of medical records and in coordination of his care.

## 2021-04-26 ENCOUNTER — Other Ambulatory Visit: Payer: Self-pay

## 2021-04-26 ENCOUNTER — Other Ambulatory Visit (HOSPITAL_COMMUNITY): Payer: Self-pay | Admitting: Urology

## 2021-04-26 ENCOUNTER — Ambulatory Visit (HOSPITAL_COMMUNITY)
Admission: RE | Admit: 2021-04-26 | Discharge: 2021-04-26 | Disposition: A | Discharge status: Home / Self Care | Payer: Medicare Other | Source: Ambulatory Visit | Attending: Urology | Admitting: Urology

## 2021-04-26 DIAGNOSIS — C678 Malignant neoplasm of overlapping sites of bladder: Secondary | ICD-10-CM | POA: Insufficient documentation

## 2021-10-24 ENCOUNTER — Other Ambulatory Visit (HOSPITAL_COMMUNITY): Payer: Self-pay | Admitting: Urology

## 2021-10-24 ENCOUNTER — Ambulatory Visit (HOSPITAL_COMMUNITY)
Admission: RE | Admit: 2021-10-24 | Discharge: 2021-10-24 | Disposition: A | Discharge status: Home / Self Care | Payer: Medicare Other | Source: Ambulatory Visit | Attending: Urology | Admitting: Urology

## 2021-10-24 DIAGNOSIS — C678 Malignant neoplasm of overlapping sites of bladder: Secondary | ICD-10-CM

## 2021-11-11 ENCOUNTER — Other Ambulatory Visit: Payer: Self-pay | Admitting: Urology

## 2021-11-22 NOTE — Progress Notes (Signed)
Sent message, via epic in basket, requesting orders in epic from surgeon.  

## 2021-11-23 ENCOUNTER — Other Ambulatory Visit (HOSPITAL_COMMUNITY): Payer: Self-pay | Admitting: Urology

## 2021-11-23 ENCOUNTER — Other Ambulatory Visit (HOSPITAL_COMMUNITY): Payer: Self-pay

## 2021-11-23 DIAGNOSIS — C652 Malignant neoplasm of left renal pelvis: Secondary | ICD-10-CM

## 2021-11-28 ENCOUNTER — Other Ambulatory Visit (HOSPITAL_COMMUNITY): Payer: Medicare Other

## 2021-11-28 NOTE — Patient Instructions (Addendum)
DUE TO COVID-19 ONLY TWO VISITORS  (aged 72 and older)  ARE ALLOWED TO COME WITH YOU AND STAY IN THE WAITING ROOM ONLY DURING PRE OP AND PROCEDURE.   ?**NO VISITORS ARE ALLOWED IN THE SHORT STAY AREA OR RECOVERY ROOM!!** ? ?IF YOU WILL BE ADMITTED INTO THE HOSPITAL YOU ARE ALLOWED ONLY FOUR SUPPORT PEOPLE DURING VISITATION HOURS ONLY (7 AM -8PM)   ?The support person(s) must pass our screening, gel in and out, and wear a mask at all times, including in the patient?s room. ?Patients must also wear a mask when staff or their support person are in the room. ?Visitors GUEST BADGE MUST BE WORN VISIBLY  ?One adult visitor may remain with you overnight and MUST be in the room by 8 P.M. ?  ? ? Your procedure is scheduled on: 12/07/21 ? ? Report to [facility] Main Entrance ? ?  Report to admitting at   10:15 AM ? ? Call this number if you have problems the morning of surgery [phone] ? ? Do not eat food :After Midnight. ? ? After Midnight you may have the following liquids until __6:30_ AM/  DAY OF SURGERY ? ?Water ?Black Coffee (sugar ok, NO MILK/CREAM OR CREAMERS)  ?Tea (sugar ok, NO MILK/CREAM OR CREAMERS) regular and decaf                             ?Plain Jell-O (NO RED)                                           ?Fruit ices (not with fruit pulp, NO RED)                                     ?Popsicles (NO RED)                                                                  ?Juice: apple, WHITE grape, WHITE cranberry ?Sports drinks like Gatorade (NO RED) ?Clear broth(vegetable,chicken,beef) ? ?        ?       If you have questions, please contact your surgeon?s office. ? ? ? Before surgery.Stop taking __Xarelto______Last dose on 5/14/23__________as instructed by Jonelle Sidle Plunk____________. ? ? ?Contact your Surgeon/Cardiologist for instructions on Anticoagulant Therapy prior to surgery. ?  ?  ?  ?Oral Hygiene is also important to reduce your risk of infection.                                    ?Remember -  BRUSH YOUR TEETH THE MORNING OF SURGERY WITH YOUR REGULAR TOOTHPASTE ? ? Do NOT smoke after Midnight ? ? Take these medicines the morning of surgery with A SIP OF WATER: Metoprolol, Diltiazem ? ?Bring CPAP mask and tubing day of surgery. ?                  ?  You may not have any metal on your body including jewelry, and body piercing ? ?           Do not wear  lotions, powders, perfumes/cologne, or deodorant ? ? ?            Men may shave face and neck. ? ? Do not bring valuables to the hospital. [place] NOT ?            RESPONSIBLE   FOR VALUABLES. ? ? Contacts, dentures or bridgework may not be worn into surgery. ? ? Bring small overnight bag day of surgery. ?  ? ? Special Instructions: Bring a copy of your healthcare power of attorney and living will documents  the day of surgery if you haven't scanned them before. ? ?            Please read over the following fact sheets you were given: IF [place] [phone] ? ?   [place] - Preparing for Surgery ?Before surgery, you can play an important role.  Because skin is not sterile, your skin needs to be as free of germs as possible.  You can reduce the number of germs on your skin by washing with CHG (chlorahexidine gluconate) soap before surgery.  CHG is an antiseptic cleaner which kills germs and bonds with the skin to continue killing germs even after washing. ?Please DO NOT use if you have an allergy to CHG or antibacterial soaps.  If your skin becomes reddened/irritated stop using the CHG and inform your nurse when you arrive at Short Stay. You may shave your face/neck. ?Please follow these instructions carefully: ? 1.  Shower with CHG Soap the night before surgery and the  morning of Surgery. ? 2.  If you choose to wash your hair, wash your hair first as usual with your  normal  shampoo. ? 3.  After you shampoo, rinse your hair and body thoroughly to remove the  shampoo.                        ?     4.  Use CHG as you would any other liquid soap.  You can apply chg directly  to the skin and wash  ?                     Gently with a scrungie or clean washcloth. ? 5.  Apply the CHG Soap to your body ONLY FROM THE NECK DOWN.   Do not use on face/ open      ?                     Wound or open sores. Avoid contact with eyes, ears mouth and genitals (private parts).  ?                     Production manager,  Genitals (private parts) with your normal soap. ?            6.  Wash thoroughly, paying special attention to the area where your surgery  will be performed. ? 7.  Thoroughly rinse your body with warm water from the neck down. ? 8.  DO NOT shower/wash with your normal soap after using and rinsing off  the CHG Soap. ?               9.  Pat yourself dry with a clean towel. ?           10.  Wear clean pajamas. ?           11.  Place clean sheets on your bed the night of your first shower and do not  sleep with pets. ?Day of Surgery : ?Do not apply any lotions/deodorants the morning of surgery.  Please wear clean clothes to the hospital/surgery center. ? ?FAILURE TO FOLLOW THESE INSTRUCTIONS MAY RESULT IN THE CANCELLATION OF YOUR SURGERY ? ? ?________________________________________________________________________  ?

## 2021-11-29 ENCOUNTER — Encounter (HOSPITAL_COMMUNITY)
Admission: RE | Admit: 2021-11-29 | Discharge: 2021-11-29 | Disposition: A | Discharge status: Home / Self Care | Payer: Medicare Other | Source: Ambulatory Visit | Attending: Urology | Admitting: Urology

## 2021-11-29 ENCOUNTER — Encounter (HOSPITAL_COMMUNITY): Payer: Self-pay

## 2021-11-29 ENCOUNTER — Other Ambulatory Visit: Payer: Self-pay

## 2021-11-29 DIAGNOSIS — Z01812 Encounter for preprocedural laboratory examination: Secondary | ICD-10-CM | POA: Insufficient documentation

## 2021-11-29 DIAGNOSIS — Z01818 Encounter for other preprocedural examination: Secondary | ICD-10-CM

## 2021-11-29 HISTORY — DX: Rheumatic tricuspid insufficiency: I07.1

## 2021-11-29 HISTORY — DX: Heart failure, unspecified: I50.9

## 2021-11-29 HISTORY — DX: Nonrheumatic mitral (valve) insufficiency: I34.0

## 2021-11-29 HISTORY — DX: Nonrheumatic aortic (valve) insufficiency: I35.1

## 2021-11-29 HISTORY — DX: Chronic kidney disease, unspecified: N18.9

## 2021-11-29 HISTORY — DX: Hyperlipidemia, unspecified: E78.5

## 2021-11-29 LAB — BASIC METABOLIC PANEL
Anion gap: 10 (ref 5–15)
BUN: 17 mg/dL (ref 8–23)
CO2: 24 mmol/L (ref 22–32)
Calcium: 9.4 mg/dL (ref 8.9–10.3)
Chloride: 105 mmol/L (ref 98–111)
Creatinine, Ser: 1.49 mg/dL — ABNORMAL HIGH (ref 0.61–1.24)
GFR, Estimated: 50 mL/min — ABNORMAL LOW (ref >60)
Glucose, Bld: 102 mg/dL — ABNORMAL HIGH (ref 70–99)
Potassium: 4 mmol/L (ref 3.5–5.1)
Sodium: 139 mmol/L (ref 135–145)

## 2021-11-29 LAB — CBC
HCT: 44.5 % (ref 39.0–52.0)
Hemoglobin: 14.6 g/dL (ref 13.0–17.0)
MCH: 32.8 pg (ref 26.0–34.0)
MCHC: 32.8 g/dL (ref 30.0–36.0)
MCV: 100 fL (ref 80.0–100.0)
Platelets: 262 10*3/uL (ref 150–400)
RBC: 4.45 MIL/uL (ref 4.22–5.81)
RDW: 14.3 % (ref 11.5–15.5)
WBC: 9.5 10*3/uL (ref 4.0–10.5)
nRBC: 0 % (ref 0.0–0.2)

## 2021-11-29 NOTE — Progress Notes (Signed)
Anesthesia note: ? ?Bowel prep reminder:NA ? ?PCP - Dr. Jene Every ?Cardiologist -Dr. Timmothy Sours ?Other-  ? ?Chest x-ray - 10/24/21-epic ?EKG - 06/27/21-CE requested. ?Stress Test - no ?ECHO - no ?Cardiac Cath - 2009 ? ?Pacemaker/ICD device last checked:NA ? ?Sleep Study - no ?CPAP -  ? ?Pt is pre diabetic-NA ?Fasting Blood Sugar -  ?Checks Blood Sugar _____ ? ?Blood Thinner:Xarelto for A-Fib/ Dr. Dianah Field ?Blood Thinner Instructions:stop 3 days prior to DOS. Pt stopped it on 11/28/21 for a procedure on 5/12. He will stay off of it until after the surgery on the 17th. It was okayed by Dr. Dianah Field ?Aspirin Instructions: ?Last Dose:11/28/21 ? ?Anesthesia review: yes ? ?Patient denies shortness of breath, fever, cough and chest pain at PAT appointment ?Pt has no SOB with activities.  ? ?Patient verbalized understanding of instructions that were given to them at the PAT appointment. Patient was also instructed that they will need to review over the PAT instructions again at home before surgery. yes ?

## 2021-12-01 ENCOUNTER — Other Ambulatory Visit: Payer: Self-pay | Admitting: Radiology

## 2021-12-01 DIAGNOSIS — C652 Malignant neoplasm of left renal pelvis: Secondary | ICD-10-CM

## 2021-12-01 NOTE — Progress Notes (Signed)
Anesthesia Chart Review ? ? Case: [id number] Date/Time: 12/07/21 1215  ? Procedures:  ?    ANTEGRADE URETEROSCOPY WITH BIOPSY AND NEPHROSTOMY TUBE EXCHANGE (Left) - 2 HRS ?    HOLMIUM LASER TUMOR ABLATION (Left)  ? Anesthesia type: General  ? 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 [facility]  ? Surgeons: Alexis Frock, MD  ? ?  ? ? ?DISCUSSION:71 y.o. 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 Alexis Frock.  ? ?Pt last seen by cardiology 06/27/2021. Stable at this visit.  Cleared to hold Xarelto 3 days prior to upcoming procedure.  ? ?Anticipate pt can proceed with planned procedure barring acute status change.   ?VS: BP 109/78   Pulse 66   Temp 36.7 ?C (Oral)   Resp 18   Ht 6' (1.829 m)   Wt 104.3 kg   SpO2 97%   BMI 31.19 kg/m?  ? ?PROVIDERS: ?Minna Antis, DO is PCP  ? ?Marcille Blanco, MD is Cardiologist  ?LABS: Labs reviewed: Acceptable for surgery. ?(all labs ordered are listed, but only abnormal results are displayed) ? ?Labs Reviewed  ?BASIC METABOLIC PANEL - Abnormal; Notable for the following components:  ?    Result Value  ? Glucose, Bld 102 (*)   ? Creatinine, Ser 1.49 (*)   ? GFR, Estimated 50 (*)   ? All other components within normal limits  ?CBC  ? ? ? ?IMAGES: ? ? ?EKG: ?11/17/2021 ?Rate 95 bpm  ?Atrial fibrillation  ? ?CV: ?Echo 11/18/2020 ?1. Left ventricular ejection fraction, by estimation, is 55 to 60%. The  ?left ventricle has normal function. The left ventricle has no regional  ?wall motion abnormalities. Left ventricular diastolic function could not  ?be evaluated.  ? 2. Right ventricular systolic function is mildly reduced. The right  ?ventricular size is mildly enlarged. There is moderately elevated  ?pulmonary artery systolic pressure. The estimated right ventricular  ?systolic pressure is 92.1 mmHg.  ? 3. Left atrial size was severely dilated.  ?  4. Right atrial size was moderately dilated.  ? 5. The mitral valve is grossly normal. Trivial mitral valve  ?regurgitation. No evidence of mitral stenosis.  ? 6. The aortic valve is tricuspid. There is mild calcification of the  ?aortic valve. There is mild thickening of the aortic valve. Aortic valve  ?regurgitation is not visualized. No aortic stenosis is present.  ? 7. Aortic dilatation noted. There is mild dilatation of the aortic root,  ?measuring 42 mm. There is mild dilatation of the aortic root, measuring 43  ?mm.  ? 8. The inferior vena cava is dilated in size with >50% respiratory  ?variability, suggesting right atrial pressure of 8 mmHg.  ?Past Medical History:  ?Diagnosis Date  ? Anticoagulant long-term use   ? eliquis--- managed by cardiology  ? Aortic insufficiency   ? Cancer [facility])   ? bladder  ? Cardiomyopathy ([place])   ? dx 2009 per cardiac cath w/ ef 20-25%;   per cardiology note   ? CHF (congestive heart failure) ([place])   ? Chronic kidney disease   ? Coronary artery disease cardiologist--- dr Ronnell Freshwater  ([facility] [place] )  ? per cardiologist note dated 09-16-2019 , received via fax,  pt had cardiac cath [facility] done at [facility] that showed CTO of LAD with excellent collateralization from LCx and RCA, no PCI performed, sig.  cardiomyopathy ef 20-25%;  per note stated improved to 45-50% (no documentation available in note and no echo)  ? Dysrhythmia   ? Afib  ? Hematuria   ? History of deep vein thrombosis (DVT) of lower extremity 2021  ? HLD (hyperlipidemia)   ? Hypertension   ? followed by pcp  ? Mitral insufficiency   ? PAF (paroxysmal atrial fibrillation) ([place])   ? cardiologist--- dr Ronnell Freshwater--- dx 2009  ? Tricuspid insufficiency   ? ? ?Past Surgical History:  ?Procedure Laterality Date  ? CARDIAC CATHETERIZATION  2009   [facility]@[place]  ? per pt done due to dx AFib;  told no blockage's  ? CATARACT EXTRACTION W/ INTRAOCULAR LENS   IMPLANT, BILATERAL  02/2019  ? CYSTOSCOPY WITH INJECTION N/A 10/27/2020  ? Procedure: CYSTOSCOPY WITH INJECTION OF INDOCYANINE GREEN DYE;  Surgeon: Alexis Frock, MD;  [facility];  Service: Urology;  Laterality: N/A;  ? LYMPH NODE DISSECTION Bilateral 10/27/2020  ? Procedure: LYMPH NODE DISSECTION;  Surgeon: Alexis Frock, MD;  [facility];  Service: Urology;  Laterality: Bilateral;  ? ROBOT ASSISTED LAPAROSCOPIC COMPLETE CYSTECT ILEAL CONDUIT N/A 10/27/2020  ? Procedure: XI ROBOTIC ASSISTED LAPAROSCOPIC COMPLETE CYSTECT ILEAL CONDUIT, RADICAL PROSTATECTOMY AND BILATERAL INGUINAL HERNIA REPAIR;  Surgeon: Alexis Frock, MD;  [facility];  Service: Urology;  Laterality: N/A;  6 HRS  ? TRANSURETHRAL RESECTION OF BLADDER TUMOR N/A 10/29/2019  ? Procedure: TRANSURETHRAL RESECTION OF BLADDER TUMOR  (TURBT), CYSTOSCOPY/ INTRAVESICAL INSTILLATION OF GEMCITABINE;  Surgeon: Ceasar Mons, MD;  Location: [facility];  Service: Urology;  Laterality: N/A;  ? TRANSURETHRAL RESECTION OF BLADDER TUMOR N/A 07/14/2020  ? Procedure: TRANSURETHRAL RESECTION OF BLADDER TUMOR (TURBT) WITH CYSTOSCOPY/ GEMCITABINE INSTILLATION;  Surgeon: Ceasar Mons, MD;  [facility];  Service: Urology;  Laterality: N/A;  ? ? ?MEDICATIONS: ? atorvastatin (LIPITOR) 10 MG tablet  ? cyanocobalamin (,VITAMIN B-12,) 1000 MCG/ML injection  ? diltiazem (DILACOR XR) 240 MG 24 hr capsule  ? docusate sodium (COLACE) 100 MG capsule  ? fluconazole (DIFLUCAN) 200 MG tablet  ? furosemide (LASIX) 40 MG tablet  ? lidocaine (LIDODERM) 5 %  ? methocarbamol (ROBAXIN) 500 MG tablet  ? metoprolol tartrate (LOPRESSOR) 25 MG tablet  ? pantoprazole (PROTONIX) 40 MG tablet  ? sodium bicarbonate 650 MG tablet  ? Vitamin D, Ergocalciferol, (DRISDOL) 1.25 MG (50000 UNIT) CAPS capsule  ? XARELTO 20 MG TABS tablet  ? ?No current facility-administered medications for this encounter.  ? ? gemcitabine (GEMZAR) chemo syringe for  bladder instillation 2,000 mg  ? ? ? ?Konrad Felix Ward, PA-C ?[facility] ?[phone] ? ? ? ? ? ?

## 2021-12-01 NOTE — H&P (Signed)
Chief Complaint: Patient was seen in consultation today for left percutaneous nephrostomy tube placement for OR procedure scheduled 12/07/2021 at the request of Zachary Shannon  Referring Physician(s): Zachary Shannon  Supervising Physician: Marliss Coots  Patient Status: [facility] - Out-pt  History of Present Illness: Zachary Shannon. is a 71 y.o. male with past medical history significant for long-term anticoagulation with Eliquis for A-fib, bladder cancer, prostate cancer, CHF, CAD, CKD, DVT, HLD and HLD.  Patient is scheduled for antegrade uteroscopy with biopsy and ablation of renal cysts pelvis carcinoma with Dr. Berneice Heinrich 12/07/2021.  Dr. Walden Field has requested IR place left percutaneous nephrostomy tube for scheduled or procedure.  Past Medical History:  Diagnosis Date   Anticoagulant long-term use    eliquis--- managed by cardiology   Aortic insufficiency    Cancer (HCC)    bladder   Cardiomyopathy (HCC)    dx 2009 per cardiac cath w/ ef 20-25%;   per cardiology note    CHF (congestive heart failure) (HCC)    Chronic kidney disease    Coronary artery disease cardiologist--- dr Danna Hefty  ([facility] in [place] )   per cardiologist note dated 09-16-2019 , received via fax,  pt had cardiac cath 2009 done at [facility] that showed CTO of LAD with excellent collateralization from LCx and RCA, no PCI performed, sig. cardiomyopathy ef 20-25%;  per note stated improved to 45-50% (no documentation available in note and no echo)   Dysrhythmia    Afib   Hematuria    History of deep vein thrombosis (DVT) of lower extremity 2021   HLD (hyperlipidemia)    Hypertension    followed by pcp   Mitral insufficiency    PAF (paroxysmal atrial fibrillation) [facility])    cardiologist--- dr Danna Hefty--- dx 2009   Tricuspid insufficiency     Past Surgical History:  Procedure Laterality Date   CARDIAC CATHETERIZATION  2009   @[facility]    per pt done due to dx AFib;  told no blockage's   CATARACT EXTRACTION W/ INTRAOCULAR LENS  IMPLANT, BILATERAL  02/2019   CYSTOSCOPY WITH INJECTION N/A 10/27/2020   Procedure: CYSTOSCOPY WITH INJECTION OF INDOCYANINE GREEN DYE;  Surgeon: Sebastian Ache, MD;  Location: [facility];  Service: Urology;  Laterality: N/A;   LYMPH NODE DISSECTION Bilateral 10/27/2020   Procedure: LYMPH NODE DISSECTION;  Surgeon: Sebastian Ache, MD;  Location: [facility];  Service: Urology;  Laterality: Bilateral;   ROBOT ASSISTED LAPAROSCOPIC COMPLETE CYSTECT ILEAL CONDUIT N/A 10/27/2020   Procedure: XI ROBOTIC ASSISTED LAPAROSCOPIC COMPLETE CYSTECT ILEAL CONDUIT, RADICAL PROSTATECTOMY AND BILATERAL INGUINAL HERNIA REPAIR;  Surgeon: Sebastian Ache, MD;  Location: [facility];  Service: Urology;  Laterality: N/A;  6 HRS   TRANSURETHRAL RESECTION OF BLADDER TUMOR N/A 10/29/2019   Procedure: TRANSURETHRAL RESECTION OF BLADDER TUMOR  (TURBT), CYSTOSCOPY/ INTRAVESICAL INSTILLATION OF GEMCITABINE;  Surgeon: Rene Paci, MD;  Location: [facility];  Service: Urology;  Laterality: N/A;   TRANSURETHRAL RESECTION OF BLADDER TUMOR N/A 07/14/2020   Procedure: TRANSURETHRAL RESECTION OF BLADDER TUMOR (TURBT) WITH CYSTOSCOPY/ GEMCITABINE INSTILLATION;  Surgeon: Rene Paci, MD;  Location: [facility];  Service: Urology;  Laterality: N/A;    Allergies: Patient has no known allergies.  Medications: Prior to Admission medications   Medication Sig Start Date End Date Taking? Authorizing Provider  atorvastatin (LIPITOR) 10 MG tablet Take 10 mg by mouth at bedtime.    [provider]  cyanocobalamin (,VITAMIN B-12,) 1000 MCG/ML injection Inject 1,000  mcg into the muscle every 30 (thirty) days. 04/14/21 04/21/26  [provider]  diltiazem (DILACOR XR) 240 MG 24 hr capsule Take 240 mg by mouth daily.    [provider]  docusate sodium (COLACE) 100 MG capsule Take 1 capsule (100 mg total) by  mouth daily. Patient not taking: Reported on 11/23/2021 11/26/20   Burnadette Pop, MD  fluconazole (DIFLUCAN) 200 MG tablet Take 1 tablet (200 mg total) by mouth daily. Patient not taking: Reported on 11/23/2021 12/06/20   Burnadette Pop, MD  furosemide (LASIX) 40 MG tablet Take 1 tablet (40 mg total) by mouth daily for 5 days. Patient not taking: Reported on 11/23/2021 11/26/20 12/01/20  Burnadette Pop, MD  lidocaine (LIDODERM) 5 % Place 1 patch onto the skin daily. Remove & Discard patch within 12 hours or as directed by MD Patient not taking: Reported on 11/23/2021 11/25/20   Burnadette Pop, MD  methocarbamol (ROBAXIN) 500 MG tablet Take 1 tablet (500 mg total) by mouth every 6 (six) hours as needed for muscle spasms. Patient not taking: Reported on 11/23/2021 11/25/20   Burnadette Pop, MD  metoprolol tartrate (LOPRESSOR) 25 MG tablet Take 25 mg by mouth 2 (two) times daily.    [provider]  pantoprazole (PROTONIX) 40 MG tablet Take 1 tablet (40 mg total) by mouth daily. Patient not taking: Reported on 11/23/2021 11/26/20   Burnadette Pop, MD  sodium bicarbonate 650 MG tablet Take 2 tablets (1,300 mg total) by mouth 2 (two) times daily. Patient not taking: Reported on 11/23/2021 11/25/20   Burnadette Pop, MD  Vitamin D, Ergocalciferol, (DRISDOL) 1.25 MG (50000 UNIT) CAPS capsule Take 50,000 Units by mouth once a week. 10/09/21   [provider]  XARELTO 20 MG TABS tablet Take 20 mg by mouth daily. 11/15/21   [provider]     History reviewed. No pertinent family history.  Social History   Socioeconomic History   Marital status: Divorced    Spouse name: Not on file   Number of children: Not on file   Years of education: Not on file   Highest education level: Not on file  Occupational History   Not on file  Tobacco Use   Smoking status: Former    Packs/day: 0.50    Years: 28.00    Pack years: 14.00    Types: Cigarettes    Quit date: 10/08/2008    Years since quitting:  13.1   Smokeless tobacco: Former    Quit date: 10/08/2008  Vaping Use   Vaping Use: Never used  Substance and Sexual Activity   Alcohol use: Never   Drug use: Never   Sexual activity: Not on file  Other Topics Concern   Not on file  Social History Narrative   Not on file   Social Determinants of Health   Financial Resource Strain: Not on file  Food Insecurity: Not on file  Transportation Needs: Not on file  Physical Activity: Not on file  Stress: Not on file  Social Connections: Not on file     Review of Systems: A 12 point ROS discussed and pertinent positives are indicated in the HPI above.  All other systems are negative.  Review of Systems  Constitutional:  Negative for appetite change, chills, fever and unexpected weight change.  Respiratory:  Negative for cough and shortness of breath.   Cardiovascular:  Negative for chest pain and leg swelling.  Gastrointestinal:  Negative for abdominal pain, nausea and vomiting.  Genitourinary:  Negative for hematuria.  Neurological:  Negative for dizziness and headaches.  Hematological:  Bruises/bleeds easily.   Vital Signs: BP (!) 163/94 (BP Location: Right Arm)   Pulse 97   Temp 97.6 F (36.4 C) (Oral)   Resp 15   SpO2 100%   Physical Exam Constitutional:      General: He is not in acute distress.    Appearance: Normal appearance. He is not ill-appearing.  HENT:     Head: Normocephalic and atraumatic.     Mouth/Throat:     Mouth: Mucous membranes are moist.     Pharynx: Oropharynx is clear.  Eyes:     Extraocular Movements: Extraocular movements intact.     Pupils: Pupils are equal, round, and reactive to light.  Cardiovascular:     Rate and Rhythm: Normal rate and regular rhythm.     Pulses: Normal pulses.     Heart sounds: Normal heart sounds.  Pulmonary:     Effort: Pulmonary effort is normal. No respiratory distress.     Breath sounds: Normal breath sounds.  Abdominal:     General: Bowel sounds are normal.  There is no distension.     Palpations: Abdomen is soft.     Tenderness: There is no abdominal tenderness. There is no guarding.     Comments: RLQ urostomy in place with clear, yellow urine in bag.   Musculoskeletal:     Right lower leg: No edema.     Left lower leg: No edema.  Skin:    General: Skin is warm and dry.  Neurological:     Mental Status: He is alert and oriented to person, place, and time.  Psychiatric:        Mood and Affect: Mood normal.        Behavior: Behavior normal.        Thought Content: Thought content normal.        Judgment: Judgment normal.    Imaging: No results found.  Labs:  CBC: Recent Labs    11/29/21 1324 12/02/21 1000  WBC 9.5 14.1*  HGB 14.6 14.3  HCT 44.5 43.3  PLT 262 253    COAGS: No results for input(s): INR, APTT in the last 8760 hours.  BMP: Recent Labs    11/29/21 1324 12/02/21 1000  NA 139 138  K 4.0 4.3  CL 105 106  CO2 24 22  GLUCOSE 102* 121*  BUN 17 22  CALCIUM 9.4 8.7*  CREATININE 1.49* 1.53*  GFRNONAA 50* 48*    LIVER FUNCTION TESTS: No results for input(s): BILITOT, AST, ALT, ALKPHOS, PROT, ALBUMIN in the last 8760 hours.  TUMOR MARKERS: No results for input(s): AFPTM, CEA, CA199, CHROMGRNA in the last 8760 hours.  Assessment and Plan: History of long-term anticoagulation with Eliquis for A-fib, bladder cancer, prostate cancer, CHF, CAD, CKD, DVT, HLD and HLD.  Patient is scheduled for antegrade uteroscopy with biopsy and ablation of renal cysts pelvis carcinoma with Dr. Berneice Heinrich 12/07/2021.  Dr. Walden Field has requested IR place left percutaneous nephrostomy tube for scheduled or procedure.  Pt resting on stretcher watching TV. He is A&O, calm and pleasant.  He is in no distress.  Pt is NPO per order.  Pt last Xarelto 11/28/21  Risks and benefits of left PCN placement was discussed with the patient including, but not limited to, infection, bleeding, significant bleeding causing loss or decrease in renal function  or damage to adjacent structures.   All of the patient's questions were answered, patient  is agreeable to proceed.  Consent signed and in chart.    Thank you for this interesting consult.  I greatly enjoyed meeting Zachary Shannon. and look forward to participating in their care.  A copy of this report was sent to the requesting provider on this date.  Electronically Signed: Shon Hough, NP 12/02/2021, 11:27 AM   I spent a total of 20 minutes in face to face in clinical consultation, greater than 50% of which was counseling/coordinating care for left percutaneous nephrostomy tube placement.

## 2021-12-02 ENCOUNTER — Encounter (HOSPITAL_COMMUNITY): Payer: Self-pay

## 2021-12-02 ENCOUNTER — Ambulatory Visit (HOSPITAL_COMMUNITY)
Admission: RE | Admit: 2021-12-02 | Discharge: 2021-12-02 | Disposition: A | Discharge status: Home / Self Care | Payer: Medicare Other | Source: Ambulatory Visit | Attending: Urology | Admitting: Urology

## 2021-12-02 ENCOUNTER — Other Ambulatory Visit: Payer: Self-pay

## 2021-12-02 DIAGNOSIS — C652 Malignant neoplasm of left renal pelvis: Secondary | ICD-10-CM | POA: Diagnosis present

## 2021-12-02 DIAGNOSIS — E785 Hyperlipidemia, unspecified: Secondary | ICD-10-CM | POA: Diagnosis not present

## 2021-12-02 DIAGNOSIS — Z8546 Personal history of malignant neoplasm of prostate: Secondary | ICD-10-CM | POA: Diagnosis not present

## 2021-12-02 DIAGNOSIS — I509 Heart failure, unspecified: Secondary | ICD-10-CM | POA: Insufficient documentation

## 2021-12-02 DIAGNOSIS — I4819 Other persistent atrial fibrillation: Secondary | ICD-10-CM | POA: Insufficient documentation

## 2021-12-02 DIAGNOSIS — N189 Chronic kidney disease, unspecified: Secondary | ICD-10-CM | POA: Diagnosis not present

## 2021-12-02 DIAGNOSIS — I130 Hypertensive heart and chronic kidney disease with heart failure and stage 1 through stage 4 chronic kidney disease, or unspecified chronic kidney disease: Secondary | ICD-10-CM | POA: Diagnosis not present

## 2021-12-02 DIAGNOSIS — R300 Dysuria: Secondary | ICD-10-CM | POA: Diagnosis not present

## 2021-12-02 DIAGNOSIS — Z8551 Personal history of malignant neoplasm of bladder: Secondary | ICD-10-CM | POA: Diagnosis not present

## 2021-12-02 DIAGNOSIS — I2510 Atherosclerotic heart disease of native coronary artery without angina pectoris: Secondary | ICD-10-CM | POA: Insufficient documentation

## 2021-12-02 DIAGNOSIS — Z87891 Personal history of nicotine dependence: Secondary | ICD-10-CM | POA: Insufficient documentation

## 2021-12-02 DIAGNOSIS — Z7901 Long term (current) use of anticoagulants: Secondary | ICD-10-CM | POA: Diagnosis not present

## 2021-12-02 HISTORY — PX: IR NEPHROSTOMY PLACEMENT LEFT: IMG6063

## 2021-12-02 LAB — CBC WITH DIFFERENTIAL/PLATELET
Abs Immature Granulocytes: 0.11 10*3/uL — ABNORMAL HIGH (ref 0.00–0.07)
Basophils Absolute: 0 10*3/uL (ref 0.0–0.1)
Basophils Relative: 0 %
Eosinophils Absolute: 0.1 10*3/uL (ref 0.0–0.5)
Eosinophils Relative: 0 %
HCT: 43.3 % (ref 39.0–52.0)
Hemoglobin: 14.3 g/dL (ref 13.0–17.0)
Immature Granulocytes: 1 %
Lymphocytes Relative: 3 %
Lymphs Abs: 0.5 10*3/uL — ABNORMAL LOW (ref 0.7–4.0)
MCH: 33.3 pg (ref 26.0–34.0)
MCHC: 33 g/dL (ref 30.0–36.0)
MCV: 100.7 fL — ABNORMAL HIGH (ref 80.0–100.0)
Monocytes Absolute: 0.6 10*3/uL (ref 0.1–1.0)
Monocytes Relative: 4 %
Neutro Abs: 12.8 10*3/uL — ABNORMAL HIGH (ref 1.7–7.7)
Neutrophils Relative %: 92 %
Platelets: 253 10*3/uL (ref 150–400)
RBC: 4.3 MIL/uL (ref 4.22–5.81)
RDW: 14.5 % (ref 11.5–15.5)
WBC: 14.1 10*3/uL — ABNORMAL HIGH (ref 4.0–10.5)
nRBC: 0 % (ref 0.0–0.2)

## 2021-12-02 LAB — BASIC METABOLIC PANEL
Anion gap: 10 (ref 5–15)
BUN: 22 mg/dL (ref 8–23)
CO2: 22 mmol/L (ref 22–32)
Calcium: 8.7 mg/dL — ABNORMAL LOW (ref 8.9–10.3)
Chloride: 106 mmol/L (ref 98–111)
Creatinine, Ser: 1.53 mg/dL — ABNORMAL HIGH (ref 0.61–1.24)
GFR, Estimated: 48 mL/min — ABNORMAL LOW (ref >60)
Glucose, Bld: 121 mg/dL — ABNORMAL HIGH (ref 70–99)
Potassium: 4.3 mmol/L (ref 3.5–5.1)
Sodium: 138 mmol/L (ref 135–145)

## 2021-12-02 LAB — PROTIME-INR
INR: 1 (ref 0.8–1.2)
Prothrombin Time: 13.4 seconds (ref 11.4–15.2)

## 2021-12-02 MED ORDER — FENTANYL CITRATE (PF) 100 MCG/2ML IJ SOLN
INTRAMUSCULAR | Status: AC
  Filled 2021-12-02: qty 2

## 2021-12-02 MED ORDER — IOHEXOL 300 MG/ML  SOLN
50.0000 mL | Freq: Once | INTRAMUSCULAR | Status: AC | PRN
  Administered 2021-12-02: 10 mL

## 2021-12-02 MED ORDER — ONDANSETRON HCL 4 MG/2ML IJ SOLN
4.0000 mg | Freq: Once | INTRAMUSCULAR | Status: AC
  Administered 2021-12-02: 4 mg via INTRAVENOUS
  Filled 2021-12-02: qty 2

## 2021-12-02 MED ORDER — LIDOCAINE HCL 1 % IJ SOLN
INTRAMUSCULAR | Status: AC
  Filled 2021-12-02: qty 20

## 2021-12-02 MED ORDER — SODIUM CHLORIDE 0.9 % IV SOLN: INTRAVENOUS | Status: DC

## 2021-12-02 MED ORDER — TRAMADOL HCL 50 MG PO TABS: 50.0000 mg | ORAL_TABLET | Freq: Four times a day (QID) | ORAL | 0 refills | Status: DC | PRN

## 2021-12-02 MED ORDER — MIDAZOLAM HCL 2 MG/2ML IJ SOLN
INTRAMUSCULAR | Status: AC
  Filled 2021-12-02: qty 4

## 2021-12-02 MED ORDER — LIDOCAINE HCL 1 % IJ SOLN
INTRAMUSCULAR | Status: DC | PRN
  Administered 2021-12-02: 10 mL

## 2021-12-02 MED ORDER — SODIUM CHLORIDE 0.9 % IV SOLN
2.0000 g | Freq: Once | INTRAVENOUS | Status: AC
  Administered 2021-12-02: 2 g via INTRAVENOUS
  Filled 2021-12-02 (×2): qty 20

## 2021-12-02 MED ORDER — FENTANYL CITRATE (PF) 100 MCG/2ML IJ SOLN
INTRAMUSCULAR | Status: AC | PRN
  Administered 2021-12-02 (×2): 50 ug via INTRAVENOUS

## 2021-12-02 MED ORDER — ONDANSETRON 4 MG PO TBDP: 4.0000 mg | ORAL_TABLET | Freq: Three times a day (TID) | ORAL | 0 refills | Status: DC | PRN

## 2021-12-02 MED ORDER — MIDAZOLAM HCL 2 MG/2ML IJ SOLN
INTRAMUSCULAR | Status: AC | PRN
  Administered 2021-12-02 (×2): 1 mg via INTRAVENOUS

## 2021-12-02 NOTE — Discharge Instructions (Addendum)
Please call Interventional Radiology clinic [phone] with any questions or concerns. ? ? ?Percutaneous Nephrostomy, Care After ?This sheet gives you information about how to care for yourself after your procedure. Your health care provider may also give you more specific instructions. If you have problems or questions, contact your health careprovider. ?What can I expect after the procedure? ?After the procedure, it is common to have: ?Some soreness where the nephrostomy tube was inserted (tube insertion site). ?Blood-tinged drainage from the nephrostomy tube for the first 24 hours. ?Follow these instructions at home: ?Activity ? ?Do not lift anything that is heavier than 10 lb (4.5 kg), or the limit that you are told, until your health care provider says that it is safe. ?Return to your normal activities as told by your health care provider. Ask your health care provider what activities are safe for you. ?Avoid activities that may cause the nephrostomy tubing to bend. ?Do not take shower, baths, swim, or use a hot tub until your health care provider approves.  ?If you were given a sedative during the procedure, it can affect you for several hours. Do not drive or operate machinery until your health care provider says that it is safe. ?  ?Care of the tube insertion site ? ?Follow instructions from your health care provider about how to take care of your tube insertion site. Make sure you: ?Wash your hands with soap and water for at least 20 seconds before you change your dressing. If soap and water are not available, use hand sanitizer. ?Change your dressing as told by your health care provider. Be careful not to pull on the tube while removing the dressing. ?When you change the dressing, wash the skin around the tube, rinse well, and pat the skin dry. Change your dressing daily or as it becomes soiled. ?Check the tube insertion area every day for signs of infection. Check for: ?Redness, swelling, or  pain. ?Fluid or blood. ?Warmth. ?Pus or a bad smell. ?  ?Care of the nephrostomy tube and drainage bag ?Always keep the tubing, the leg bag, or the bedside drainage bags below the level of the kidney so that your urine drains freely. ?When connecting your nephrostomy tube to a drainage bag, make sure that there are no kinks in the tubing and that your urine is draining freely. You may want to use an elastic bandage to wrap any exposed tubing that goes from the nephrostomy tube to any of the connecting tubes. ?At night, you may want to connect your nephrostomy tube or the leg bag to a larger bedside drainage bag. ?Follow instructions from your health care provider about how to empty or change the drainage bag. ?Empty the drainage bag when it becomes ? full.  When volume reaches 200-300 mls. ?Replace the drainage bag and any extension tubing that is connected to your nephrostomy tube every 7 days or as told by your health care provider. Your health care provider will explain how to change the drainage bag and extension tubing. ?General instructions ?Take over-the-counter and prescription medicines only as told by your health care provider. ?Keep all follow-up visits as told by your health care provider. This is important. ?The nephrostomy tube will need to be changed every 8-12 weeks. ?Contact a health care provider if: ?You have problems with any of the valves or tubing. ?You have persistent pain or soreness in your back. ?You have redness, swelling, or pain around your tube insertion site. ?You have fluid or blood coming from  your tube insertion site. ?Your tube insertion site feels warm to the touch. ?You have pus or a bad smell coming from your tube insertion site. ?You have increased urine output or you feel burning when urinating. ?Get help right away if: ?You have pain in your abdomen during the first week. ?You have chest pain or have trouble breathing. ?You have a new appearance of blood in your urine. ?You  have a fever or chills. ?You have back pain that is not relieved by your medicine. ?You have decreased urine output. ?Your nephrostomy tube comes out. ?Summary ?After the procedure, it is common to have some soreness where the nephrostomy tube was inserted (tube insertion site). ?Follow instructions from your health care provider about how to take care of your tube insertion site, nephrostomy tube, and drainage bag. ?Keep all follow-up visits for care and for changing the tube. ?This information is not intended to replace advice given to you by your health care provider. Make sure you discuss any questions you have with your healthcare provider. ?Document Revised: 08/05/2019 Document Reviewed: 08/05/2019 ?Elsevier Patient Education ? [place]. ? ? ?Moderate Conscious Sedation, Adult, Care After ?This sheet gives you information about how to care for yourself after your procedure. Your health care provider may also give you more specific instructions. If you have problems or questions, contact your health care provider. ?What can I expect after the procedure? ?After the procedure, it is common to have: ?Sleepiness for several hours. ?Impaired judgment for several hours. ?Difficulty with balance. ?Vomiting if you eat too soon. ?Follow these instructions at home: ?For the time period you were told by your health care provider: ?Rest. ?Do not participate in activities where you could fall or become injured. ?Do not drive or use machinery. ?Do not drink alcohol. ?Do not take sleeping pills or medicines that cause drowsiness. ?Do not make important decisions or sign legal documents. ?Do not take care of children on your own.  ?  ?  ?Eating and drinking ?Follow the diet recommended by your health care provider. ?Drink enough fluid to keep your urine pale yellow. ?If you vomit: ?Drink water, juice, or soup when you can drink without vomiting. ?Make sure you have little or no nausea before eating solid foods.    ?General instructions ?Take over-the-counter and prescription medicines only as told by your health care provider. ?Have a responsible adult stay with you for the time you are told. It is important to have someone help care for you until you are awake and alert. ?Do not smoke. ?Keep all follow-up visits as told by your health care provider. This is important. ?Contact a health care provider if: ?You are still sleepy or having trouble with balance after 24 hours. ?You feel light-headed. ?You keep feeling nauseous or you keep vomiting. ?You develop a rash. ?You have a fever. ?You have redness or swelling around the IV site. ?Get help right away if: ?You have trouble breathing. ?You have new-onset confusion at home. ?Summary ?After the procedure, it is common to feel sleepy, have impaired judgment, or feel nauseous if you eat too soon. ?Rest after you get home. Know the things you should not do after the procedure. ?Follow the diet recommended by your health care provider and drink enough fluid to keep your urine pale yellow. ?Get help right away if you have trouble breathing or new-onset confusion at home. ?This information is not intended to replace advice given to you by your health care provider. Make  sure you discuss any questions you have with your health care provider. ?Document Revised: 11/07/2019 Document Reviewed: 06/05/2019 ?Elsevier Patient Education ? [place].  ?

## 2021-12-02 NOTE — Procedures (Signed)
Interventional Radiology Procedure Note ? ?Procedure: Image guided left percutaneous nephrostomy tube placement ? ?Findings: Please refer to procedural dictation for full description. Left percutaneous nephrostomy, lower pole, 10.2 Fr, to bag drainage.  Obstructed distal left ureter, no drainage into ostomy visualized. ? ?Complications: None immediate ? ?Estimated Blood Loss: < 5 mL ? ?Recommendations: ?Keep to bag drainage for now, long term plan as per Urology. ? ? ?Ruthann Cancer, MD ?Pager: [phone] ? ? ? ?

## 2021-12-04 LAB — URINE CULTURE: Culture: >=100000 — AB

## 2021-12-06 NOTE — Anesthesia Preprocedure Evaluation (Addendum)
Anesthesia Evaluation  ?Patient identified by MRN, date of birth, ID band ?Patient awake ? ? ? ?Reviewed: ?Allergy & Precautions, NPO status , Patient's Chart, lab work & pertinent test results ? ?Airway ?Mallampati: II ? ?TM Distance: >3 FB ?Neck ROM: Full ? ? ? Dental ?no notable dental hx. ?(+) Missing,  ?  ?Pulmonary ?former smoker,  ?  ?Pulmonary exam normal ?breath sounds clear to auscultation ? ? ? ? ? ? Cardiovascular ?hypertension, + CAD, +CHF and + DVT  ?Normal cardiovascular exam+ dysrhythmias Atrial Fibrillation  ?Rhythm:Regular Rate:Normal ? ?10/2020 TTE  ?1. Left ventricular ejection fraction, by estimation, is 55 to 60%. The  ?left ventricle has normal function. The left ventricle has no regional  ?wall motion abnormalities. Left ventricular diastolic function could not  ?be evaluated.  ??2. Right ventricular systolic function is mildly reduced. The right  ?ventricular size is mildly enlarged. There is moderately elevated  ?pulmonary artery systolic pressure. The estimated right ventricular  ?systolic pressure is 44.0 mmHg.  ??3. Left atrial size was severely dilated.  ??4. Right atrial size was moderately dilated.  ??5. The mitral valve is grossly normal. Trivial mitral valve  ?regurgitation. No evidence of mitral stenosis.  ??6. The aortic valve is tricuspid. There is mild calcification of the  ?aortic valve. There is mild thickening of the aortic valve. Aortic valve  ?regurgitation is not visualized. No aortic stenosis is present.  ??7. Aortic dilatation noted. There is mild dilatation of the aortic root,  ?measuring 42 mm. There is mild dilatation of the aortic root, measuring 43  ?mm.  ??8. The inferior vena cava is dilated in size with >50% respiratory  ?variability, suggesting right atrial pressure of 8 mmHg.  ?  ?Neuro/Psych ?  ? GI/Hepatic ?  ?Endo/Other  ? ? Renal/GU ?Renal InsufficiencyRenal diseaseLab Results ?     Component                Value                Date                 ?     CREATININE               1.53 (H)            12/02/2021         ?     K                        4.3                 12/02/2021           ?      Bladder dysfunction (Bladder CA) ? ? ? ?  ?Musculoskeletal ?negative musculoskeletal ROS ?(+)  ? Abdominal ?(+) + obese,   ?Peds ? Hematology ?Lab Results ?     Component                Value               Date                 ?     WBC                      14.1 (H)            12/02/2021           ?  HGB                      14.3                12/02/2021           ?     HCT                      43.3                12/02/2021           ?     MCV                      100.7 (H)           12/02/2021           ?     PLT                      253                 12/02/2021           ?   ?Anesthesia Other Findings ? ? Reproductive/Obstetrics ? ?  ? ? ? ? ? ? ? ? ? ? ? ? ? ?  ?  ? ? ? ? ? ? ? ?Anesthesia Physical ?Anesthesia Plan ? ?ASA: 3 ? ?Anesthesia Plan: General  ? ?Post-op Pain Management:   ? ?Induction: Intravenous ? ?PONV Risk Score and Plan: 3 and Treatment may vary due to age or medical condition, Ondansetron and Midazolam ? ?Airway Management Planned: Oral ETT ? ?Additional Equipment: None ? ?Intra-op Plan:  ? ?Post-operative Plan: Extubation in OR ? ?Informed Consent: I have reviewed the patients History and Physical, chart, labs and discussed the procedure including the risks, benefits and alternatives for the proposed anesthesia with the patient or authorized representative who has indicated his/her understanding and acceptance.  ? ? ? ?Dental advisory given ? ?Plan Discussed with: CRNA and Anesthesiologist ? ?Anesthesia Plan Comments:   ? ? ? ? ? ?Anesthesia Quick Evaluation ? ?

## 2021-12-07 ENCOUNTER — Observation Stay (HOSPITAL_COMMUNITY)
Admission: RE | Admit: 2021-12-07 | Discharge: 2021-12-08 | Disposition: A | Discharge status: Home / Self Care | Payer: Medicare Other | Source: Ambulatory Visit | Attending: Urology | Admitting: Urology

## 2021-12-07 ENCOUNTER — Encounter (HOSPITAL_COMMUNITY): Payer: Self-pay | Admitting: Urology

## 2021-12-07 ENCOUNTER — Ambulatory Visit (HOSPITAL_BASED_OUTPATIENT_CLINIC_OR_DEPARTMENT_OTHER): Payer: Medicare Other | Admitting: Anesthesiology

## 2021-12-07 ENCOUNTER — Ambulatory Visit (HOSPITAL_COMMUNITY): Payer: Medicare Other

## 2021-12-07 ENCOUNTER — Encounter (HOSPITAL_COMMUNITY)
Admission: RE | Disposition: A | Discharge status: Home / Self Care | Payer: Self-pay | Source: Ambulatory Visit | Attending: Urology

## 2021-12-07 ENCOUNTER — Ambulatory Visit (HOSPITAL_COMMUNITY): Payer: Medicare Other | Admitting: Physician Assistant

## 2021-12-07 DIAGNOSIS — I509 Heart failure, unspecified: Secondary | ICD-10-CM

## 2021-12-07 DIAGNOSIS — I2510 Atherosclerotic heart disease of native coronary artery without angina pectoris: Secondary | ICD-10-CM

## 2021-12-07 DIAGNOSIS — N3289 Other specified disorders of bladder: Secondary | ICD-10-CM | POA: Diagnosis present

## 2021-12-07 DIAGNOSIS — N289 Disorder of kidney and ureter, unspecified: Secondary | ICD-10-CM

## 2021-12-07 DIAGNOSIS — Z86718 Personal history of other venous thrombosis and embolism: Secondary | ICD-10-CM | POA: Insufficient documentation

## 2021-12-07 DIAGNOSIS — I130 Hypertensive heart and chronic kidney disease with heart failure and stage 1 through stage 4 chronic kidney disease, or unspecified chronic kidney disease: Secondary | ICD-10-CM | POA: Insufficient documentation

## 2021-12-07 DIAGNOSIS — N189 Chronic kidney disease, unspecified: Secondary | ICD-10-CM | POA: Insufficient documentation

## 2021-12-07 DIAGNOSIS — Z87891 Personal history of nicotine dependence: Secondary | ICD-10-CM | POA: Diagnosis not present

## 2021-12-07 DIAGNOSIS — Z8551 Personal history of malignant neoplasm of bladder: Secondary | ICD-10-CM | POA: Diagnosis not present

## 2021-12-07 DIAGNOSIS — N136 Pyonephrosis: Secondary | ICD-10-CM | POA: Diagnosis not present

## 2021-12-07 DIAGNOSIS — I110 Hypertensive heart disease with heart failure: Secondary | ICD-10-CM

## 2021-12-07 DIAGNOSIS — N1330 Unspecified hydronephrosis: Secondary | ICD-10-CM | POA: Diagnosis present

## 2021-12-07 DIAGNOSIS — N2889 Other specified disorders of kidney and ureter: Secondary | ICD-10-CM | POA: Diagnosis not present

## 2021-12-07 DIAGNOSIS — Z01818 Encounter for other preprocedural examination: Secondary | ICD-10-CM

## 2021-12-07 LAB — HEMOGLOBIN AND HEMATOCRIT, BLOOD
HCT: 41 % (ref 39.0–52.0)
Hemoglobin: 13.8 g/dL (ref 13.0–17.0)

## 2021-12-07 SURGERY — URETEROSCOPY, ANTEGRADE
Anesthesia: General | Laterality: Left

## 2021-12-07 MED ORDER — ACETAMINOPHEN 500 MG PO TABS
1000.0000 mg | ORAL_TABLET | Freq: Four times a day (QID) | ORAL | Status: DC
  Administered 2021-12-07: 1000 mg via ORAL
  Filled 2021-12-07 (×3): qty 2

## 2021-12-07 MED ORDER — PROPOFOL 10 MG/ML IV BOLUS
INTRAVENOUS | Status: DC | PRN
  Administered 2021-12-07: 160 mg via INTRAVENOUS

## 2021-12-07 MED ORDER — DILTIAZEM HCL ER COATED BEADS 240 MG PO CP24
240.0000 mg | ORAL_CAPSULE | Freq: Every day | ORAL | Status: DC
  Administered 2021-12-08: 240 mg via ORAL
  Filled 2021-12-07: qty 1

## 2021-12-07 MED ORDER — CHLORHEXIDINE GLUCONATE 0.12 % MT SOLN
15.0000 mL | Freq: Once | OROMUCOSAL | Status: AC
  Administered 2021-12-07: 15 mL via OROMUCOSAL

## 2021-12-07 MED ORDER — OXYCODONE HCL 5 MG PO TABS: 5.0000 mg | ORAL_TABLET | ORAL | Status: DC | PRN

## 2021-12-07 MED ORDER — FENTANYL CITRATE (PF) 100 MCG/2ML IJ SOLN
INTRAMUSCULAR | Status: DC | PRN
  Administered 2021-12-07: 100 ug via INTRAVENOUS

## 2021-12-07 MED ORDER — DEXAMETHASONE SODIUM PHOSPHATE 10 MG/ML IJ SOLN
INTRAMUSCULAR | Status: DC | PRN
Start: 2021-12-07 — End: 2021-12-07
  Administered 2021-12-07: 10 mg via INTRAVENOUS

## 2021-12-07 MED ORDER — OXYCODONE HCL 5 MG PO TABS
5.0000 mg | ORAL_TABLET | Freq: Once | ORAL | Status: AC | PRN
  Administered 2021-12-07: 5 mg via ORAL

## 2021-12-07 MED ORDER — EPHEDRINE SULFATE-NACL 50-0.9 MG/10ML-% IV SOSY
PREFILLED_SYRINGE | INTRAVENOUS | Status: DC | PRN
  Administered 2021-12-07: 10 mg via INTRAVENOUS

## 2021-12-07 MED ORDER — ONDANSETRON HCL 4 MG/2ML IJ SOLN
INTRAMUSCULAR | Status: DC | PRN
  Administered 2021-12-07: 4 mg via INTRAVENOUS

## 2021-12-07 MED ORDER — LIDOCAINE 2% (20 MG/ML) 5 ML SYRINGE
INTRAMUSCULAR | Status: DC | PRN
Start: 2021-12-07 — End: 2021-12-07
  Administered 2021-12-07: 100 mg via INTRAVENOUS

## 2021-12-07 MED ORDER — FENTANYL CITRATE (PF) 100 MCG/2ML IJ SOLN
INTRAMUSCULAR | Status: AC
  Filled 2021-12-07: qty 2

## 2021-12-07 MED ORDER — ATORVASTATIN CALCIUM 10 MG PO TABS
10.0000 mg | ORAL_TABLET | Freq: Every day | ORAL | Status: DC
  Administered 2021-12-07: 10 mg via ORAL
  Filled 2021-12-07: qty 1

## 2021-12-07 MED ORDER — ROCURONIUM BROMIDE 10 MG/ML (PF) SYRINGE
PREFILLED_SYRINGE | INTRAVENOUS | Status: DC | PRN
  Administered 2021-12-07: 80 mg via INTRAVENOUS

## 2021-12-07 MED ORDER — DEXMEDETOMIDINE (PRECEDEX) IN NS 20 MCG/5ML (4 MCG/ML) IV SYRINGE
PREFILLED_SYRINGE | INTRAVENOUS | Status: DC | PRN
Start: 2021-12-07 — End: 2021-12-07
  Administered 2021-12-07: 12 ug via INTRAVENOUS

## 2021-12-07 MED ORDER — ONDANSETRON HCL 4 MG/2ML IJ SOLN
INTRAMUSCULAR | Status: AC
  Filled 2021-12-07: qty 2

## 2021-12-07 MED ORDER — ROCURONIUM BROMIDE 10 MG/ML (PF) SYRINGE
PREFILLED_SYRINGE | INTRAVENOUS | Status: AC
  Filled 2021-12-07: qty 10

## 2021-12-07 MED ORDER — METOPROLOL TARTRATE 25 MG PO TABS
25.0000 mg | ORAL_TABLET | Freq: Two times a day (BID) | ORAL | Status: DC
  Administered 2021-12-07 – 2021-12-08 (×2): 25 mg via ORAL
  Filled 2021-12-07 (×2): qty 1

## 2021-12-07 MED ORDER — ORAL CARE MOUTH RINSE: 15.0000 mL | Freq: Once | OROMUCOSAL | Status: AC

## 2021-12-07 MED ORDER — PROPOFOL 10 MG/ML IV BOLUS
INTRAVENOUS | Status: AC
  Filled 2021-12-07: qty 20

## 2021-12-07 MED ORDER — SODIUM CHLORIDE 0.9 % IR SOLN
Status: DC | PRN
  Administered 2021-12-07: 6000 mL

## 2021-12-07 MED ORDER — HYDROMORPHONE HCL 1 MG/ML IJ SOLN: 0.5000 mg | INTRAMUSCULAR | Status: DC | PRN

## 2021-12-07 MED ORDER — SENNOSIDES-DOCUSATE SODIUM 8.6-50 MG PO TABS
1.0000 | ORAL_TABLET | Freq: Two times a day (BID) | ORAL | Status: DC
  Administered 2021-12-07 – 2021-12-08 (×2): 1 via ORAL
  Filled 2021-12-07 (×2): qty 1

## 2021-12-07 MED ORDER — OXYCODONE HCL 5 MG PO TABS
ORAL_TABLET | ORAL | Status: AC
  Filled 2021-12-07: qty 1

## 2021-12-07 MED ORDER — PHENYLEPHRINE 80 MCG/ML (10ML) SYRINGE FOR IV PUSH (FOR BLOOD PRESSURE SUPPORT)
PREFILLED_SYRINGE | INTRAVENOUS | Status: DC | PRN
  Administered 2021-12-07 (×2): 80 ug via INTRAVENOUS
  Administered 2021-12-07: 160 ug via INTRAVENOUS

## 2021-12-07 MED ORDER — DILTIAZEM HCL ER 240 MG PO CP24: 240.0000 mg | ORAL_CAPSULE | Freq: Every day | ORAL | Status: DC

## 2021-12-07 MED ORDER — IOHEXOL 300 MG/ML  SOLN
INTRAMUSCULAR | Status: DC | PRN
  Administered 2021-12-07: 17 mg

## 2021-12-07 MED ORDER — OXYCODONE HCL 5 MG/5ML PO SOLN: 5.0000 mg | Freq: Once | ORAL | Status: AC | PRN

## 2021-12-07 MED ORDER — DEXAMETHASONE SODIUM PHOSPHATE 10 MG/ML IJ SOLN
INTRAMUSCULAR | Status: AC
  Filled 2021-12-07: qty 1

## 2021-12-07 MED ORDER — PHENYLEPHRINE 80 MCG/ML (10ML) SYRINGE FOR IV PUSH (FOR BLOOD PRESSURE SUPPORT)
PREFILLED_SYRINGE | INTRAVENOUS | Status: AC
  Filled 2021-12-07: qty 10

## 2021-12-07 MED ORDER — ACETAMINOPHEN 10 MG/ML IV SOLN: 1000.0000 mg | Freq: Once | INTRAVENOUS | Status: DC | PRN

## 2021-12-07 MED ORDER — LACTATED RINGERS IV SOLN: INTRAVENOUS | Status: DC

## 2021-12-07 MED ORDER — GENTAMICIN SULFATE 40 MG/ML IJ SOLN
5.0000 mg/kg | INTRAVENOUS | Status: AC
  Administered 2021-12-07: 440 mg via INTRAVENOUS
  Filled 2021-12-07: qty 11

## 2021-12-07 MED ORDER — ONDANSETRON HCL 4 MG/2ML IJ SOLN: 4.0000 mg | Freq: Once | INTRAMUSCULAR | Status: DC | PRN

## 2021-12-07 MED ORDER — LIDOCAINE HCL (PF) 2 % IJ SOLN
INTRAMUSCULAR | Status: AC
  Filled 2021-12-07: qty 5

## 2021-12-07 MED ORDER — SUGAMMADEX SODIUM 200 MG/2ML IV SOLN
INTRAVENOUS | Status: DC | PRN
  Administered 2021-12-07: 200 mg via INTRAVENOUS

## 2021-12-07 MED ORDER — SODIUM CHLORIDE 0.9 % IV SOLN: INTRAVENOUS | Status: DC

## 2021-12-07 MED ORDER — HYDROMORPHONE HCL 1 MG/ML IJ SOLN: 0.2500 mg | INTRAMUSCULAR | Status: DC | PRN

## 2021-12-07 SURGICAL SUPPLY — 74 items
BAG COUNTER SPONGE SURGICOUNT (BAG) IMPLANT
BAG URINE DRAIN 2000ML AR STRL (UROLOGICAL SUPPLIES) IMPLANT
BAG URO CATCHER STRL LF (MISCELLANEOUS) IMPLANT
BASKET LASER NITINOL 1.9FR (BASKET) IMPLANT
BASKET ZERO TIP NITINOL 2.4FR (BASKET) IMPLANT
BENZOIN TINCTURE PRP APPL 2/3 (GAUZE/BANDAGES/DRESSINGS) ×2 IMPLANT
BLADE SURG 15 STRL LF DISP TIS (BLADE) ×1 IMPLANT
BLADE SURG 15 STRL SS (BLADE)
CATH FOLEY 2W COUNCIL 20FR 5CC (CATHETERS) IMPLANT
CATH FOLEY 2WAY SLVR  5CC 16FR (CATHETERS)
CATH FOLEY 2WAY SLVR 5CC 16FR (CATHETERS) ×1 IMPLANT
CATH MULTI PURPOSE 16FR DRAIN (CATHETERS) IMPLANT
CATH ROBINSON RED A/P 20FR (CATHETERS) IMPLANT
CATH ULTRATHANE 14FR (CATHETERS) IMPLANT
CATH URET FLEX-TIP 2 LUMEN 10F (CATHETERS) ×1 IMPLANT
CATH URETL OPEN END 6FR 70 (CATHETERS) IMPLANT
CATH UROLOGY TORQUE 40 (MISCELLANEOUS) IMPLANT
CATH X-FORCE N30 NEPHROSTOMY (TUBING) IMPLANT
CHLORAPREP W/TINT 26 (MISCELLANEOUS) ×3 IMPLANT
DRAPE C-ARM 42X120 X-RAY (DRAPES) ×2 IMPLANT
DRAPE LINGEMAN PERC (DRAPES) ×2 IMPLANT
DRAPE SHEET LG 3/4 BI-LAMINATE (DRAPES) IMPLANT
DRAPE SURG IRRIG POUCH 19X23 (DRAPES) ×2 IMPLANT
DRSG PAD ABDOMINAL 8X10 ST (GAUZE/BANDAGES/DRESSINGS) ×2 IMPLANT
DRSG TEGADERM 4X4.75 (GAUZE/BANDAGES/DRESSINGS) ×1 IMPLANT
DRSG TEGADERM 8X12 (GAUZE/BANDAGES/DRESSINGS) ×2 IMPLANT
FORCEPS BIOP 2.4F 115CM BACKLD (INSTRUMENTS) ×1 IMPLANT
GAUZE SPONGE 4X4 12PLY STRL (GAUZE/BANDAGES/DRESSINGS) ×1 IMPLANT
GLOVE SURG LX 7.5 STRW (GLOVE) ×1
GLOVE SURG LX STRL 7.5 STRW (GLOVE) ×1 IMPLANT
GUIDEWIRE AMPLAZ .035X145 (WIRE) ×2 IMPLANT
GUIDEWIRE ANG ZIPWIRE 038X150 (WIRE) ×2 IMPLANT
GUIDEWIRE STR DUAL SENSOR (WIRE) ×1 IMPLANT
IV SET EXTENSION CATH 6 NF (IV SETS) IMPLANT
KIT BASIN OR (CUSTOM PROCEDURE TRAY) ×2 IMPLANT
KIT PROBE 340X3.4XDISP GRN (MISCELLANEOUS) IMPLANT
KIT PROBE TRILOGY 3.4X340 (MISCELLANEOUS)
KIT PROBE TRILOGY 3.9X350 (MISCELLANEOUS) IMPLANT
KIT TURNOVER KIT A (KITS) IMPLANT
LASER FIB FLEXIVA PULSE ID 365 (Laser) IMPLANT
LASER FIB FLEXIVA PULSE ID 550 (Laser) IMPLANT
LASER FIB FLEXIVA PULSE ID 910 (Laser) IMPLANT
MANIFOLD NEPTUNE II (INSTRUMENTS) ×2 IMPLANT
MAT GRAY ABSORB FLUID 28X50 (MISCELLANEOUS) ×1 IMPLANT
NDL TROCAR 18X15 ECHO (NEEDLE) IMPLANT
NDL TROCAR 18X20 (NEEDLE) IMPLANT
NEEDLE TROCAR 18X15 ECHO (NEEDLE) IMPLANT
NEEDLE TROCAR 18X20 (NEEDLE) IMPLANT
NS IRRIG 1000ML POUR BTL (IV SOLUTION) ×2 IMPLANT
PACK CYSTO (CUSTOM PROCEDURE TRAY) ×1 IMPLANT
PAD TELFA 2X3 NADH STRL (GAUZE/BANDAGES/DRESSINGS) ×1 IMPLANT
SHEATH PEELAWAY SET 9 (SHEATH) ×1 IMPLANT
SHEATH URETERAL 12FRX35CM (MISCELLANEOUS) ×1 IMPLANT
SPONGE GAUZE 2X2 8PLY STRL LF (GAUZE/BANDAGES/DRESSINGS) ×1 IMPLANT
SPONGE T-LAP 4X18 ~~LOC~~+RFID (SPONGE) ×1 IMPLANT
SURGIFLO W/THROMBIN 8M KIT (HEMOSTASIS) IMPLANT
SUT SILK 2 0 30  PSL (SUTURE)
SUT SILK 2 0 30 PSL (SUTURE) ×1 IMPLANT
SUT SILK 3 0 SH 30 (SUTURE) ×1 IMPLANT
SUT VIC AB 2-0 CT1 27 (SUTURE)
SUT VIC AB 2-0 CT1 TAPERPNT 27 (SUTURE) IMPLANT
SYR 10ML LL (SYRINGE) ×1 IMPLANT
SYR 20ML LL LF (SYRINGE) ×4 IMPLANT
SYR 50ML LL SCALE MARK (SYRINGE) ×1 IMPLANT
TOWEL OR 17X26 10 PK STRL BLUE (TOWEL DISPOSABLE) ×2 IMPLANT
TRACTIP FLEXIVA PULS ID 200XHI (Laser) IMPLANT
TRACTIP FLEXIVA PULSE ID 200 (Laser)
TRAY FOLEY MTR SLVR 16FR STAT (SET/KITS/TRAYS/PACK) ×1 IMPLANT
TUBE CONNECTING VINYL 14FR 30C (TUBING) IMPLANT
TUBING CONNECTING 10 (TUBING) ×3 IMPLANT
TUBING STONE CATCHER TRILOGY (MISCELLANEOUS) IMPLANT
TUBING UROLOGY SET (TUBING) ×1 IMPLANT
WATER STERILE IRR 1000ML POUR (IV SOLUTION) ×1 IMPLANT
WATER STERILE IRR 3000ML UROMA (IV SOLUTION) ×1 IMPLANT

## 2021-12-07 NOTE — Anesthesia Postprocedure Evaluation (Signed)
Anesthesia Post Note ? ?Patient: Zachary Shannon. ? ?Procedure(s) Performed: ANTEGRADE NEPHROSTOGRAM; URETEROSCOPY WITH BIOPSY;PLACEMENT OF URETERAL STENT (Left) ? ?  ? ?Patient location during evaluation: PACU ?Anesthesia Type: General ?Level of consciousness: awake and alert ?Pain management: pain level controlled ?Vital Signs Assessment: post-procedure vital signs reviewed and stable ?Respiratory status: spontaneous breathing, nonlabored ventilation, respiratory function stable and patient connected to nasal cannula oxygen ?Cardiovascular status: blood pressure returned to baseline and stable ?Postop Assessment: no apparent nausea or vomiting ?Anesthetic complications: no ? ? ?No notable events documented. ? ?Last Vitals:  ?Vitals:  ? 12/07/21 1415 12/07/21 1600  ?BP: 96/71 105/81  ?Pulse: (!) 59 (!) 59  ?Resp: (!) 28 19  ?Temp:  (!) 36.3 ?C  ?SpO2: 92% 96%  ?  ?Last Pain:  ?Vitals:  ? 12/07/21 1600  ?TempSrc:   ?PainSc: 0-No pain  ? ? ?  ?  ?  ?  ?  ?  ? ?Barnet [place] ? ? ? ? ?

## 2021-12-07 NOTE — Transfer of Care (Signed)
Immediate Anesthesia Transfer of Care Note ? ?Patient: Ankur Snowdon. ? ?Procedure(s) Performed: ANTEGRADE NEPHROSTOGRAM; URETEROSCOPY WITH BIOPSY;PLACEMENT OF URETERAL STENT (Left) ? ?Patient Location: PACU ? ?Anesthesia Type:General ? ?Level of Consciousness: drowsy and patient cooperative ? ?Airway & Oxygen Therapy: Patient Spontanous Breathing and Patient connected to face mask oxygen ? ?Post-op Assessment: Report given to RN and Post -op Vital signs reviewed and stable ? ?Post vital signs: Reviewed and stable ? ?Last Vitals:  ?Vitals Value Taken Time  ?BP 108/76 12/07/21 1334  ?Temp    ?Pulse 74 12/07/21 1338  ?Resp 24 12/07/21 1338  ?SpO2 100 % 12/07/21 1338  ?Vitals shown include unvalidated device data. ? ?Last Pain:  ?Vitals:  ? 12/07/21 1031  ?TempSrc:   ?PainSc: 0-No pain  ?   ? ?  ? ?Complications: No notable events documented. ?

## 2021-12-07 NOTE — Brief Op Note (Signed)
12/07/2021 ? ?1:20 PM ? ?PATIENT:  Zachary Shannon.  71 y.o. male ? 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 ?PROCEDURE:  Procedure(s) with comments: ?ANTEGRADE NEPHROSTOGRAM; URETEROSCOPY WITH BIOPSY;PLACEMENT OF URETERAL STENT (Left) - 2 HRS ? ?SURGEON:  Surgeon(s) and Role: ?   Alexis Frock, MD - Primary ? ?PHYSICIAN ASSISTANT:  ? ?ASSISTANTS: none  ? ?ANESTHESIA:   general ? ?EBL:  5 mL  ? ?BLOOD ADMINISTERED:none ? ?DRAINS:  urostomy to gravity; Lt nephroureteral stent (capped)   ? ?LOCAL MEDICATIONS USED:  NONE ? ?SPECIMEN:  Source of Specimen:  left renal pelvis ? ?DISPOSITION OF SPECIMEN:  PATHOLOGY ? ?COUNTS:  YES ? ?TOURNIQUET:  * No tourniquets in log * ? ?DICTATION: .Other Dictation: Dictation Number [id number] ? ?PLAN OF CARE: Admit for overnight observation ? ?PATIENT DISPOSITION:  PACU - hemodynamically stable. ?  ?Delay start of Pharmacological VTE agent (>24hrs) due to surgical blood loss or risk of bleeding: yes ? ?

## 2021-12-07 NOTE — Anesthesia Procedure Notes (Signed)
Procedure Name: Intubation ?Date/Time: 12/07/2021 12:41 PM ?Performed by: Gean Maidens, CRNA ?Pre-anesthesia Checklist: Patient identified, Emergency Drugs available, Suction available, Patient being monitored and Timeout performed ?Patient Re-evaluated:Patient Re-evaluated prior to induction ?Oxygen Delivery Method: Circle system utilized ?Preoxygenation: Pre-oxygenation with 100% oxygen ?Induction Type: IV induction ?Ventilation: Mask ventilation without difficulty ?Laryngoscope Size: Mac and 4 ?Grade View: Grade I ?Tube type: Oral ?Tube size: 7.5 mm ?Number of attempts: 1 ?Airway Equipment and Method: Stylet ?Placement Confirmation: ETT inserted through vocal cords under direct vision, positive ETCO2 and breath sounds checked- equal and bilateral ?Secured at: 23 cm ?Tube secured with: Tape ?Dental Injury: Teeth and Oropharynx as per pre-operative assessment  ? ? ? ? ?

## 2021-12-07 NOTE — Discharge Instructions (Addendum)
1 - You may have bloody urine on / off with stent in place. This is normal.  2 - Call MD or go to ER for fever >102, severe pain / nausea / vomiting not relieved by medications, or acute change in medical status  

## 2021-12-07 NOTE — H&P (Signed)
Zachary Shannon. is an 71 y.o. male.   ? ?Chief Complaint: Pre-OP LEFT Angegrade Ureteroscopy  ? ?HPI:  ? 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  ? ?Post-OP Surveillance:  ?04/2021 - CMP, CXR, CT, Urethroscopy - no recurrence, Cr 0.9,  ?10/2021 - CMP, CXR CT, Urethroscopy- NEW LEFT hydro and ?filling defecnt in renal pelvis + urerter, Cr 0.9, PSA <.01  ? ?2 -  Chronic Renal Insufficiency - Cr 2-3 following cystectomy, now improved.  ? ?3 - Rule Out LEFT Renal Pelvis Cancer - new left renal pelvis and ureteral filling defectson + mod hydro on surveillance CT 10/2021. 1 artery (early branching) / 1 vein left renovascular anatomy. Some <2cm shoddy peri-aortic nodes.  ? ?PMH sig for AFib/DVT/ELiquus (follows [place] [facility] with [facility], has clearnce to stop eliqus 48 hours prior surgery), obesity, hip OA (possible replacement pending). He is retired from [employer], then state [employer] in [place]. 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 His daughter Zachary Shannon is very involved and his POA. His PCP is Chesley Noon DO with [facility] in [place].  ? ?Today "[name]" is seen to proceed with LEFT antegrade ureteroscopy to r/o tumor recurrence. No interval fevers. Left neph tueb placed several days ago. Cr 1.5 most recently.  ? ? ?Past Medical History:  ?Diagnosis Date  ? Anticoagulant long-term use   ? eliquis--- managed by cardiology  ? Aortic insufficiency   ? Cancer [facility])   ? bladder  ? Cardiomyopathy ([place])   ? dx 2009 per cardiac cath w/ ef 20-25%;   per cardiology note   ? CHF (congestive heart failure) ([place])   ? Chronic kidney disease   ? Coronary artery disease cardiologist--- dr Ronnell Freshwater  ([facility] in [place] )  ? per cardiologist note dated 09-16-2019 , received via fax,  pt  had cardiac cath 2009 done at [facility] that showed CTO of LAD with excellent collateralization from LCx and RCA, no PCI performed, sig. cardiomyopathy ef 20-25%;  per note stated improved to 45-50% (no documentation available in note and no echo)  ? Dysrhythmia   ? Afib  ? Hematuria   ? History of deep vein thrombosis (DVT) of lower extremity 2021  ? HLD (hyperlipidemia)   ? Hypertension   ? followed by pcp  ? Mitral insufficiency   ? PAF (paroxysmal atrial fibrillation) ([place])   ? cardiologist--- dr Ronnell Freshwater--- dx 2009  ? Tricuspid insufficiency   ? ? ?Past Surgical History:  ?Procedure Laterality Date  ? CARDIAC CATHETERIZATION  2009   [facility]@[place]  ? per pt done due to dx AFib;  told no blockage's  ? CATARACT EXTRACTION W/ INTRAOCULAR LENS  IMPLANT, BILATERAL  02/2019  ? CYSTOSCOPY WITH INJECTION N/A 10/27/2020  ? Procedure: CYSTOSCOPY WITH INJECTION OF INDOCYANINE GREEN DYE;  Surgeon: Alexis Frock, MD;  Location: [facility];  Service: Urology;  Laterality: N/A;  ? IR NEPHROSTOMY PLACEMENT LEFT  12/02/2021  ? LYMPH NODE DISSECTION Bilateral 10/27/2020  ? Procedure: LYMPH NODE DISSECTION;  Surgeon: Alexis Frock, MD;  Location: [facility];  Service: Urology;  Laterality: Bilateral;  ? ROBOT ASSISTED LAPAROSCOPIC COMPLETE CYSTECT ILEAL CONDUIT N/A 10/27/2020  ? Procedure: XI ROBOTIC ASSISTED LAPAROSCOPIC COMPLETE CYSTECT ILEAL CONDUIT, RADICAL PROSTATECTOMY AND BILATERAL INGUINAL HERNIA REPAIR;  Surgeon: Alexis Frock, MD;  Location: [facility];  Service: Urology;  Laterality: N/A;  6 HRS  ? TRANSURETHRAL RESECTION OF BLADDER TUMOR N/A 10/29/2019  ? Procedure: TRANSURETHRAL RESECTION OF BLADDER TUMOR  (TURBT), CYSTOSCOPY/ INTRAVESICAL INSTILLATION OF GEMCITABINE;  Surgeon: Ceasar Mons, MD;  Location: [facility];  Service: Urology;  Laterality: N/A;  ? TRANSURETHRAL RESECTION OF BLADDER TUMOR N/A 07/14/2020  ? Procedure: TRANSURETHRAL RESECTION OF BLADDER TUMOR (TURBT)  WITH CYSTOSCOPY/ GEMCITABINE INSTILLATION;  Surgeon: Ceasar Mons, MD;  Location: [facility];  Service: Urology;  Laterality: N/A;  ? ? ?No family history on file. ?Social History:  reports that he quit smoking about 13 years ago. His smoking use included cigarettes. He has a 14.00 pack-year smoking history. He quit smokeless tobacco use about 13 years ago. He reports that he does not drink alcohol and does not use drugs. ? ?Allergies: No Known Allergies ? ?No medications prior to admission.  ? ? ?No results found for this or any previous visit (from the past 48 hour(s)). ?No results found. ? ?Review of Systems  ?Constitutional:  Negative for chills and fever.  ?All other systems reviewed and are negative. ? ?There were no vitals taken for this visit. ?Physical Exam ?Vitals reviewed.  ?Eyes:  ?   Pupils: Pupils are equal, round, and reactive to light.  ?Cardiovascular:  ?   Rate and Rhythm: Normal rate.  ?Abdominal:  ?   General: Abdomen is flat.  ?   Comments: RLQ urostomy pink and patent of non-foul urine.   ?Genitourinary: ?   Comments: Left neph tube in place with non-foul urine.  ?Musculoskeletal:  ?   Cervical back: Normal range of motion.  ?Skin: ?   General: Skin is warm.  ?Neurological:  ?   General: No focal deficit present.  ?   Mental Status: He is alert.  ?Psychiatric:     ?   Mood and Affect: Mood normal.  ?  ? ?Assessment/Plan ? ?Proceed as planned with LEFT antegrade ureteroscopy with goal of ruling out renal pelvis / ureteral tumor or significant stricture. Risks, benefits, alternatives, expected peri-op course discussed previously and reiterated today.  ? ?Alexis Frock, MD ?12/07/2021, 8:21 AM ? ? ? ?

## 2021-12-08 ENCOUNTER — Other Ambulatory Visit: Payer: Self-pay

## 2021-12-08 ENCOUNTER — Other Ambulatory Visit (HOSPITAL_COMMUNITY): Payer: Self-pay

## 2021-12-08 DIAGNOSIS — N136 Pyonephrosis: Secondary | ICD-10-CM | POA: Diagnosis not present

## 2021-12-08 LAB — BASIC METABOLIC PANEL
Anion gap: 8 (ref 5–15)
BUN: 26 mg/dL — ABNORMAL HIGH (ref 8–23)
CO2: 23 mmol/L (ref 22–32)
Calcium: 8.9 mg/dL (ref 8.9–10.3)
Chloride: 108 mmol/L (ref 98–111)
Creatinine, Ser: 2.08 mg/dL — ABNORMAL HIGH (ref 0.61–1.24)
GFR, Estimated: 33 mL/min — ABNORMAL LOW (ref >60)
Glucose, Bld: 147 mg/dL — ABNORMAL HIGH (ref 70–99)
Potassium: 4.9 mmol/L (ref 3.5–5.1)
Sodium: 139 mmol/L (ref 135–145)

## 2021-12-08 LAB — HEMOGLOBIN AND HEMATOCRIT, BLOOD
HCT: 41.6 % (ref 39.0–52.0)
Hemoglobin: 13.7 g/dL (ref 13.0–17.0)

## 2021-12-08 LAB — SURGICAL PATHOLOGY

## 2021-12-08 MED ORDER — TRAMADOL HCL 50 MG PO TABS
50.0000 mg | ORAL_TABLET | Freq: Four times a day (QID) | ORAL | 0 refills | Status: AC | PRN
  Filled 2021-12-08: qty 10, 5d supply, fill #0

## 2021-12-08 NOTE — Progress Notes (Signed)
Pt to be discharged to home this afternoon. Pt's discharge instructions including all Discharge Mediations and schedules for these Medications reviewed with the patient. Understanding verbalized. Discharge AVS with the Patient at time of discharge.

## 2021-12-08 NOTE — Discharge Summary (Signed)
Physician Discharge Summary  Patient ID: Zachary Shannon. MRN: [id number] DOB/AGE: [birth year] 71 y.o.  Admit date: 12/07/2021 Discharge date: 12/08/2021  Admission Diagnoses: Rule Out Left Renal Pelvis / Ureteral Cancer  Discharge Diagnoses:  Principal Problem:   Hydronephrosis   Discharged Condition: good  Hospital Course: Pt underwent left antegrade ureteroscopy / biopsy on 12/17/21, the day of admission, without acute complication. He was admitted to the 4th floor Urology service overnight for observation. By the afternoon of POD 1 he is ambulatory, pain controlled on PO meds, maintaining OP nutrition, and felt to be adequate for discharge. Path pending. Cr 2.0 (stable), Hgb 13.7.   Consults: None  Significant Diagnostic Studies: labs: as per above  Treatments: surgery: as per above  Discharge Exam: Blood pressure 115/82, pulse 88, temperature 97.9 F (36.6 C), temperature source Oral, resp. rate 18, height 6' (1.829 m), weight 104.3 kg, SpO2 100 %.  NAD, daughter at bedside, both very pleasant Non-labored breathing on RA RRR Stable moderate obesity with RLW Urosotmy pink and patent of copious non-foul urine. Left flank dressing in place over capped nephroureteral stent. C/d/I. No eccymoses / hematomas No c/c/e  Disposition: HOME There are no questions and answers to display.           Follow-up Information     Alexis Frock, MD Follow up on 12/26/2021.   Specialty: Urology Why: at 3:30 for MD visit. Contact information: [place] [address] [phone]                 Signed: Alexis Frock 12/08/2021, 1:12 PM

## 2021-12-08 NOTE — Op Note (Signed)
NAME: Zachary Shannon, Zachary Shannon MEDICAL RECORD NO: [id number] ACCOUNT NO: [id number] DATE OF BIRTH: 06/08/1951 FACILITY: [facility] LOCATION: [facility] PHYSICIAN: Alexis Frock, MD  Operative Report   DATE OF PROCEDURE: 12/07/2021  PREOPERATIVE DIAGNOSIS:  Rule out left renal pelvis and ureteral cancer, history of bladder cancer.  PROCEDURES:  1.  Left antegrade nephrostogram interpretation. 2.  Left ureteroscopy with biopsy. 3.  Placement of left nephroureteral stent.  ESTIMATED BLOOD LOSS:  Nil.  COMPLICATIONS:  None.  SPECIMEN:  Left renal pelvis inflammation to permanent pathology.  FINDINGS:  1.  Unremarkable left antegrade nephrostogram.  No hydronephrosis or filling defects. 2.  Unremarkable left renal pelvis, calices and entire length of left ureter.  No evidence of obvious papillary urothelial neoplasm. 3.  Likely post-nephrostomy inflammation and left renal pelvis with the mucosal edema.  This was biopsied. 4.  Successful placement of left nephroureteral stent, proximal and externalized distal within the proximal end of the ileal conduit and this was capped.  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 He has done very well functionally postoperatively. He was found on routine cancer surveillance to have a  questionable filling defect within his left renal pelvis and ureter and it was highly concerning for possible proximal recurrence of his urothelial neoplasm.  Observational management including continued surveillance versus more aggressive therapy with  ureteroscopy to rule out neoplasm.  Given his urinary diversion, this nearly would require antegrade approach.  He wished to proceed.  He had nephrostomy tube placed last week in an uncomplicated fashion.  He presents for left antegrade ureteroscopy  today with goal of ruling out urothelial neoplasm.  Informed consent was obtained and placed in  medical record.  DESCRIPTION OF PROCEDURE:  The patient, Zachary Shannon being identified, procedure being left antegrade ureteroscopy with biopsy was confirmed.  Procedure timeout was performed.  Intravenous antibiotics were administered.  General  endotracheal  anesthesia introduced.  The right lower quadrant urostomy connected to gravity drainage.  He was positioned into a prone position, in plane prone view, chest rolls, axillary rolls, padding of his knees and ankles.  Sequential compression devices.  His in  situ nephrostomy tube was capped and prepped into a sterile field in his left flank using chlorhexidine gluconate and a percutaneous drape was applied.  Initial antegrade nephrostogram was performed.  Left antegrade nephrostogram revealed excellent placement of nephrostomy tube within the mid pole of the kidney.  There were no obvious filling defects or narrowing noted.  There was free flow of contrast from the kidney all the way through the ureter,  all the way to the ileal conduit. This was documented and fluoroscopic images showed no evidence of filling defects or narrowing whatsoever.  This was quite favorable.  A ZIPwire was advanced to the level of the renal pelvis.  Nephrostomy tube was then  exchanged for a KMP type catheter, which was then used to navigate the ZIPwire all the way to the level of the proximal conduit and it was then exchanged for a sensor wire, over which a peel-away sheath was placed in antegrade fashion over the proximal  ureter.  A ZIPwire was once again advanced in a similar location over the conduit acting as a safety wire.  Over the sensor working wire, a short ureteral access sheath was carefully placed in antegrade fashion to the level of proximal ureter using  continuous fluoroscopic guidance and flexible digital ureteroscopy was performed in the  entire length of the left ureter.  Panendoscopic examination of the left ureter all the way to the proximal end of the  conduit revealed no evidence of papillary  neoplasm whatsoever or stricturing and this was quite favorable.  The access sheath was very carefully and sequentially pulled back to allow visualization of the entire renal pelvis.  This was performed including all calices x 3.  Within the renal  pelvis, there was some edematous inflammation, likely consistent with tube reaction.  This did not have the typical appearance of papillary neoplasm. As the goal today was to maximally rule out recurrence, the BIGopsy apparatus was backloaded into the  ureteroscope and representative biopsies were taken of this area of the renal pelvis edema, and it was set aside labeled as left renal pelvis inflammation.  There is no evidence of perforation.  Next, the access sheath was removed and the [facility]  catheter was advanced over the remaining safety wire so that its distal end remained in the proximal conduit.  This was capped at the externalized nephroureteral stent.  It was sewn in place using 3-0 silk x 2.  Percutaneous dressing was applied.   Procedure was terminated.  The patient tolerated the procedure well, no immediate perioperative complications.  The patient was taken to postanesthesia care unit in stable condition.  Plan for observation admission, likely discharge him tomorrow pending his clinical status.   MUK D: 12/07/2021 1:26:46 pm T: 12/08/2021 1:54:00 am  JOB: [id number]/ [id number]

## 2021-12-17 ENCOUNTER — Other Ambulatory Visit (HOSPITAL_COMMUNITY): Payer: Self-pay

## 2022-05-19 LAB — ANTIFUNGAL AST 9 DRUG PANEL
Amphotericin B MIC: 1
Fluconazole Islt MIC: 0.5
Flucytosine MIC: 1
Itraconazole MIC: 0.12
Posaconazole MIC: 0.06

## 2022-06-10 IMAGING — DX DG CHEST 2V
2 series · 2 of 2 positions shown · non-contrast
Comparison: 04/26/2021

CLINICAL DATA: 70-year-old male with a history bladder cancer

EXAM:
CHEST - 2 VIEW

[chest pa]
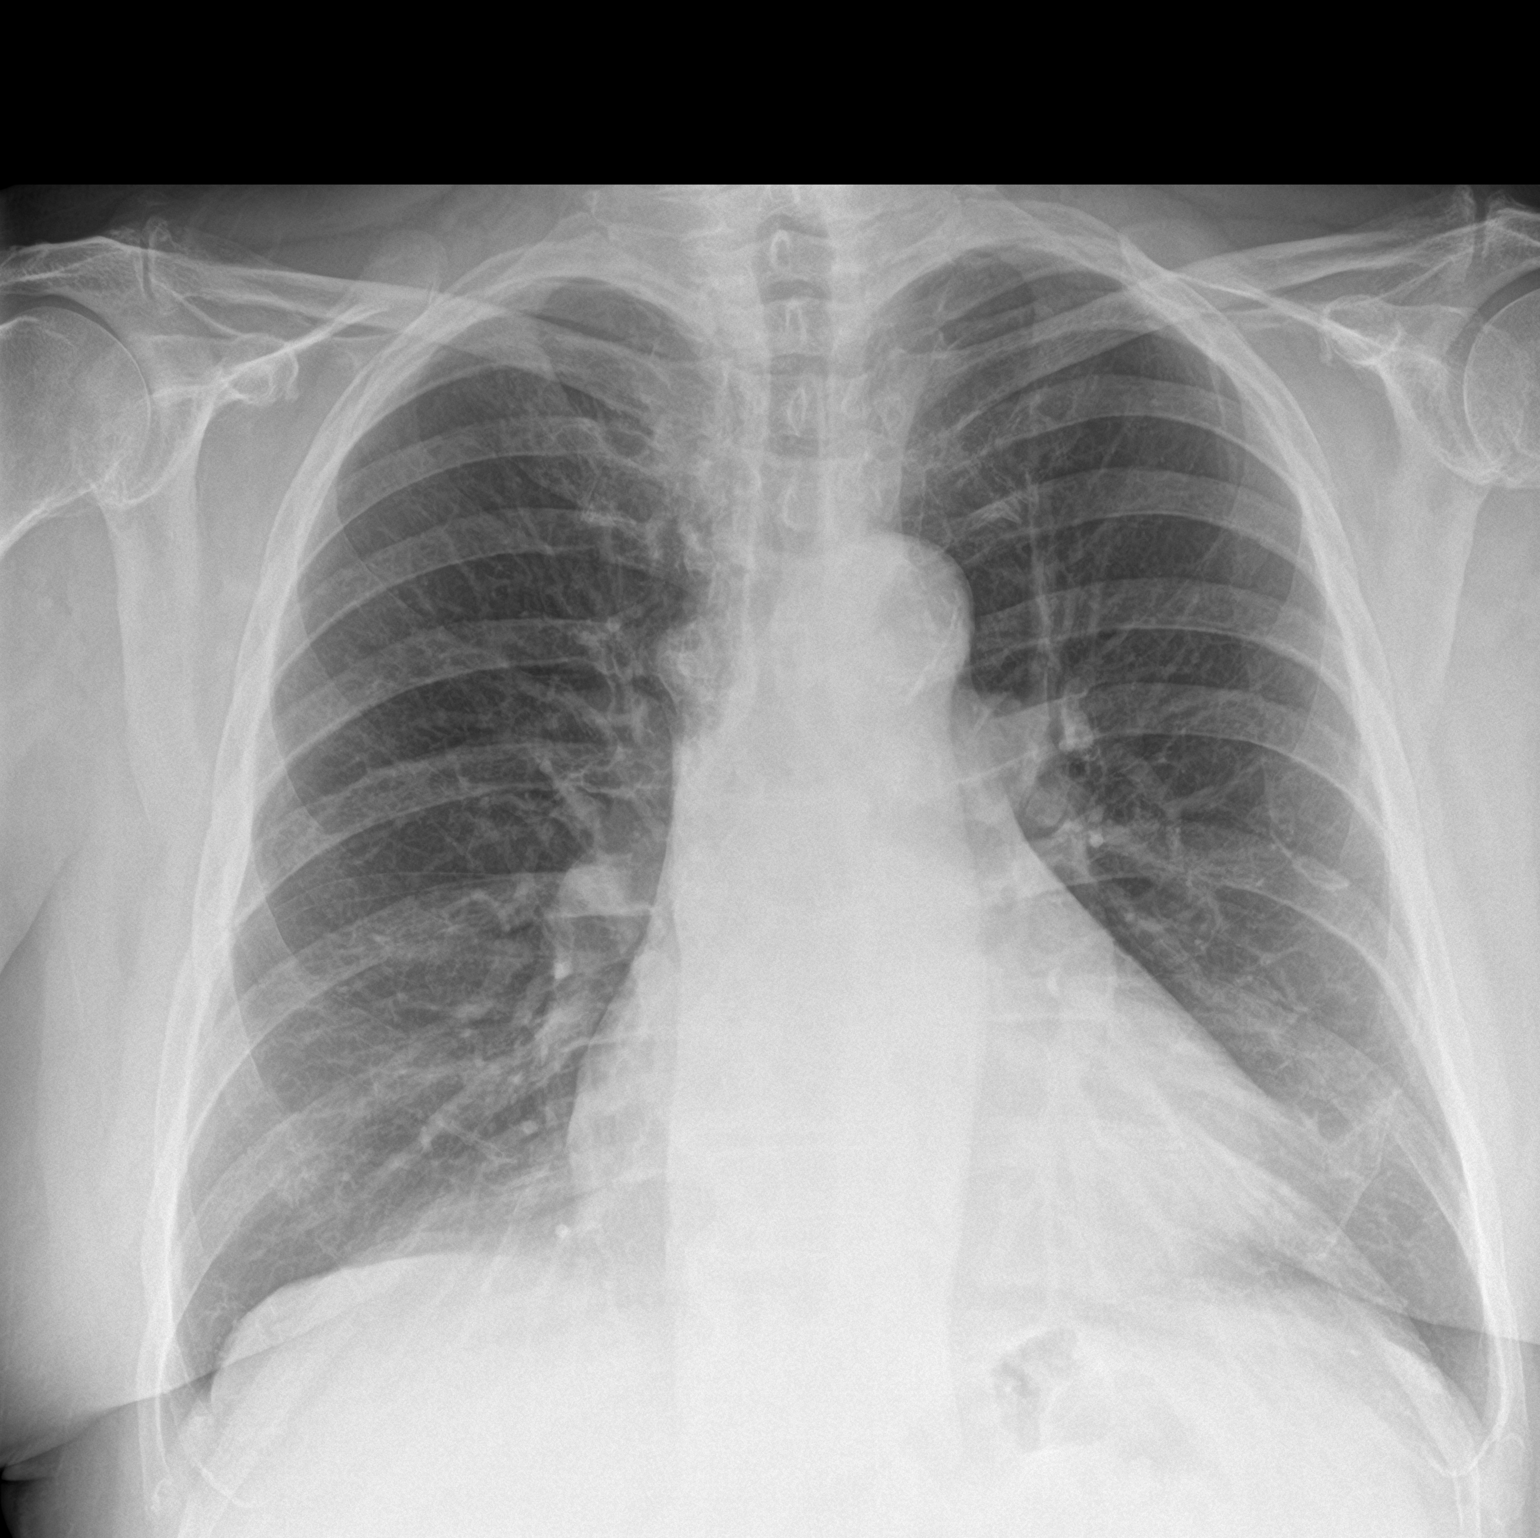

[chest lat]
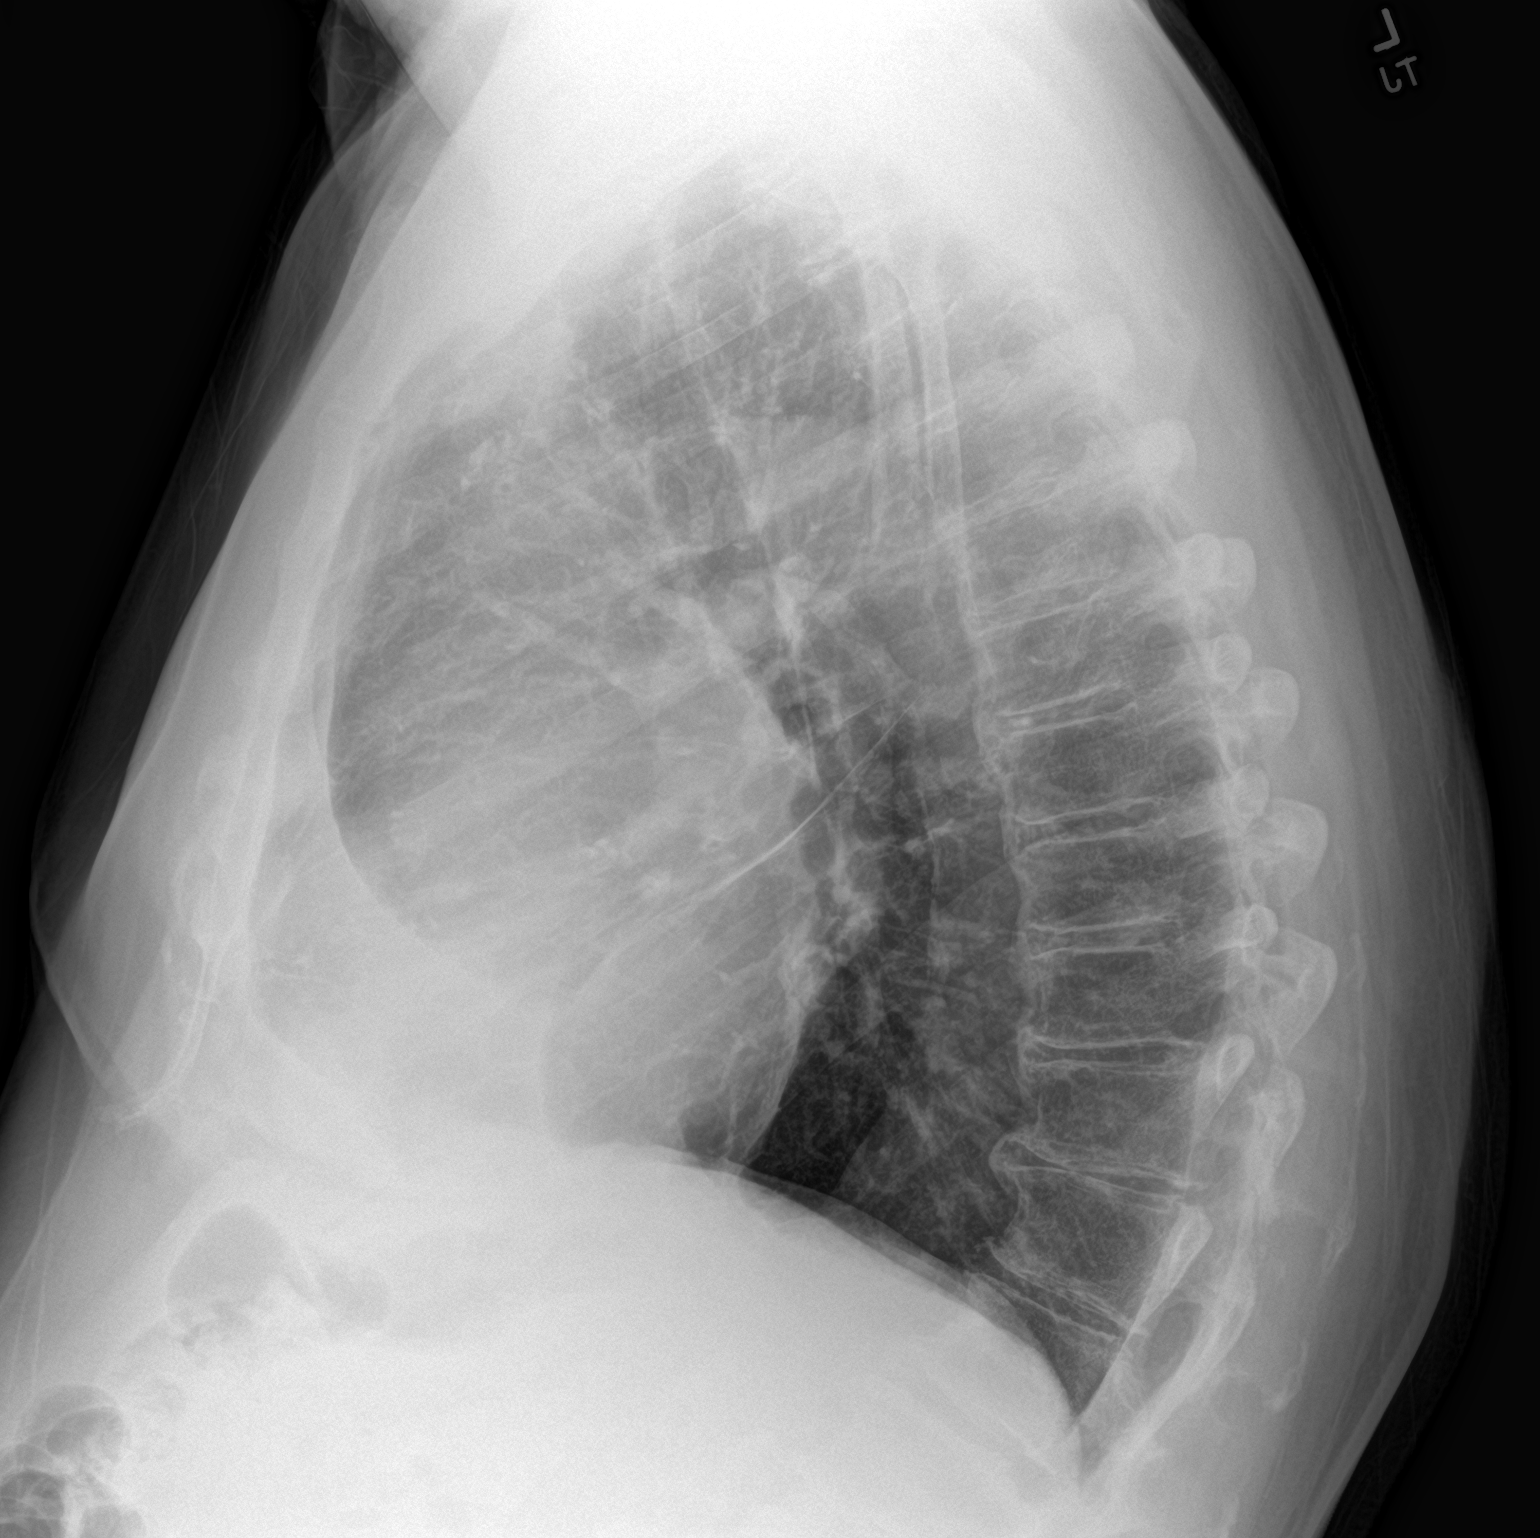

[2 of 2 positions shown; findings below may reference images not displayed]

FINDINGS: Cardiomediastinal silhouette unchanged in size and contour. No
evidence of central vascular congestion. No interlobular septal
thickening.

No pneumothorax or pleural effusion. Coarsened interstitial
markings, with no confluent airspace disease.

No acute displaced fracture. Degenerative changes of the spine.
IMPRESSION: Negative for acute cardiopulmonary disease

## 2022-06-20 ENCOUNTER — Ambulatory Visit (HOSPITAL_COMMUNITY)
Admission: RE | Admit: 2022-06-20 | Discharge: 2022-06-20 | Disposition: A | Discharge status: Home / Self Care | Payer: Medicare Other | Source: Ambulatory Visit | Attending: Urology | Admitting: Urology

## 2022-06-20 ENCOUNTER — Other Ambulatory Visit (HOSPITAL_COMMUNITY): Payer: Self-pay | Admitting: Urology

## 2022-06-20 DIAGNOSIS — C678 Malignant neoplasm of overlapping sites of bladder: Secondary | ICD-10-CM | POA: Diagnosis present

## 2022-12-27 ENCOUNTER — Other Ambulatory Visit (HOSPITAL_COMMUNITY): Payer: Self-pay | Admitting: Urology

## 2022-12-27 ENCOUNTER — Ambulatory Visit (HOSPITAL_COMMUNITY)
Admission: RE | Admit: 2022-12-27 | Discharge: 2022-12-27 | Disposition: A | Discharge status: Home / Self Care | Payer: Medicare Other | Source: Ambulatory Visit | Attending: Urology | Admitting: Urology

## 2022-12-27 DIAGNOSIS — C678 Malignant neoplasm of overlapping sites of bladder: Secondary | ICD-10-CM | POA: Diagnosis present

## 2023-10-27 ENCOUNTER — Encounter (HOSPITAL_COMMUNITY): Payer: Self-pay | Admitting: Emergency Medicine

## 2023-10-27 ENCOUNTER — Emergency Department (HOSPITAL_COMMUNITY)

## 2023-10-27 ENCOUNTER — Other Ambulatory Visit: Payer: Self-pay

## 2023-10-27 ENCOUNTER — Inpatient Hospital Stay (HOSPITAL_COMMUNITY)
Admission: EM | Admit: 2023-10-27 | Discharge: 2023-11-01 | DRG: 193 | Disposition: A | Discharge status: Home / Self Care | Attending: Internal Medicine | Admitting: Internal Medicine

## 2023-10-27 DIAGNOSIS — I2510 Atherosclerotic heart disease of native coronary artery without angina pectoris: Secondary | ICD-10-CM | POA: Diagnosis present

## 2023-10-27 DIAGNOSIS — Z86718 Personal history of other venous thrombosis and embolism: Secondary | ICD-10-CM | POA: Diagnosis not present

## 2023-10-27 DIAGNOSIS — Z6831 Body mass index (BMI) 31.0-31.9, adult: Secondary | ICD-10-CM | POA: Diagnosis not present

## 2023-10-27 DIAGNOSIS — D7589 Other specified diseases of blood and blood-forming organs: Secondary | ICD-10-CM | POA: Diagnosis present

## 2023-10-27 DIAGNOSIS — C679 Malignant neoplasm of bladder, unspecified: Secondary | ICD-10-CM | POA: Diagnosis present

## 2023-10-27 DIAGNOSIS — Y738 Miscellaneous gastroenterology and urology devices associated with adverse incidents, not elsewhere classified: Secondary | ICD-10-CM | POA: Diagnosis present

## 2023-10-27 DIAGNOSIS — E66811 Obesity, class 1: Secondary | ICD-10-CM | POA: Diagnosis present

## 2023-10-27 DIAGNOSIS — J189 Pneumonia, unspecified organism: Principal | ICD-10-CM | POA: Diagnosis present

## 2023-10-27 DIAGNOSIS — R0902 Hypoxemia: Secondary | ICD-10-CM

## 2023-10-27 DIAGNOSIS — I480 Paroxysmal atrial fibrillation: Secondary | ICD-10-CM | POA: Diagnosis present

## 2023-10-27 DIAGNOSIS — I130 Hypertensive heart and chronic kidney disease with heart failure and stage 1 through stage 4 chronic kidney disease, or unspecified chronic kidney disease: Secondary | ICD-10-CM | POA: Diagnosis present

## 2023-10-27 DIAGNOSIS — N390 Urinary tract infection, site not specified: Secondary | ICD-10-CM | POA: Diagnosis present

## 2023-10-27 DIAGNOSIS — Z79899 Other long term (current) drug therapy: Secondary | ICD-10-CM

## 2023-10-27 DIAGNOSIS — Y833 Surgical operation with formation of external stoma as the cause of abnormal reaction of the patient, or of later complication, without mention of misadventure at the time of the procedure: Secondary | ICD-10-CM | POA: Diagnosis present

## 2023-10-27 DIAGNOSIS — J159 Unspecified bacterial pneumonia: Principal | ICD-10-CM | POA: Diagnosis present

## 2023-10-27 DIAGNOSIS — I351 Nonrheumatic aortic (valve) insufficiency: Secondary | ICD-10-CM | POA: Diagnosis present

## 2023-10-27 DIAGNOSIS — N179 Acute kidney failure, unspecified: Secondary | ICD-10-CM | POA: Diagnosis present

## 2023-10-27 DIAGNOSIS — Z7901 Long term (current) use of anticoagulants: Secondary | ICD-10-CM

## 2023-10-27 DIAGNOSIS — Z87440 Personal history of urinary (tract) infections: Secondary | ICD-10-CM

## 2023-10-27 DIAGNOSIS — I429 Cardiomyopathy, unspecified: Secondary | ICD-10-CM | POA: Diagnosis present

## 2023-10-27 DIAGNOSIS — N99521 Infection of other external stoma of urinary tract: Secondary | ICD-10-CM | POA: Diagnosis present

## 2023-10-27 DIAGNOSIS — J9601 Acute respiratory failure with hypoxia: Secondary | ICD-10-CM | POA: Diagnosis present

## 2023-10-27 DIAGNOSIS — E785 Hyperlipidemia, unspecified: Secondary | ICD-10-CM | POA: Diagnosis present

## 2023-10-27 DIAGNOSIS — Z7951 Long term (current) use of inhaled steroids: Secondary | ICD-10-CM | POA: Diagnosis not present

## 2023-10-27 DIAGNOSIS — I5032 Chronic diastolic (congestive) heart failure: Secondary | ICD-10-CM | POA: Diagnosis present

## 2023-10-27 DIAGNOSIS — Z87891 Personal history of nicotine dependence: Secondary | ICD-10-CM | POA: Diagnosis not present

## 2023-10-27 DIAGNOSIS — N1831 Chronic kidney disease, stage 3a: Secondary | ICD-10-CM | POA: Diagnosis present

## 2023-10-27 DIAGNOSIS — N3001 Acute cystitis with hematuria: Secondary | ICD-10-CM | POA: Diagnosis present

## 2023-10-27 LAB — URINALYSIS, ROUTINE W REFLEX MICROSCOPIC
Bilirubin Urine: NEGATIVE
Glucose, UA: NEGATIVE mg/dL
Ketones, ur: NEGATIVE mg/dL
Nitrite: NEGATIVE
Protein, ur: 100 mg/dL — AB
Specific Gravity, Urine: 1.015 (ref 1.005–1.030)
WBC, UA: >50 WBC/hpf (ref 0–5)
pH: 7 (ref 5.0–8.0)

## 2023-10-27 LAB — CBC WITH DIFFERENTIAL/PLATELET
Abs Immature Granulocytes: 0.04 10*3/uL (ref 0.00–0.07)
Basophils Absolute: 0 10*3/uL (ref 0.0–0.1)
Basophils Relative: 0 %
Eosinophils Absolute: 0 10*3/uL (ref 0.0–0.5)
Eosinophils Relative: 0 %
HCT: 53.2 % — ABNORMAL HIGH (ref 39.0–52.0)
Hemoglobin: 17.3 g/dL — ABNORMAL HIGH (ref 13.0–17.0)
Immature Granulocytes: 0 %
Lymphocytes Relative: 7 %
Lymphs Abs: 0.7 10*3/uL (ref 0.7–4.0)
MCH: 33.1 pg (ref 26.0–34.0)
MCHC: 32.5 g/dL (ref 30.0–36.0)
MCV: 101.9 fL — ABNORMAL HIGH (ref 80.0–100.0)
Monocytes Absolute: 0.8 10*3/uL (ref 0.1–1.0)
Monocytes Relative: 8 %
Neutro Abs: 8.1 10*3/uL — ABNORMAL HIGH (ref 1.7–7.7)
Neutrophils Relative %: 85 %
Platelets: 166 10*3/uL (ref 150–400)
RBC: 5.22 MIL/uL (ref 4.22–5.81)
RDW: 14.2 % (ref 11.5–15.5)
WBC: 9.6 10*3/uL (ref 4.0–10.5)
nRBC: 0 % (ref 0.0–0.2)

## 2023-10-27 LAB — COMPREHENSIVE METABOLIC PANEL WITH GFR
ALT: 16 U/L (ref 0–44)
AST: 28 U/L (ref 15–41)
Albumin: 4 g/dL (ref 3.5–5.0)
Alkaline Phosphatase: 100 U/L (ref 38–126)
Anion gap: 10 (ref 5–15)
BUN: 26 mg/dL — ABNORMAL HIGH (ref 8–23)
CO2: 27 mmol/L (ref 22–32)
Calcium: 9.1 mg/dL (ref 8.9–10.3)
Chloride: 103 mmol/L (ref 98–111)
Creatinine, Ser: 1.52 mg/dL — ABNORMAL HIGH (ref 0.61–1.24)
GFR, Estimated: 48 mL/min — ABNORMAL LOW (ref >60)
Glucose, Bld: 142 mg/dL — ABNORMAL HIGH (ref 70–99)
Potassium: 4.4 mmol/L (ref 3.5–5.1)
Sodium: 140 mmol/L (ref 135–145)
Total Bilirubin: 1.6 mg/dL — ABNORMAL HIGH (ref 0.0–1.2)
Total Protein: 8.3 g/dL — ABNORMAL HIGH (ref 6.5–8.1)

## 2023-10-27 LAB — TROPONIN I (HIGH SENSITIVITY)
Troponin I (High Sensitivity): 31 ng/L — ABNORMAL HIGH (ref <18)
Troponin I (High Sensitivity): 19 ng/L — ABNORMAL HIGH (ref <18)

## 2023-10-27 MED ORDER — SODIUM CHLORIDE 0.9 % IV SOLN
2.0000 g | INTRAVENOUS | Status: AC
  Administered 2023-10-28 – 2023-11-01 (×5): 2 g via INTRAVENOUS
  Filled 2023-10-27 (×5): qty 20

## 2023-10-27 MED ORDER — IPRATROPIUM BROMIDE 0.02 % IN SOLN: 0.5000 mg | Freq: Three times a day (TID) | RESPIRATORY_TRACT | Status: DC

## 2023-10-27 MED ORDER — METOPROLOL TARTRATE 25 MG PO TABS
25.0000 mg | ORAL_TABLET | Freq: Two times a day (BID) | ORAL | Status: DC
  Administered 2023-10-28 – 2023-11-01 (×10): 25 mg via ORAL
  Filled 2023-10-27 (×10): qty 1

## 2023-10-27 MED ORDER — MAGNESIUM SULFATE 2 GM/50ML IV SOLN
2.0000 g | Freq: Once | INTRAVENOUS | Status: AC
  Administered 2023-10-27: 2 g via INTRAVENOUS
  Filled 2023-10-27: qty 50

## 2023-10-27 MED ORDER — IPRATROPIUM-ALBUTEROL 0.5-2.5 (3) MG/3ML IN SOLN
3.0000 mL | Freq: Once | RESPIRATORY_TRACT | Status: AC
  Administered 2023-10-27: 3 mL via RESPIRATORY_TRACT
  Filled 2023-10-27: qty 3

## 2023-10-27 MED ORDER — IOHEXOL 300 MG/ML  SOLN
100.0000 mL | Freq: Once | INTRAMUSCULAR | Status: AC | PRN
  Administered 2023-10-27: 100 mL via INTRAVENOUS

## 2023-10-27 MED ORDER — LEVALBUTEROL HCL 0.63 MG/3ML IN NEBU
INHALATION_SOLUTION | RESPIRATORY_TRACT | Status: AC
  Filled 2023-10-27: qty 3

## 2023-10-27 MED ORDER — LEVALBUTEROL HCL 0.63 MG/3ML IN NEBU
0.6300 mg | INHALATION_SOLUTION | Freq: Three times a day (TID) | RESPIRATORY_TRACT | Status: DC
  Administered 2023-10-27 – 2023-11-01 (×14): 0.63 mg via RESPIRATORY_TRACT
  Filled 2023-10-27 (×14): qty 3

## 2023-10-27 MED ORDER — DEXAMETHASONE SODIUM PHOSPHATE 10 MG/ML IJ SOLN
10.0000 mg | Freq: Once | INTRAMUSCULAR | Status: AC
  Administered 2023-10-27: 10 mg via INTRAVENOUS
  Filled 2023-10-27: qty 1

## 2023-10-27 MED ORDER — LEVALBUTEROL HCL 0.63 MG/3ML IN NEBU: 0.6300 mg | INHALATION_SOLUTION | Freq: Four times a day (QID) | RESPIRATORY_TRACT | Status: DC | PRN

## 2023-10-27 MED ORDER — ALBUTEROL SULFATE HFA 108 (90 BASE) MCG/ACT IN AERS
2.0000 | INHALATION_SPRAY | RESPIRATORY_TRACT | Status: DC | PRN
Start: 2023-10-27 — End: 2023-10-27
  Filled 2023-10-27: qty 6.7

## 2023-10-27 MED ORDER — LEVALBUTEROL HCL 0.63 MG/3ML IN NEBU: 0.6300 mg | INHALATION_SOLUTION | Freq: Three times a day (TID) | RESPIRATORY_TRACT | Status: DC

## 2023-10-27 MED ORDER — IPRATROPIUM BROMIDE 0.02 % IN SOLN
0.5000 mg | Freq: Three times a day (TID) | RESPIRATORY_TRACT | Status: DC
  Administered 2023-10-27 – 2023-11-01 (×14): 0.5 mg via RESPIRATORY_TRACT
  Filled 2023-10-27 (×14): qty 2.5

## 2023-10-27 MED ORDER — IPRATROPIUM BROMIDE 0.02 % IN SOLN
RESPIRATORY_TRACT | Status: AC
  Filled 2023-10-27: qty 2.5

## 2023-10-27 MED ORDER — SODIUM CHLORIDE 0.9 % IV SOLN
1.0000 g | Freq: Once | INTRAVENOUS | Status: AC
  Administered 2023-10-27: 1 g via INTRAVENOUS
  Filled 2023-10-27: qty 10

## 2023-10-27 MED ORDER — SODIUM CHLORIDE 0.9 % IV SOLN: INTRAVENOUS | Status: DC

## 2023-10-27 MED ORDER — SODIUM CHLORIDE 0.9 % IV SOLN
500.0000 mg | INTRAVENOUS | Status: DC
  Administered 2023-10-28: 500 mg via INTRAVENOUS
  Filled 2023-10-27: qty 5

## 2023-10-27 MED ORDER — AZITHROMYCIN 250 MG PO TABS
500.0000 mg | ORAL_TABLET | Freq: Once | ORAL | Status: AC
  Administered 2023-10-27: 500 mg via ORAL
  Filled 2023-10-27: qty 2

## 2023-10-27 NOTE — ED Notes (Signed)
 Patient transported to X-ray

## 2023-10-27 NOTE — ED Provider Notes (Signed)
 [place] EMERGENCY DEPARTMENT AT [facility] Provider Note   CSN: [id number] Arrival date & time: 10/27/23  1730     History  Chief Complaint  Patient presents with   Shortness of Breath   Hematuria   Weakness    Zachary Shannon. is a 73 y.o. male, hx of bladder cancer, who presents to the ED 2/2 to 2 different complaints.  He states 2 days ago, he had his blood in your his urostomy, and he thinks he passed 2 bladder stones.  He has not had that since he had his bladder removed.  He still he was told by his urologist, if that he passes any kind of blood, from his urostomy, he needs to return to the ER.  He denies any pain, but wife at bedside, states he had some cramping in his back,  before that.  Also states that he is a previous smoker, and he has had worsening shortness of breath, cough, and bodyaches, for the last  3 days.  Does not use inhalers, but has been more short of breath, has had a nonproductive cough, and bodyaches.  Denies any fevers or chills.  Denies any ill contacts  Home Medications Prior to Admission medications   Medication Sig Start Date End Date Taking? Authorizing Provider  atorvastatin (LIPITOR) 10 MG tablet Take 10 mg by mouth at bedtime.    [provider]  cyanocobalamin (,VITAMIN B-12,) 1000 MCG/ML injection Inject 1,000 mcg into the muscle every 30 (thirty) days. 04/14/21 04/21/26  [provider]  diltiazem (DILACOR XR) 240 MG 24 hr capsule Take 240 mg by mouth daily.    [provider]  metoprolol tartrate (LOPRESSOR) 25 MG tablet Take 25 mg by mouth 2 (two) times daily.    [provider]  ondansetron (ZOFRAN-ODT) 4 MG disintegrating tablet Take 1 tablet (4 mg total) by mouth every 8 (eight) hours as needed for nausea or vomiting. 12/02/21   Shon Hough, NP  Vitamin D, Ergocalciferol, (DRISDOL) 1.25 MG (50000 UNIT) CAPS capsule Take 50,000 Units by mouth once a week. 10/09/21   [provider]  XARELTO 20 MG TABS tablet Take 20 mg by mouth daily. 11/15/21   [provider]      Allergies    Patient has no known allergies.    Review of Systems   Review of Systems  Constitutional:  Negative for fever.  Respiratory:  Positive for shortness of breath.   Genitourinary:  Positive for hematuria.  Neurological:  Positive for weakness.    Physical Exam Updated Vital Signs BP 109/70   Pulse 85   Temp 98.8 F (37.1 C) (Oral)   Resp (!) 21   SpO2 95%  Physical Exam Vitals and nursing note reviewed.  Constitutional:      General: He is not in acute distress.    Appearance: He is well-developed.  HENT:     Head: Normocephalic and atraumatic.  Eyes:     Conjunctiva/sclera: Conjunctivae normal.  Cardiovascular:     Rate and Rhythm: Normal rate and regular rhythm.     Heart sounds: No murmur heard. Pulmonary:     Effort: Pulmonary effort is normal. No respiratory distress.     Breath sounds: Examination of the right-upper field reveals rhonchi. Examination of the left-upper field reveals rhonchi. Examination of the right-lower field reveals decreased breath sounds. Examination of the left-lower field reveals decreased breath sounds. Decreased breath sounds and rhonchi present.  Comments: +5L O2 Abdominal:     Palpations: Abdomen is soft.     Tenderness: There is no abdominal tenderness.     Comments: +nonbloody urostomy in place  Musculoskeletal:        General: No swelling.     Cervical back: Neck supple.  Skin:    General: Skin is warm and dry.     Capillary Refill: Capillary refill takes less than 2 seconds.  Neurological:     Mental Status: He is alert.  Psychiatric:        Mood and Affect: Mood normal.     ED Results / Procedures / Treatments   Labs (all labs ordered are listed, but only abnormal results are displayed) Labs Reviewed  URINALYSIS, ROUTINE W REFLEX MICROSCOPIC - Abnormal; Notable for the following components:      Result Value    Color, Urine AMBER (*)    APPearance CLOUDY (*)    Hgb urine dipstick [place] (*)    Protein, ur 100 (*)    Leukocytes,Ua MODERATE (*)    Bacteria, UA MANY (*)    All other components within normal limits  CBC WITH DIFFERENTIAL/PLATELET - Abnormal; Notable for the following components:   Hemoglobin 17.3 (*)    HCT 53.2 (*)    MCV 101.9 (*)    Neutro Abs 8.1 (*)    All other components within normal limits  COMPREHENSIVE METABOLIC PANEL WITH GFR - Abnormal; Notable for the following components:   Glucose, Bld 142 (*)    BUN 26 (*)    Creatinine, Ser 1.52 (*)    Total Protein 8.3 (*)    Total Bilirubin 1.6 (*)    GFR, Estimated 48 (*)    All other components within normal limits  TROPONIN I (HIGH SENSITIVITY) - Abnormal; Notable for the following components:   Troponin I (High Sensitivity) 31 (*)    All other components within normal limits  URINE CULTURE  CALCULI, WITH PHOTOGRAPH (CLINICAL LAB)  TROPONIN I (HIGH SENSITIVITY)    EKG EKG Interpretation Date/Time:  Saturday October 27 2023 18:03:37 EDT Ventricular Rate:  84 PR Interval:    QRS Duration:  102 QT Interval:  392 QTC Calculation: 464 R Axis:   71  Text Interpretation: Age not entered, assumed to be  73 years old for purpose of ECG interpretation Atrial fibrillation Repol abnrm suggests ischemia, lateral leads Confirmed by Ernie Avena ([id number]) on 10/27/2023 6:53:51 PM  Radiology DG Chest 2 View Result Date: 10/27/2023 CLINICAL DATA:  Dyspnea EXAM: CHEST - 2 VIEW COMPARISON:  12/27/2022 FINDINGS: Focal opacity within the right lower lobe is in keeping with a focal pneumonic infiltrate in the appropriate clinical setting. No pneumothorax or pleural effusion. Cardiac size within normal limits. Osseous structures are age-appropriate. IMPRESSION: 1. Right lower lobe pneumonia. Follow-up chest radiograph is recommended in 3-4 weeks, following conservative therapy, to document resolution. If persistent at that time, dedicated  contrast enhanced CT imaging of the chest is recommended for further evaluation. Electronically Signed   By: Helyn Numbers M.D.   On: 10/27/2023 19:12    Procedures Procedures    Medications Ordered in ED Medications  albuterol (VENTOLIN HFA) 108 (90 Base) MCG/ACT inhaler 2 puff (has no administration in time range)  cefTRIAXone (ROCEPHIN) 1 g in sodium chloride 0.9 % 100 mL IVPB (has no administration in time range)  azithromycin (ZITHROMAX) tablet 500 mg (has no administration in time range)  ipratropium-albuterol (DUONEB) 0.5-2.5 (3) MG/3ML nebulizer solution 3 mL (3  mLs Nebulization Given 10/27/23 1829)  magnesium sulfate IVPB 2 g 50 mL (0 g Intravenous Stopped 10/27/23 1915)  dexamethasone (DECADRON) injection 10 mg (10 mg Intravenous Given 10/27/23 1829)  iohexol (OMNIPAQUE) 300 MG/ML solution 100 mL (100 mLs Intravenous Contrast Given 10/27/23 1930)    ED Course/ Medical Decision Making/ A&P Clinical Course as of 10/27/23 1949  Sat Oct 27, 2023  1817 Stable 59 YOM with a chief complaint of hematuria and possible UL  SOB acute on chronic. URI symptoms.  [CC]    Clinical Course User Index [CC] Glyn Ade, MD                                 Medical Decision Making Patient is 73 year old male, history of bladder cancer, with a urostomy, here for 2 different complaints, 1 being blood in his urine that occurred 2 days ago, with possible stones.  He states that Dr. Kathrynn Running his previous urologist told him to come to the ER, if this ever happens.  He states he had a little pain in his back, before this happened, and he has not had any fevers or chills.  Notes that he may have passed 2 stones however.  He has a complains of some upper respiratory symptoms, and he is found to be hypoxic, requiring 5 L of O2, by nursing staff initially, he has been tapered down to 2 L, however still requiring oxygen, and we will obtain chest x-ray, blood work and troponin for further evaluation.  He does  not have any pitting edema on my exam, and reports his weight has been rather stable, and he has had respiratory complaints, thus I think is more likely to be secondary to an upper respiratory infection, versus pneumonia that it is heart, related we will give him a DuoNeb, magnesium, and steroids for further eval ration  Amount and/or Complexity of Data Reviewed Labs: ordered.    Details: Many bacteria in the urine Radiology: ordered.    Details: X-ray shows right lower lobe pneumonia Discussion of management or test interpretation with external provider(s): Patient has a UTI, with right lower lobe pneumonia, treated with ceftriaxone and azithromycin.  Given that he is hypoxic, and on 5 L of O2, in the room, and is not typically on any oxygen will admit for pneumonia with hypoxia.  He also has a urinary tract infection the ceftriaxone should cover for this, however urine culture has been sent.  He has a pending CT abdomen pelvis, to further evaluate for any stones, I placed an order for renal calculi, to be assessed, as he states he has some in his urostomy pouch.  I handed this off to [place], [place], with plans to admit to hospitalist, for UTI, pneumonia with hypoxia, he will follow-up on the CT abdomen pelvis results, and admit to hospitalist  Risk Prescription drug management. Decision regarding hospitalization.    Final Clinical Impression(s) / ED Diagnoses Final diagnoses:  Pneumonia of right lower lobe due to infectious organism  Hypoxia  Urinary tract infection with hematuria, site unspecified    Rx / DC Orders ED Discharge Orders     None         Kiahna Banghart, [place], PA 10/27/23 1949    Glyn Ade, MD 10/28/23 1305

## 2023-10-27 NOTE — Progress Notes (Signed)
 Pt arrived to unit from ED via gurney. Ambulatory with SB assist to bed. Pt placed on tele at this time. Two RN skin check done with charge nurse.

## 2023-10-27 NOTE — ED Notes (Signed)
 Pt daughter, who is at bedside, expressed concern to NT and EMT-P about pt shortness of breath while ambulating. She states that she would like his SpO2 checked while walking. Provider notified.

## 2023-10-27 NOTE — ED Notes (Signed)
Patient transported to CT on stretcher

## 2023-10-27 NOTE — H&P (Signed)
 History and Physical    Patient: Zachary Shannon. ZOX:[id number] DOB: [birth year] DOA: 10/27/2023 DOS: the patient was seen and examined on 10/27/2023 PCP: Jarvis Morgan, DO  Patient coming from: Home  Chief Complaint:  Chief Complaint  Patient presents with   Shortness of Breath   Hematuria   Weakness   HPI: Zachary Shannon. is a 73 y.o. male with medical history significant of bladder cancer, diastolic dysfunction CHF, chronic kidney disease stage III, coronary artery disease, history of aortic insufficiency, atrial fibrillation on chronic anticoagulation, hyperlipidemia who presented to the ER with hematuria.  Also shortness of breath and cough.  Patient and family initially thought this could be due to kidney stone.  Patient has urostomy bag in place from ileal conduit surgery.  He has had previous recurrent UTIs.  He denied fever or chills.  He has had productive cough for at least 3 days.  Workup in the ER including chest x-ray showed pneumonia.  Urinalysis consistent with possible UTI although patient with urostomy tube.  He has no significant white count elevation.  Patient is on Xarelto.  His H&H is however stable.  Patient is being admitted with acute hypoxic respiratory failure 1 year.  Oxygen saturation was 89% on room air currently on 5 L  Review of Systems: As mentioned in the history of present illness. All other systems reviewed and are negative. Past Medical History:  Diagnosis Date   Anticoagulant long-term use    eliquis--- managed by cardiology   Aortic insufficiency    Cancer (HCC)    bladder   Cardiomyopathy (HCC)    dx 2009 per cardiac cath w/ ef 20-25%;   per cardiology note    CHF (congestive heart failure) (HCC)    Chronic kidney disease    Coronary artery disease cardiologist--- dr Danna Hefty  ([facility] in [place] )   per cardiologist note dated 09-16-2019 , received via fax,  pt had cardiac cath 2009 done at  [facility] that showed CTO of LAD with excellent collateralization from LCx and RCA, no PCI performed, sig. cardiomyopathy ef 20-25%;  per note stated improved to 45-50% (no documentation available in note and no echo)   Dysrhythmia    Afib   Hematuria    History of deep vein thrombosis (DVT) of lower extremity 2021   HLD (hyperlipidemia)    Hypertension    followed by pcp   Mitral insufficiency    PAF (paroxysmal atrial fibrillation) [facility])    cardiologist--- dr Danna Hefty--- dx 2009   Tricuspid insufficiency    Past Surgical History:  Procedure Laterality Date   CARDIAC CATHETERIZATION  2009   @[facility]   per pt done due to dx AFib;  told no blockage's   CATARACT EXTRACTION W/ INTRAOCULAR LENS  IMPLANT, BILATERAL  02/2019   CYSTOSCOPY WITH INJECTION N/A 10/27/2020   Procedure: CYSTOSCOPY WITH INJECTION OF INDOCYANINE GREEN DYE;  Surgeon: Sebastian Ache, MD;  Location: [facility];  Service: Urology;  Laterality: N/A;   IR NEPHROSTOMY PLACEMENT LEFT  12/02/2021   LYMPH NODE DISSECTION Bilateral 10/27/2020   Procedure: LYMPH NODE DISSECTION;  Surgeon: Sebastian Ache, MD;  Location: [facility];  Service: Urology;  Laterality: Bilateral;   ROBOT ASSISTED LAPAROSCOPIC COMPLETE CYSTECT ILEAL CONDUIT N/A 10/27/2020   Procedure: XI ROBOTIC ASSISTED LAPAROSCOPIC COMPLETE CYSTECT ILEAL CONDUIT, RADICAL PROSTATECTOMY AND BILATERAL INGUINAL HERNIA REPAIR;  Surgeon: Sebastian Ache, MD;  Location: [facility];  Service: Urology;  Laterality: N/A;  6  HRS   TRANSURETHRAL RESECTION OF BLADDER TUMOR N/A 10/29/2019   Procedure: TRANSURETHRAL RESECTION OF BLADDER TUMOR  (TURBT), CYSTOSCOPY/ INTRAVESICAL INSTILLATION OF GEMCITABINE;  Surgeon: Rene Paci, MD;  Location: [facility];  Service: Urology;  Laterality: N/A;   TRANSURETHRAL RESECTION OF BLADDER TUMOR N/A 07/14/2020   Procedure: TRANSURETHRAL RESECTION OF BLADDER TUMOR (TURBT) WITH CYSTOSCOPY/ GEMCITABINE  INSTILLATION;  Surgeon: Rene Paci, MD;  Location: [facility];  Service: Urology;  Laterality: N/A;   Social History:  reports that he quit smoking about 15 years ago. His smoking use included cigarettes. He started smoking about 43 years ago. He has a 14 pack-year smoking history. He quit smokeless tobacco use about 15 years ago. He reports that he does not drink alcohol and does not use drugs.  No Known Allergies  History reviewed. No pertinent family history.  Prior to Admission medications   Medication Sig Start Date End Date Taking? Authorizing Provider  Cholecalciferol 50 MCG (2000 UT) CAPS Take 1 tablet by mouth daily. 08/28/23  Yes [provider]  cyanocobalamin (VITAMIN B12) 1000 MCG tablet Take 1,000 mcg by mouth every other day.   Yes [provider]  atorvastatin (LIPITOR) 10 MG tablet Take 10 mg by mouth at bedtime.   Yes [provider]  diltiazem (DILACOR XR) 240 MG 24 hr capsule Take 240 mg by mouth at bedtime.   Yes [provider]  metoprolol tartrate (LOPRESSOR) 25 MG tablet Take 25 mg by mouth 2 (two) times daily.   Yes [provider]  XARELTO 20 MG TABS tablet Take 20 mg by mouth at bedtime. 11/15/21  Yes [provider]    Physical Exam: Vitals:   10/27/23 1913 10/27/23 1918 10/27/23 2026 10/27/23 2030  BP: 109/70   121/83  Pulse: 85   80  Resp: (!) 21   (!) 25  Temp:  98.8 F (37.1 C)    TempSrc:  Oral    SpO2: 95%   96%  Weight:   108.9 kg   Height:   6' (1.829 m)    Constitutional: Acutely ill looking, morbidly obese, NAD, calm, comfortable Eyes: PERRL, lids and conjunctivae normal ENMT: Mucous membranes are moist. Posterior pharynx clear of any exudate or lesions.Normal dentition.  Neck: normal, supple, no masses, no thyromegaly Respiratory: Coarse breath sounds bilaterally, no wheezing, no crackles. Normal respiratory effort. No accessory muscle use.  Cardiovascular: Irregularly irregular  rate and rhythm, no murmurs / rubs / gallops. No extremity edema. 2+ pedal pulses. No carotid bruits.  Abdomen: no tenderness, no masses palpated. No hepatosplenomegaly. Bowel sounds positive.  GU: Bilateral related conduit with urostomy bag Musculoskeletal: Good range of motion, no joint swelling or tenderness, Skin: no rashes, lesions, ulcers. No induration Neurologic: CN 2-12 grossly intact. Sensation intact, DTR normal. Strength 5/5 in all 4.  Psychiatric: Normal judgment and insight. Alert and oriented x 3. Normal mood  Data Reviewed:  Temperature 98.8, blood pressure 109/70, respiratory 25 oxygen sat 87% on room air 96% on 5 L.  White count 9.6 hemoglobin 17.3 and platelets 166.  Creatinine is 1.52 BUN 26 glucose 142 urinalysis showed cloudy urine with moderate leukocytes WBC more than 50 many bacteria RBC 11-20 CT abdomen and pelvis showed cystectomy with ileal conduit no ureteral lithiasis or anything to explain hematuria.  Colonic diverticulosis cholelithiasis and hepatic steatosis.  Chest x-ray shows right lower lobe pneumonia  Assessment and Plan:  #1 acute hypoxic respiratory failure: Secondary to right lower lobe  pneumonia.  Patient will be initiated on antibiotics Rocephin and Zithromax to be exact.  Follow-up blood cultures.  Monitor closely.  #2 hematuria: Suspected cystitis.  Patient.  Limitation could be reflective of bladder infection.  Patient on Xarelto.  However Xarelto for injury.  Continues Rocephin.  Once bleeding stops may restart his anticoagulation.  #3 history of bladder cancer: Patient has a urostomy bag in place.  Continue monitoring  #4 hyperlipidemia: Will resume statin  #5 essential hypertension: Patient on diltiazem and metoprolol.  We will continue  #6 paroxysmal atrial fibrillation: Continue with metoprolol and Cardizem.  Hold Xarelto temporarily.    Advance Care Planning:   Code Status: Full Code   Consults: None  Family Communication: No family at  bedside  Severity of Illness: The appropriate patient status for this patient is INPATIENT. Inpatient status is judged to be reasonable and necessary in order to provide the required intensity of service to ensure the patient's safety. The patient's presenting symptoms, physical exam findings, and initial radiographic and laboratory data in the context of their chronic comorbidities is felt to place them at high risk for further clinical deterioration. Furthermore, it is not anticipated that the patient will be medically stable for discharge from the hospital within 2 midnights of admission.   * I certify that at the point of admission it is my clinical judgment that the patient will require inpatient hospital care spanning beyond 2 midnights from the point of admission due to high intensity of service, high risk for further deterioration and high frequency of surveillance required.*  AuthorLonia Blood, MD 10/27/2023 9:04 PM  For on call review [email or web address].

## 2023-10-27 NOTE — ED Triage Notes (Signed)
 Patient presents due to blood in his urine. Family reports what she believes to be 2 kidney stone in his urostomy bag. They also report for 3 days he has had a productive cough, weakness, back pain, SOB and chest congestion.

## 2023-10-28 DIAGNOSIS — J189 Pneumonia, unspecified organism: Secondary | ICD-10-CM | POA: Diagnosis not present

## 2023-10-28 LAB — CBC
HCT: 50.5 % (ref 39.0–52.0)
Hemoglobin: 15.9 g/dL (ref 13.0–17.0)
MCH: 32.9 pg (ref 26.0–34.0)
MCHC: 31.5 g/dL (ref 30.0–36.0)
MCV: 104.6 fL — ABNORMAL HIGH (ref 80.0–100.0)
Platelets: 156 10*3/uL (ref 150–400)
RBC: 4.83 MIL/uL (ref 4.22–5.81)
RDW: 14.2 % (ref 11.5–15.5)
WBC: 6.8 10*3/uL (ref 4.0–10.5)
nRBC: 0 % (ref 0.0–0.2)

## 2023-10-28 LAB — COMPREHENSIVE METABOLIC PANEL WITH GFR
ALT: 14 U/L (ref 0–44)
AST: 21 U/L (ref 15–41)
Albumin: 3.3 g/dL — ABNORMAL LOW (ref 3.5–5.0)
Alkaline Phosphatase: 85 U/L (ref 38–126)
Anion gap: 10 (ref 5–15)
BUN: 28 mg/dL — ABNORMAL HIGH (ref 8–23)
CO2: 24 mmol/L (ref 22–32)
Calcium: 8.7 mg/dL — ABNORMAL LOW (ref 8.9–10.3)
Chloride: 105 mmol/L (ref 98–111)
Creatinine, Ser: 1.32 mg/dL — ABNORMAL HIGH (ref 0.61–1.24)
GFR, Estimated: 57 mL/min — ABNORMAL LOW (ref >60)
Glucose, Bld: 180 mg/dL — ABNORMAL HIGH (ref 70–99)
Potassium: 4.7 mmol/L (ref 3.5–5.1)
Sodium: 139 mmol/L (ref 135–145)
Total Bilirubin: 0.8 mg/dL (ref 0.0–1.2)
Total Protein: 7.4 g/dL (ref 6.5–8.1)

## 2023-10-28 LAB — URINE CULTURE: Special Requests: NORMAL

## 2023-10-28 LAB — HIV ANTIBODY (ROUTINE TESTING W REFLEX): HIV Screen 4th Generation wRfx: NONREACTIVE

## 2023-10-28 MED ORDER — DILTIAZEM HCL ER COATED BEADS 240 MG PO CP24
240.0000 mg | ORAL_CAPSULE | Freq: Every day | ORAL | Status: DC
  Administered 2023-10-28 – 2023-11-01 (×5): 240 mg via ORAL
  Filled 2023-10-28 (×5): qty 1

## 2023-10-28 MED ORDER — AZITHROMYCIN 250 MG PO TABS
500.0000 mg | ORAL_TABLET | Freq: Every day | ORAL | Status: AC
  Administered 2023-10-28 – 2023-10-31 (×4): 500 mg via ORAL
  Filled 2023-10-28 (×4): qty 2

## 2023-10-28 MED ORDER — RIVAROXABAN 20 MG PO TABS
20.0000 mg | ORAL_TABLET | Freq: Every day | ORAL | Status: DC
  Administered 2023-10-28 – 2023-10-31 (×4): 20 mg via ORAL
  Filled 2023-10-28 (×4): qty 1

## 2023-10-28 NOTE — Plan of Care (Deleted)
  Problem: Nutrition: Goal: Adequate nutrition will be maintained 10/28/2023 2338 by Rubie Maid, RN Outcome: Progressing 10/28/2023 2331 by Rubie Maid, RN Outcome: Progressing   Problem: Clinical Measurements: Goal: Respiratory complications will improve 10/28/2023 2338 by Rubie Maid, RN Outcome: Progressing 10/28/2023 2331 by Rubie Maid, RN Outcome: Progressing   Problem: Safety: Goal: Ability to remain free from injury will improve 10/28/2023 2338 by Rubie Maid, RN Outcome: Progressing 10/28/2023 2331 by Rubie Maid, RN Outcome: Progressing

## 2023-10-28 NOTE — Plan of Care (Signed)

## 2023-10-28 NOTE — Progress Notes (Signed)
 PROGRESS NOTE    Zachary Shannon.  WUJ:[id number] DOB: March 31, 1951 DOA: 10/27/2023 PCP: Jarvis Morgan, DO   Brief Narrative:  73 y.o. male with medical history significant of bladder cancer, diastolic dysfunction CHF, chronic kidney disease stage III, coronary artery disease, history of aortic insufficiency, atrial fibrillation on chronic anticoagulation, hyperlipidemia and recurrent UTIs presented with shortness of breath and cough along with hematuria.  On presentation, chest x-ray showed possible pneumonia; UA was consistent with UTI.  He required supplemental oxygen because of oxygen saturations of 89% on room air.  He was started on IV antibiotics.  Assessment & Plan:   Acute respiratory failure with hypoxia Right lower lobe bacterial community-acquired pneumonia - Currently on 2 L oxygen via nasal cannula.  Wean off as able.  Imaging suggested right lower lobe pneumonia.  Continue Rocephin and Zithromax.  Hematuria due to UTI/acute cystitis: Present on admission - Patient has history of recurrent UTI.  Xarelto on hold because of hematuria. - Continue Rocephin.  Follow urine cultures  History of bladder cancer - Has urostomy bag in place.  Outpatient follow-up with urology  Hyperlipidemia Resume statin on discharge  Hypertension - Monitor blood pressure.  Continue metoprolol.  Might have to resume diltiazem as well.    Paroxysmal A-fib - Mild intermittent tachycardia present.  Xarelto on hold.  Metoprolol and diltiazem plan as above.  Outpatient follow-up with cardiology  CKD stage IIIa - Creatinine currently stable.  Obesity class I - Outpatient follow-up  Macrocytosis - Hemoglobin stable.   DVT prophylaxis: Xarelto on hold Code Status:  Full Family Communication: None at bedside Disposition Plan: Status is: Inpatient Remains inpatient appropriate because: Of severity of illness    Consultants: None  Procedures: None  Antimicrobials: Rocephin and  Zithromax from 10/27/2023 onwards   Subjective: Patient seen and examined at bedside.  Still short of breath with exertion and coughing.  Feels slightly better.  No fevers or vomiting reported.  Objective: Vitals:   10/27/23 2132 10/28/23 0019 10/28/23 0203 10/28/23 0451  BP: (!) 154/87  139/82 131/75  Pulse: (!) 103 (!) 106 (!) 103 95  Resp: 18  20 19   Temp: 98.4 F (36.9 C)  98.6 F (37 C) 98.2 F (36.8 C)  TempSrc:      SpO2: 94%  95% 95%  Weight:      Height:        Intake/Output Summary (Last 24 hours) at 10/28/2023 0713 Last data filed at 10/27/2023 2147 Gross per 24 hour  Intake --  Output 250 ml  Net -250 ml   Filed Weights   10/27/23 2026  Weight: 108.9 kg    Examination:  General exam: Appears calm and comfortable.  On 2 L oxygen via nasal cannula.  Looks chronically in the condition. Respiratory system: Bilateral decreased breath sounds at bases with scattered crackles Cardiovascular system: S1 & S2 heard, intermittently tachycardic  gastrointestinal system: Abdomen is obese, nondistended, soft and nontender. Normal bowel sounds heard. Genitourinary: Urostomy bag present Extremities: No cyanosis, clubbing, edema  Central nervous system: Alert and oriented. No focal neurological deficits. Moving extremities Skin: No rashes, lesions or ulcers Psychiatry: Flat affect.  Not agitated.    Data Reviewed: I have personally reviewed following labs and imaging studies  CBC: Recent Labs  Lab 10/27/23 1804 10/28/23 0528  WBC 9.6 6.8  NEUTROABS 8.1*  --   HGB 17.3* 15.9  HCT 53.2* 50.5  MCV 101.9* 104.6*  PLT 166 156   Basic Metabolic Panel:  Recent Labs  Lab 10/27/23 1804 10/28/23 0528  NA 140 139  K 4.4 4.7  CL 103 105  CO2 27 24  GLUCOSE 142* 180*  BUN 26* 28*  CREATININE 1.52* 1.32*  CALCIUM 9.1 8.7*   GFR: Estimated Creatinine Clearance: 63.5 mL/min (A) (by C-G formula based on SCr of 1.32 mg/dL (H)). Liver Function Tests: Recent Labs  Lab  10/27/23 1804 10/28/23 0528  AST 28 21  ALT 16 14  ALKPHOS 100 85  BILITOT 1.6* 0.8  PROT 8.3* 7.4  ALBUMIN 4.0 3.3*   No results for input(s): "LIPASE", "AMYLASE" in the last 168 hours. No results for input(s): "AMMONIA" in the last 168 hours. Coagulation Profile: No results for input(s): "INR", "PROTIME" in the last 168 hours. Cardiac Enzymes: No results for input(s): "CKTOTAL", "CKMB", "CKMBINDEX", "TROPONINI" in the last 168 hours. BNP (last 3 results) No results for input(s): "PROBNP" in the last 8760 hours. HbA1C: No results for input(s): "HGBA1C" in the last 72 hours. CBG: No results for input(s): "GLUCAP" in the last 168 hours. Lipid Profile: No results for input(s): "CHOL", "HDL", "LDLCALC", "TRIG", "CHOLHDL", "LDLDIRECT" in the last 72 hours. Thyroid Function Tests: No results for input(s): "TSH", "T4TOTAL", "FREET4", "T3FREE", "THYROIDAB" in the last 72 hours. Anemia Panel: No results for input(s): "VITAMINB12", "FOLATE", "FERRITIN", "TIBC", "IRON", "RETICCTPCT" in the last 72 hours. Sepsis Labs: No results for input(s): "PROCALCITON", "LATICACIDVEN" in the last 168 hours.  No results found for this or any previous visit (from the past 240 hours).       Radiology Studies: CT ABDOMEN PELVIS W CONTRAST Result Date: 10/27/2023 CLINICAL DATA:  Hematuria, gross/macroscopic hx of bladder cancer EXAM: CT ABDOMEN AND PELVIS WITH CONTRAST TECHNIQUE: Multidetector CT imaging of the abdomen and pelvis was performed using the standard protocol following bolus administration of intravenous contrast. RADIATION DOSE REDUCTION: This exam was performed according to the departmental dose-optimization program which includes automated exposure control, adjustment of the mA and/or kV according to patient size and/or use of iterative reconstruction technique. CONTRAST:  OMNIPAQUE IOHEXOL 300 MG/ML  SOLN COMPARISON:  CT 12/27/2022 FINDINGS: Lower chest: No basilar airspace disease or  pleural effusion. There are coronary artery calcifications. Hepatobiliary: Mild diffuse hepatic steatosis. No focal liver abnormality. Layering gallstones or sludge in the gallbladder. No pericholecystic inflammation. No biliary dilatation. Pancreas: No ductal dilatation or inflammation. Spleen: Normal in size without focal abnormality. Adrenals/Urinary Tract: No adrenal nodule. No hydronephrosis or renal inflammation. No urolithiasis. Cortical scarring in the lower right kidney. Suspicious renal abnormality. Cystectomy with ileal conduit. The ileal conduit is nondilated, no evidence of inflammation. Stomach/Bowel: Unremarkable appearance of the stomach. Small duodenal diverticulum. Enteric sutures within small bowel in the pelvis. No obstruction or inflammation. Low lying cecum. Moderate colonic stool burden. Colonic diverticulosis, most prominent in the sigmoid. No diverticulitis or acute colonic inflammation. The appendix is not visualized. Vascular/Lymphatic: Aortic and branch atherosclerosis. No aortic aneurysm. The portal vein is patent. No enlarged lymph nodes in the abdomen or pelvis. Reproductive: Prostate is absent. Other: No free air or ascites. Small fat containing umbilical and supraumbilical fat containing ventral abdominal hernias. Fat in the left inguinal canal. Musculoskeletal: No acute osseous findings. Left greater than right hip osteoarthritis. Degenerative change in the spine. IMPRESSION: 1. Cystectomy with ileal conduit. No urolithiasis or explanation for hematuria. 2. Colonic diverticulosis without diverticulitis. 3. Cholelithiasis or gallbladder sludge. 4. Hepatic steatosis. Aortic Atherosclerosis (ICD10-I70.0). Electronically Signed   By: Narda Rutherford M.D.   On: 10/27/2023 20:04  DG Chest 2 View Result Date: 10/27/2023 CLINICAL DATA:  Dyspnea EXAM: CHEST - 2 VIEW COMPARISON:  12/27/2022 FINDINGS: Focal opacity within the right lower lobe is in keeping with a focal pneumonic infiltrate  in the appropriate clinical setting. No pneumothorax or pleural effusion. Cardiac size within normal limits. Osseous structures are age-appropriate. IMPRESSION: 1. Right lower lobe pneumonia. Follow-up chest radiograph is recommended in 3-4 weeks, following conservative therapy, to document resolution. If persistent at that time, dedicated contrast enhanced CT imaging of the chest is recommended for further evaluation. Electronically Signed   By: Helyn Numbers M.D.   On: 10/27/2023 19:12        Scheduled Meds:  ipratropium  0.5 mg Nebulization TID   levalbuterol  0.63 mg Nebulization TID   metoprolol tartrate  25 mg Oral BID   Continuous Infusions:  sodium chloride 100 mL/hr at 10/27/23 2302   azithromycin 500 mg (10/28/23 0022)   cefTRIAXone (ROCEPHIN)  IV            Glade Lloyd, MD [facility] 10/28/2023, 7:13 AM

## 2023-10-28 NOTE — Plan of Care (Signed)
  Problem: Clinical Measurements: Goal: Respiratory complications will improve Outcome: Progressing   Problem: Clinical Measurements: Goal: Cardiovascular complication will be avoided Outcome: Progressing   Problem: Nutrition: Goal: Adequate nutrition will be maintained Outcome: Progressing   Problem: Safety: Goal: Ability to remain free from injury will improve Outcome: Progressing   

## 2023-10-29 DIAGNOSIS — J189 Pneumonia, unspecified organism: Secondary | ICD-10-CM | POA: Diagnosis not present

## 2023-10-29 LAB — BASIC METABOLIC PANEL WITH GFR
Anion gap: 8 (ref 5–15)
BUN: 33 mg/dL — ABNORMAL HIGH (ref 8–23)
CO2: 24 mmol/L (ref 22–32)
Calcium: 8.6 mg/dL — ABNORMAL LOW (ref 8.9–10.3)
Chloride: 110 mmol/L (ref 98–111)
Creatinine, Ser: 1.21 mg/dL (ref 0.61–1.24)
GFR, Estimated: >60 mL/min (ref >60)
Glucose, Bld: 101 mg/dL — ABNORMAL HIGH (ref 70–99)
Potassium: 4.3 mmol/L (ref 3.5–5.1)
Sodium: 142 mmol/L (ref 135–145)

## 2023-10-29 LAB — CBC WITH DIFFERENTIAL/PLATELET
Abs Immature Granulocytes: 0.12 10*3/uL — ABNORMAL HIGH (ref 0.00–0.07)
Basophils Absolute: 0 10*3/uL (ref 0.0–0.1)
Basophils Relative: 0 %
Eosinophils Absolute: 0 10*3/uL (ref 0.0–0.5)
Eosinophils Relative: 0 %
HCT: 46.9 % (ref 39.0–52.0)
Hemoglobin: 14.4 g/dL (ref 13.0–17.0)
Immature Granulocytes: 1 %
Lymphocytes Relative: 6 %
Lymphs Abs: 1 10*3/uL (ref 0.7–4.0)
MCH: 32.6 pg (ref 26.0–34.0)
MCHC: 30.7 g/dL (ref 30.0–36.0)
MCV: 106.1 fL — ABNORMAL HIGH (ref 80.0–100.0)
Monocytes Absolute: 0.9 10*3/uL (ref 0.1–1.0)
Monocytes Relative: 6 %
Neutro Abs: 14.4 10*3/uL — ABNORMAL HIGH (ref 1.7–7.7)
Neutrophils Relative %: 87 %
Platelets: 176 10*3/uL (ref 150–400)
RBC: 4.42 MIL/uL (ref 4.22–5.81)
RDW: 14.4 % (ref 11.5–15.5)
WBC: 16.5 10*3/uL — ABNORMAL HIGH (ref 4.0–10.5)
nRBC: 0 % (ref 0.0–0.2)

## 2023-10-29 LAB — MAGNESIUM: Magnesium: 2.2 mg/dL (ref 1.7–2.4)

## 2023-10-29 NOTE — Plan of Care (Signed)
  Problem: Safety: Goal: Ability to remain free from injury will improve Outcome: Progressing   Problem: Nutrition: Goal: Adequate nutrition will be maintained Outcome: Progressing   Problem: Clinical Measurements: Goal: Respiratory complications will improve Outcome: Progressing

## 2023-10-29 NOTE — Plan of Care (Signed)
  Problem: Health Behavior/Discharge Planning: Goal: Ability to manage health-related needs will improve Outcome: Progressing   Problem: Clinical Measurements: Goal: Ability to maintain clinical measurements within normal limits will improve Outcome: Progressing Goal: Will remain free from infection Outcome: Progressing Goal: Diagnostic test results will improve Outcome: Progressing Goal: Respiratory complications will improve Outcome: Progressing Goal: Cardiovascular complication will be avoided Outcome: Progressing   Problem: Activity: Goal: Risk for activity intolerance will decrease Outcome: Progressing   Problem: Coping: Goal: Level of anxiety will decrease Outcome: Progressing

## 2023-10-29 NOTE — Progress Notes (Signed)
 PROGRESS NOTE    Haskell Flirt.  NWG:[id number] DOB: [birth year] DOA: 10/27/2023 PCP: Jarvis Morgan, DO   Brief Narrative:  73 y.o. male with medical history significant of bladder cancer, diastolic dysfunction CHF, chronic kidney disease stage III, coronary artery disease, history of aortic insufficiency, atrial fibrillation on chronic anticoagulation, hyperlipidemia and recurrent UTIs presented with shortness of breath and cough along with hematuria.  On presentation, chest x-ray showed possible pneumonia; UA was consistent with UTI.  He required supplemental oxygen because of oxygen saturations of 89% on room air.  He was started on IV antibiotics.  Assessment & Plan:   Acute respiratory failure with hypoxia Right lower lobe bacterial community-acquired pneumonia - Currently still on 2 L oxygen via nasal cannula.  Wean off as able.  Imaging suggested right lower lobe pneumonia.  Continue Rocephin and Zithromax.  Hematuria due to UTI/acute cystitis: Present on admission - Patient has history of recurrent UTI.  No further hematuria since admission.  Xarelto has been resumed.- Continue Rocephin.  Urine cultures grew multiple species.  History of bladder cancer - Has urostomy bag in place.  Outpatient follow-up with urology  Hyperlipidemia -Resume statin on discharge  Hypertension - Monitor blood pressure.  Continue metoprolol and Cardizem  Paroxysmal A-fib - Currently rate controlled.  Xarelto on hold.  Metoprolol and diltiazem plan as above.  Outpatient follow-up with cardiology  CKD stage IIIa - Creatinine currently stable.  Obesity class I - Outpatient follow-up  Macrocytosis - Hemoglobin stable.   DVT prophylaxis: Xarelto on hold Code Status:  Full Family Communication: None at bedside Disposition Plan: Status is: Inpatient Remains inpatient appropriate because: Of severity of illness    Consultants: None  Procedures: None  Antimicrobials: Rocephin  and Zithromax from 10/27/2023 onwards   Subjective: Patient seen and examined at bedside.  Still complains of intermittent cough with shortness of breath.  No fever, vomiting, chest pain reported.  Feels slightly better.   Objective: Vitals:   10/28/23 2126 10/28/23 2233 10/29/23 0500 10/29/23 0519  BP: 130/79 117/74  130/79  Pulse: (!) 104 (!) 104  75  Resp:  20  18  Temp:    98.1 F (36.7 C)  TempSrc:    Oral  SpO2:  98%  94%  Weight:   108.7 kg   Height:        Intake/Output Summary (Last 24 hours) at 10/29/2023 0658 Last data filed at 10/29/2023 0529 Gross per 24 hour  Intake 2242.79 ml  Output 1850 ml  Net 392.79 ml   Filed Weights   10/27/23 2026 10/29/23 0500  Weight: 108.9 kg 108.7 kg    Examination:  General: Remains on 2 L oxygen via nasal cannula.  No distress.  Chronically ill and deconditioned looking. ENT/neck: No thyromegaly.  JVD is not elevated  respiratory: Decreased breath sounds at bases bilaterally with some crackles; no wheezing  CVS: S1-S2 heard, rate controlled currently Abdominal: Soft, obese, nontender, slightly distended; no organomegaly, bowel sounds are heard Genitourinary: Urostomy bag is present Extremities: Trace lower extremity edema; no cyanosis  CNS: Awake and alert.  No focal neurologic deficit.  Moves extremities Lymph: No obvious lymphadenopathy Skin: No obvious ecchymosis/lesions  psych: Mostly flat affect.  Not agitated currently.  Musculoskeletal: No obvious joint swelling/deformity     Data Reviewed: I have personally reviewed following labs and imaging studies  CBC: Recent Labs  Lab 10/27/23 1804 10/28/23 0528 10/29/23 0523  WBC 9.6 6.8 16.5*  NEUTROABS 8.1*  --  14.4*  HGB 17.3* 15.9 14.4  HCT 53.2* 50.5 46.9  MCV 101.9* 104.6* 106.1*  PLT 166 156 176   Basic Metabolic Panel: Recent Labs  Lab 10/27/23 1804 10/28/23 0528 10/29/23 0523  NA 140 139 142  K 4.4 4.7 4.3  CL 103 105 110  CO2 27 24 24   GLUCOSE 142*  180* 101*  BUN 26* 28* 33*  CREATININE 1.52* 1.32* 1.21  CALCIUM 9.1 8.7* 8.6*  MG  --   --  2.2   GFR: Estimated Creatinine Clearance: 69.2 mL/min (by C-G formula based on SCr of 1.21 mg/dL). Liver Function Tests: Recent Labs  Lab 10/27/23 1804 10/28/23 0528  AST 28 21  ALT 16 14  ALKPHOS 100 85  BILITOT 1.6* 0.8  PROT 8.3* 7.4  ALBUMIN 4.0 3.3*   No results for input(s): "LIPASE", "AMYLASE" in the last 168 hours. No results for input(s): "AMMONIA" in the last 168 hours. Coagulation Profile: No results for input(s): "INR", "PROTIME" in the last 168 hours. Cardiac Enzymes: No results for input(s): "CKTOTAL", "CKMB", "CKMBINDEX", "TROPONINI" in the last 168 hours. BNP (last 3 results) No results for input(s): "PROBNP" in the last 8760 hours. HbA1C: No results for input(s): "HGBA1C" in the last 72 hours. CBG: No results for input(s): "GLUCAP" in the last 168 hours. Lipid Profile: No results for input(s): "CHOL", "HDL", "LDLCALC", "TRIG", "CHOLHDL", "LDLDIRECT" in the last 72 hours. Thyroid Function Tests: No results for input(s): "TSH", "T4TOTAL", "FREET4", "T3FREE", "THYROIDAB" in the last 72 hours. Anemia Panel: No results for input(s): "VITAMINB12", "FOLATE", "FERRITIN", "TIBC", "IRON", "RETICCTPCT" in the last 72 hours. Sepsis Labs: No results for input(s): "PROCALCITON", "LATICACIDVEN" in the last 168 hours.  Recent Results (from the past 240 hours)  Urine Culture     Status: Abnormal   Collection Time: 10/27/23  6:03 PM   Specimen: Urine, Clean Catch  Result Value Ref Range Status   Specimen Description   Final    URINE, CLEAN CATCH Performed at [facility], [address] [place], [place] [address]    Special Requests   Final    Normal Performed at [facility], [address] [place], [place] [address]    Culture MULTIPLE SPECIES PRESENT, SUGGEST RECOLLECTION (A)  Final   Report Status 10/28/2023 FINAL  Final          Radiology Studies: CT ABDOMEN PELVIS W CONTRAST Result Date: 10/27/2023 CLINICAL DATA:  Hematuria, gross/macroscopic hx of bladder cancer EXAM: CT ABDOMEN AND PELVIS WITH CONTRAST TECHNIQUE: Multidetector CT imaging of the abdomen and pelvis was performed using the standard protocol following bolus administration of intravenous contrast. RADIATION DOSE REDUCTION: This exam was performed according to the departmental dose-optimization program which includes automated exposure control, adjustment of the mA and/or kV according to patient size and/or use of iterative reconstruction technique. CONTRAST:  OMNIPAQUE IOHEXOL 300 MG/ML  SOLN COMPARISON:  CT 12/27/2022 FINDINGS: Lower chest: No basilar airspace disease or pleural effusion. There are coronary artery calcifications. Hepatobiliary: Mild diffuse hepatic steatosis. No focal liver abnormality. Layering gallstones or sludge in the gallbladder. No pericholecystic inflammation. No biliary dilatation. Pancreas: No ductal dilatation or inflammation. Spleen: Normal in size without focal abnormality. Adrenals/Urinary Tract: No adrenal nodule. No hydronephrosis or renal inflammation. No urolithiasis. Cortical scarring in the lower right kidney. Suspicious renal abnormality. Cystectomy with ileal conduit. The ileal conduit is nondilated, no evidence of inflammation. Stomach/Bowel: Unremarkable appearance of the stomach. Small duodenal diverticulum. Enteric sutures within small bowel in  the pelvis. No obstruction or inflammation. Low lying cecum. Moderate colonic stool burden. Colonic diverticulosis, most prominent in the sigmoid. No diverticulitis or acute colonic inflammation. The appendix is not visualized. Vascular/Lymphatic: Aortic and branch atherosclerosis. No aortic aneurysm. The portal vein is patent. No enlarged lymph nodes in the abdomen or pelvis. Reproductive: Prostate is absent. Other: No free air or ascites. Small fat containing umbilical  and supraumbilical fat containing ventral abdominal hernias. Fat in the left inguinal canal. Musculoskeletal: No acute osseous findings. Left greater than right hip osteoarthritis. Degenerative change in the spine. IMPRESSION: 1. Cystectomy with ileal conduit. No urolithiasis or explanation for hematuria. 2. Colonic diverticulosis without diverticulitis. 3. Cholelithiasis or gallbladder sludge. 4. Hepatic steatosis. Aortic Atherosclerosis (ICD10-I70.0). Electronically Signed   By: Narda Rutherford M.D.   On: 10/27/2023 20:04   DG Chest 2 View Result Date: 10/27/2023 CLINICAL DATA:  Dyspnea EXAM: CHEST - 2 VIEW COMPARISON:  12/27/2022 FINDINGS: Focal opacity within the right lower lobe is in keeping with a focal pneumonic infiltrate in the appropriate clinical setting. No pneumothorax or pleural effusion. Cardiac size within normal limits. Osseous structures are age-appropriate. IMPRESSION: 1. Right lower lobe pneumonia. Follow-up chest radiograph is recommended in 3-4 weeks, following conservative therapy, to document resolution. If persistent at that time, dedicated contrast enhanced CT imaging of the chest is recommended for further evaluation. Electronically Signed   By: Helyn Numbers M.D.   On: 10/27/2023 19:12        Scheduled Meds:  azithromycin  500 mg Oral QHS   diltiazem  240 mg Oral Daily   ipratropium  0.5 mg Nebulization TID   levalbuterol  0.63 mg Nebulization TID   metoprolol tartrate  25 mg Oral BID   rivaroxaban  20 mg Oral Q supper   Continuous Infusions:  sodium chloride 75 mL/hr at 10/28/23 2236   cefTRIAXone (ROCEPHIN)  IV 2 g (10/28/23 0830)          Glade Lloyd, MD [facility] 10/29/2023, 6:58 AM

## 2023-10-30 DIAGNOSIS — J189 Pneumonia, unspecified organism: Secondary | ICD-10-CM | POA: Diagnosis not present

## 2023-10-30 LAB — CBC WITH DIFFERENTIAL/PLATELET
Abs Immature Granulocytes: 0.16 10*3/uL — ABNORMAL HIGH (ref 0.00–0.07)
Basophils Absolute: 0.1 10*3/uL (ref 0.0–0.1)
Basophils Relative: 1 %
Eosinophils Absolute: 0 10*3/uL (ref 0.0–0.5)
Eosinophils Relative: 0 %
HCT: 47.3 % (ref 39.0–52.0)
Hemoglobin: 14.9 g/dL (ref 13.0–17.0)
Immature Granulocytes: 2 %
Lymphocytes Relative: 10 %
Lymphs Abs: 1 10*3/uL (ref 0.7–4.0)
MCH: 33.1 pg (ref 26.0–34.0)
MCHC: 31.5 g/dL (ref 30.0–36.0)
MCV: 105.1 fL — ABNORMAL HIGH (ref 80.0–100.0)
Monocytes Absolute: 0.7 10*3/uL (ref 0.1–1.0)
Monocytes Relative: 6 %
Neutro Abs: 8.4 10*3/uL — ABNORMAL HIGH (ref 1.7–7.7)
Neutrophils Relative %: 81 %
Platelets: 176 10*3/uL (ref 150–400)
RBC: 4.5 MIL/uL (ref 4.22–5.81)
RDW: 14.4 % (ref 11.5–15.5)
WBC: 10.3 10*3/uL (ref 4.0–10.5)
nRBC: 0 % (ref 0.0–0.2)

## 2023-10-30 LAB — BASIC METABOLIC PANEL WITH GFR
Anion gap: 8 (ref 5–15)
BUN: 25 mg/dL — ABNORMAL HIGH (ref 8–23)
CO2: 24 mmol/L (ref 22–32)
Calcium: 8.5 mg/dL — ABNORMAL LOW (ref 8.9–10.3)
Chloride: 107 mmol/L (ref 98–111)
Creatinine, Ser: 1.08 mg/dL (ref 0.61–1.24)
GFR, Estimated: >60 mL/min (ref >60)
Glucose, Bld: 81 mg/dL (ref 70–99)
Potassium: 3.9 mmol/L (ref 3.5–5.1)
Sodium: 139 mmol/L (ref 135–145)

## 2023-10-30 LAB — MAGNESIUM: Magnesium: 2 mg/dL (ref 1.7–2.4)

## 2023-10-30 NOTE — Evaluation (Signed)
 Physical Therapy Evaluation Patient Details Name: Zachary Shannon. MRN: [id number] DOB: Jul 29, 1950 Today's Date: 10/30/2023  History of Present Illness  Pt is a 73 y/o M admitted on 10/27/23 after presenting with c/o SOB & cough, as well as hematuria. Chest x-ray showed possible PNA, UA consistent with UTI. PMH: bladder CA, diastolic dysfunction CHF, CKD 3, CAD, aortic insufficiency, a-fib on chronic anticoagulation, HLD, recurrent UTIs  Clinical Impression  Pt seen for PT evaluation with pt agreeable to tx. Pt reports prior to admission he was independent without AD, still driving, denies falls, living alone. On this date, pt is able to complete STS with independence & ambulate without AD or pushing IV pole with supervision fade to mod I, no overt LOB during mobility. Pt does require 1 prolonged standing rest break to recover SpO2. Pt denies feeling SOB throughout session, PT educates pt on pursed lip breathing & briefly reviews incentive spirometer with pt reporting comfort with using device. At this time, pt does not require PT services; pt can continue to mobilize with nursing or mobility specialists. PT to complete current orders; please re-consult if new needs arise.  Pt received & left on 1L/min, SpO2 >/= 90% Pt attempted to wean to room air, gait on room air, SpO2 as low as 83% with prolonged standing rest break & pursed lip breathing to recover to 88%. Pt denied SOB throughout session.        If plan is discharge home, recommend the following:     Can travel by private vehicle        Equipment Recommendations None recommended by PT  Recommendations for Other Services       Functional Status Assessment Patient has not had a recent decline in their functional status     Precautions / Restrictions Precautions Precautions: None Restrictions Weight Bearing Restrictions Per Provider Order: No      Mobility  Bed Mobility               General bed mobility comments:  not tested, pt received & left sitting in recliner    Transfers Overall transfer level: Independent Equipment used: None               General transfer comment: STS from recliner without AD    Ambulation/Gait Ambulation/Gait assistance: Supervision, Modified independent (Device/Increase time) Gait Distance (Feet): 150 Feet Assistive device: IV Pole, None Gait Pattern/deviations: Decreased step length - right, Decreased stride length, Step-through pattern, Decreased step length - left Gait velocity: slightly decreased     General Gait Details: Pt ambulates to opposite end of hallway & back without AD or pushing IV pole with supervision fade to mod I, no overt LOB. Prolonged standing rest break to recover SpO2.  Stairs            Wheelchair Mobility     Tilt Bed    Modified Rankin (Stroke Patients Only)       Balance Overall balance assessment: Needs assistance Sitting-balance support: Feet supported Sitting balance-Leahy Scale: Good     Standing balance support: During functional activity, No upper extremity supported Standing balance-Leahy Scale: Good                               Pertinent Vitals/Pain Pain Assessment Pain Assessment: No/denies pain    Home Living Family/patient expects to be discharged to:: Private residence Living Arrangements: Alone   Type of Home: House Home Access: Stairs  to enter   Entrance Stairs-Number of Steps: 1   Home Layout: One level Home Equipment: Agricultural consultant (2 wheels);Cane - single point      Prior Function Prior Level of Function : Independent/Modified Independent;Driving             Mobility Comments: denies falls       Extremity/Trunk Assessment   Upper Extremity Assessment Upper Extremity Assessment: Overall WFL for tasks assessed    Lower Extremity Assessment Lower Extremity Assessment: Overall WFL for tasks assessed       Communication   Communication Communication: No  apparent difficulties    Cognition Arousal: Alert Behavior During Therapy: WFL for tasks assessed/performed   PT - Cognitive impairments: No apparent impairments                         Following commands: Intact       Cueing Cueing Techniques: Verbal cues     General Comments General comments (skin integrity, edema, etc.): Pt received & left on 1L/min via nasal cannula.    Exercises     Assessment/Plan    PT Assessment Patient does not need any further PT services  PT Problem List         PT Treatment Interventions      PT Goals (Current goals can be found in the Care Plan section)  Acute Rehab PT Goals Patient Stated Goal: go home PT Goal Formulation: With patient Time For Goal Achievement: 11/13/23 Potential to Achieve Goals: Good    Frequency       Co-evaluation               AM-PAC PT "6 Clicks" Mobility  Outcome Measure Help needed turning from your back to your side while in a flat bed without using bedrails?: None Help needed moving from lying on your back to sitting on the side of a flat bed without using bedrails?: None Help needed moving to and from a bed to a chair (including a wheelchair)?: None Help needed standing up from a chair using your arms (e.g., wheelchair or bedside chair)?: None Help needed to walk in hospital room?: None Help needed climbing 3-5 steps with a railing? : None 6 Click Score: 24    End of Session Equipment Utilized During Treatment: Oxygen Activity Tolerance: Patient tolerated treatment well Patient left: in chair;with call bell/phone within reach Nurse Communication:  (O2)      Time: 1610-9604 PT Time Calculation (min) (ACUTE ONLY): 16 min   Charges:   PT Evaluation $PT Eval Moderate Complexity: 1 Mod   PT General Charges $$ ACUTE PT VISIT: 1 Visit         Aleda Grana, PT, DPT 10/30/23, 10:10 AM   Sandi Mariscal 10/30/2023, 10:08 AM

## 2023-10-30 NOTE — Progress Notes (Signed)
 PROGRESS NOTE    Haskell Flirt.  ZOX:[id number] DOB: 02-17-1951 DOA: 10/27/2023 PCP: Jarvis Morgan, DO   Brief Narrative:  73 y.o. male with medical history significant of bladder cancer, diastolic dysfunction CHF, chronic kidney disease stage III, coronary artery disease, history of aortic insufficiency, atrial fibrillation on chronic anticoagulation, hyperlipidemia and recurrent UTIs presented with shortness of breath and cough along with hematuria.  On presentation, chest x-ray showed possible pneumonia; UA was consistent with UTI.  He required supplemental oxygen because of oxygen saturations of 89% on room air.  He was started on IV antibiotics.  Assessment & Plan:   Acute respiratory failure with hypoxia Right lower lobe bacterial community-acquired pneumonia - Currently still on 1 L oxygen via nasal cannula.  Wean off as able.  Imaging suggested right lower lobe pneumonia.  Continue Rocephin and Zithromax.  Hematuria due to UTI/acute cystitis: Present on admission - Patient has history of recurrent UTI.  No further hematuria since admission.  Xarelto has been resumed. -Continue Rocephin.  Urine cultures grew multiple species.  History of bladder cancer - Has urostomy bag in place.  Outpatient follow-up with urology  Hyperlipidemia -Resume statin on discharge  Hypertension - Monitor blood pressure.  Continue metoprolol and Cardizem  Paroxysmal A-fib - Currently rate controlled.  Xarelto on hold.  Metoprolol and diltiazem plan as above.  Outpatient follow-up with cardiology  CKD stage IIIa - Creatinine currently stable.  Obesity class I - Outpatient follow-up  Macrocytosis - Hemoglobin stable.  Leukocytosis - Resolved   DVT prophylaxis: Xarelto  Code Status:  Full Family Communication: None at bedside Disposition Plan: Status is: Inpatient Remains inpatient appropriate because: Of severity of illness    Consultants: None  Procedures:  None  Antimicrobials: Rocephin and Zithromax from 10/27/2023 onwards   Subjective: Patient seen and examined at bedside.  Continues to feel slightly better but still short of breath with exertion with intermittent cough.  No chest pain, fever or vomiting reported. Objective: Vitals:   10/29/23 2121 10/30/23 0500 10/30/23 0614 10/30/23 0758  BP: 129/86  (!) 138/96   Pulse: 78  92   Resp:   15   Temp:   (!) 97.5 F (36.4 C)   TempSrc:   Oral   SpO2:   95% 96%  Weight:  108.1 kg    Height:        Intake/Output Summary (Last 24 hours) at 10/30/2023 1108 Last data filed at 10/30/2023 0939 Gross per 24 hour  Intake 560 ml  Output 550 ml  Net 10 ml   Filed Weights   10/27/23 2026 10/29/23 0500 10/30/23 0500  Weight: 108.9 kg 108.7 kg 108.1 kg    Examination:  General: On 1 L oxygen via nasal cannula.  No acute distress.  Chronically ill and deconditioned looking. ENT/neck: No palpable neck masses or JVD elevation noted respiratory: Bilateral decreased sounds at bases with scattered crackles  CVS: Rate mostly controlled; S1 and S2 are heard  abdominal: Soft, obese, nontender, distended mildly; no organomegaly, bowel sounds are heard normally Genitourinary: Urostomy bag present Extremities: No clubbing; mild lower extremity edema present CNS: Alert and oriented.  No focal neurologic deficit.  Able to extremities Lymph: No obvious palpable lymphadenopathy Skin: No obvious petechiae/rashes psych: Currently not agitated.  Affect is mostly flat.   Musculoskeletal: No obvious joint tenderness/erythema     Data Reviewed: I have personally reviewed following labs and imaging studies  CBC: Recent Labs  Lab 10/27/23 1804 10/28/23 0528 10/29/23  9562 10/30/23 0541  WBC 9.6 6.8 16.5* 10.3  NEUTROABS 8.1*  --  14.4* 8.4*  HGB 17.3* 15.9 14.4 14.9  HCT 53.2* 50.5 46.9 47.3  MCV 101.9* 104.6* 106.1* 105.1*  PLT 166 156 176 176   Basic Metabolic Panel: Recent Labs  Lab  10/27/23 1804 10/28/23 0528 10/29/23 0523 10/30/23 0541  NA 140 139 142 139  K 4.4 4.7 4.3 3.9  CL 103 105 110 107  CO2 27 24 24 24   GLUCOSE 142* 180* 101* 81  BUN 26* 28* 33* 25*  CREATININE 1.52* 1.32* 1.21 1.08  CALCIUM 9.1 8.7* 8.6* 8.5*  MG  --   --  2.2 2.0   GFR: Estimated Creatinine Clearance: 77.4 mL/min (by C-G formula based on SCr of 1.08 mg/dL). Liver Function Tests: Recent Labs  Lab 10/27/23 1804 10/28/23 0528  AST 28 21  ALT 16 14  ALKPHOS 100 85  BILITOT 1.6* 0.8  PROT 8.3* 7.4  ALBUMIN 4.0 3.3*   No results for input(s): "LIPASE", "AMYLASE" in the last 168 hours. No results for input(s): "AMMONIA" in the last 168 hours. Coagulation Profile: No results for input(s): "INR", "PROTIME" in the last 168 hours. Cardiac Enzymes: No results for input(s): "CKTOTAL", "CKMB", "CKMBINDEX", "TROPONINI" in the last 168 hours. BNP (last 3 results) No results for input(s): "PROBNP" in the last 8760 hours. HbA1C: No results for input(s): "HGBA1C" in the last 72 hours. CBG: No results for input(s): "GLUCAP" in the last 168 hours. Lipid Profile: No results for input(s): "CHOL", "HDL", "LDLCALC", "TRIG", "CHOLHDL", "LDLDIRECT" in the last 72 hours. Thyroid Function Tests: No results for input(s): "TSH", "T4TOTAL", "FREET4", "T3FREE", "THYROIDAB" in the last 72 hours. Anemia Panel: No results for input(s): "VITAMINB12", "FOLATE", "FERRITIN", "TIBC", "IRON", "RETICCTPCT" in the last 72 hours. Sepsis Labs: No results for input(s): "PROCALCITON", "LATICACIDVEN" in the last 168 hours.  Recent Results (from the past 240 hours)  Urine Culture     Status: Abnormal   Collection Time: 10/27/23  6:03 PM   Specimen: Urine, Clean Catch  Result Value Ref Range Status   Specimen Description   Final    URINE, CLEAN CATCH Performed at [facility], [address], [place], [place] [address]    Special Requests   Final    Normal Performed at [facility], [address] [place], [place] [address]    Culture MULTIPLE SPECIES PRESENT, SUGGEST RECOLLECTION (A)  Final   Report Status 10/28/2023 FINAL  Final         Radiology Studies: No results found.       Scheduled Meds:  azithromycin  500 mg Oral QHS   diltiazem  240 mg Oral Daily   ipratropium  0.5 mg Nebulization TID   levalbuterol  0.63 mg Nebulization TID   metoprolol tartrate  25 mg Oral BID   rivaroxaban  20 mg Oral Q supper   Continuous Infusions:  cefTRIAXone (ROCEPHIN)  IV 2 g (10/30/23 0902)          Glade Lloyd, MD [facility] 10/30/2023, 11:08 AM

## 2023-10-30 NOTE — TOC CM/SW Note (Signed)
 Transition of Care [facility]) - Inpatient Brief Assessment   Patient Details  Name: Zachary Shannon. MRN: [id number] Date of Birth: Mar 04, 1951  Transition of Care [facility]) CM/SW Contact:    Larrie Kass, LCSW Phone Number: 10/30/2023, 2:01 PM   Transition of Care Asessment: Insurance and Status: Insurance coverage has been reviewed Patient has primary care physician: Yes Home environment has been reviewed: home with self Prior level of function:: independent Prior/Current Home Services: No current home services Social Drivers of Health Review: SDOH reviewed no interventions necessary Readmission risk has been reviewed: Yes Transition of care needs: no transition of care needs at this time

## 2023-10-31 DIAGNOSIS — J189 Pneumonia, unspecified organism: Secondary | ICD-10-CM | POA: Diagnosis not present

## 2023-10-31 LAB — BASIC METABOLIC PANEL WITH GFR
Anion gap: 9 (ref 5–15)
BUN: 22 mg/dL (ref 8–23)
CO2: 23 mmol/L (ref 22–32)
Calcium: 8.9 mg/dL (ref 8.9–10.3)
Chloride: 102 mmol/L (ref 98–111)
Creatinine, Ser: 1.21 mg/dL (ref 0.61–1.24)
GFR, Estimated: >60 mL/min (ref >60)
Glucose, Bld: 77 mg/dL (ref 70–99)
Potassium: 3.8 mmol/L (ref 3.5–5.1)
Sodium: 134 mmol/L — ABNORMAL LOW (ref 135–145)

## 2023-10-31 LAB — CBC WITH DIFFERENTIAL/PLATELET
Abs Immature Granulocytes: 0.28 10*3/uL — ABNORMAL HIGH (ref 0.00–0.07)
Basophils Absolute: 0 10*3/uL (ref 0.0–0.1)
Basophils Relative: 0 %
Eosinophils Absolute: 0.1 10*3/uL (ref 0.0–0.5)
Eosinophils Relative: 1 %
HCT: 51.2 % (ref 39.0–52.0)
Hemoglobin: 15.9 g/dL (ref 13.0–17.0)
Immature Granulocytes: 4 %
Lymphocytes Relative: 14 %
Lymphs Abs: 1.2 10*3/uL (ref 0.7–4.0)
MCH: 33.2 pg (ref 26.0–34.0)
MCHC: 31.1 g/dL (ref 30.0–36.0)
MCV: 106.9 fL — ABNORMAL HIGH (ref 80.0–100.0)
Monocytes Absolute: 0.6 10*3/uL (ref 0.1–1.0)
Monocytes Relative: 7 %
Neutro Abs: 5.9 10*3/uL (ref 1.7–7.7)
Neutrophils Relative %: 74 %
Platelets: 179 10*3/uL (ref 150–400)
RBC: 4.79 MIL/uL (ref 4.22–5.81)
RDW: 14.1 % (ref 11.5–15.5)
WBC: 8 10*3/uL (ref 4.0–10.5)
nRBC: 0 % (ref 0.0–0.2)

## 2023-10-31 LAB — MAGNESIUM: Magnesium: 2.2 mg/dL (ref 1.7–2.4)

## 2023-10-31 MED ORDER — BUDESONIDE 0.5 MG/2ML IN SUSP
0.5000 mg | Freq: Two times a day (BID) | RESPIRATORY_TRACT | Status: DC
  Administered 2023-10-31 – 2023-11-01 (×3): 0.5 mg via RESPIRATORY_TRACT
  Filled 2023-10-31 (×3): qty 2

## 2023-10-31 MED ORDER — ARFORMOTEROL TARTRATE 15 MCG/2ML IN NEBU
15.0000 ug | INHALATION_SOLUTION | Freq: Two times a day (BID) | RESPIRATORY_TRACT | Status: DC
  Administered 2023-10-31 – 2023-11-01 (×3): 15 ug via RESPIRATORY_TRACT
  Filled 2023-10-31 (×3): qty 2

## 2023-10-31 NOTE — Progress Notes (Signed)
  Progress Note   Patient: Zachary Shannon. EAV:[id number] DOB: 1951/07/17 DOA: 10/27/2023     4 DOS: the patient was seen and examined on 10/31/2023   Brief hospital course: 73 y.o. male with medical history significant of bladder cancer, diastolic dysfunction CHF, chronic kidney disease stage III, coronary artery disease, history of aortic insufficiency, atrial fibrillation on chronic anticoagulation, hyperlipidemia and recurrent UTIs presented with shortness of breath and cough along with hematuria.  On presentation, chest x-ray showed possible pneumonia; UA was consistent with UTI.  He required supplemental oxygen because of oxygen saturations of 89% on room air.  He was started on IV antibiotics.   Assessment and Plan: Acute resp failure w/ hypoxia due to R lower lobe CAP  - Pulmicort/brovana bid  - Azithromycin 500 mg PO daily  - IV ceftriaxone 2 g daily  - Atrovent neb tid  - Xopenextid and q6 hr PRN   Hematuria due to UTI/acute cystitis  - IV ceftriaxone as above   Hx of bladder CA  - Urostomy bag in place   HTN  - Cardizem 240 mg PO daily  - Lopressor 25 mg PO bid   PAF  - Lopressor as above  - Xarelto 20 mg PO daily   CKD stage 3a  - Resolved   Obesity class I - Complicating care   Subjective: Pt seen and examined at the bedside. Pt had some wheezing this morning and pulmicort/brovana was added. He is still on oxygen via [facility] (and does not use O2 at home).  Pt is agreeable to stay in the hospital today for antibx, breathing tx and oxygen weaning. Consider DC tmr (as he is very eager to go home).  Physical Exam: Vitals:   10/30/23 1944 10/31/23 0500 10/31/23 0506 10/31/23 0935  BP: 118/79  (!) 118/92   Pulse: 64  64   Resp: 16  16   Temp: 97.7 F (36.5 C)  97.9 F (36.6 C)   TempSrc: Oral  Oral   SpO2: 93%  93% 90%  Weight:  108.3 kg    Height:       Physical Exam HENT:     Head: Normocephalic.  Cardiovascular:     Rate and Rhythm: Normal rate and  regular rhythm.  Pulmonary:     Breath sounds: Wheezing present.  Abdominal:     Palpations: Abdomen is soft.  Musculoskeletal:        General: Normal range of motion.     Cervical back: Neck supple.  Skin:    General: Skin is warm.  Neurological:     Mental Status: He is alert and oriented to person, place, and time.  Psychiatric:        Mood and Affect: Mood normal.       Disposition: Status is: Inpatient Remains inpatient appropriate because: IV antibx & oxygen weaning   Planned Discharge Destination: Home    Time spent: 35 minutes  Author: Baron Hamper , MD 10/31/2023 10:17 AM  For on call review [email or web address].

## 2023-11-01 DIAGNOSIS — J189 Pneumonia, unspecified organism: Secondary | ICD-10-CM | POA: Diagnosis not present

## 2023-11-01 LAB — CBC
HCT: 52.2 % — ABNORMAL HIGH (ref 39.0–52.0)
Hemoglobin: 16.6 g/dL (ref 13.0–17.0)
MCH: 32.8 pg (ref 26.0–34.0)
MCHC: 31.8 g/dL (ref 30.0–36.0)
MCV: 103.2 fL — ABNORMAL HIGH (ref 80.0–100.0)
Platelets: 196 10*3/uL (ref 150–400)
RBC: 5.06 MIL/uL (ref 4.22–5.81)
RDW: 14.1 % (ref 11.5–15.5)
WBC: 7.8 10*3/uL (ref 4.0–10.5)
nRBC: 0 % (ref 0.0–0.2)

## 2023-11-01 LAB — COMPREHENSIVE METABOLIC PANEL WITH GFR
ALT: 22 U/L (ref 0–44)
AST: 17 U/L (ref 15–41)
Albumin: 3.4 g/dL — ABNORMAL LOW (ref 3.5–5.0)
Alkaline Phosphatase: 83 U/L (ref 38–126)
Anion gap: 9 (ref 5–15)
BUN: 21 mg/dL (ref 8–23)
CO2: 23 mmol/L (ref 22–32)
Calcium: 8.9 mg/dL (ref 8.9–10.3)
Chloride: 105 mmol/L (ref 98–111)
Creatinine, Ser: 1.07 mg/dL (ref 0.61–1.24)
GFR, Estimated: >60 mL/min (ref >60)
Glucose, Bld: 96 mg/dL (ref 70–99)
Potassium: 3.9 mmol/L (ref 3.5–5.1)
Sodium: 137 mmol/L (ref 135–145)
Total Bilirubin: 0.8 mg/dL (ref 0.0–1.2)
Total Protein: 7.2 g/dL (ref 6.5–8.1)

## 2023-11-01 LAB — C-REACTIVE PROTEIN: CRP: 0.7 mg/dL (ref <1.0)

## 2023-11-01 MED ORDER — AMOXICILLIN-POT CLAVULANATE 875-125 MG PO TABS: 1.0000 | ORAL_TABLET | Freq: Two times a day (BID) | ORAL | 0 refills | Status: AC

## 2023-11-01 MED ORDER — BUDESONIDE-FORMOTEROL FUMARATE 80-4.5 MCG/ACT IN AERO: 2.0000 | INHALATION_SPRAY | Freq: Two times a day (BID) | RESPIRATORY_TRACT | 0 refills | Status: AC

## 2023-11-01 MED ORDER — ALBUTEROL SULFATE HFA 108 (90 BASE) MCG/ACT IN AERS: 2.0000 | INHALATION_SPRAY | Freq: Four times a day (QID) | RESPIRATORY_TRACT | 0 refills | Status: AC | PRN

## 2023-11-01 NOTE — Progress Notes (Signed)
 SATURATION QUALIFICATIONS: (This note is used to comply with regulatory documentation for home oxygen)  Patient Saturations on Room Air at Rest = 95%  Patient Saturations on Room Air while Ambulating = 90%  Patient tolerated ambulating on room air without difficulty. He maintained his saturation above 88 %, and he denied any SOB, light-headedness, or difficulty ambulating. Provider notified via chat.

## 2023-11-01 NOTE — Plan of Care (Addendum)
 VSS. No complaints of pain. Patient remains on 2L [place]. LBM 4/9. No acute events overnight.  Problem: Education: Goal: Knowledge of General Education information will improve Description: Including pain rating scale, medication(s)/side effects and non-pharmacologic comfort measures Outcome: Progressing   Problem: Clinical Measurements: Goal: Ability to maintain clinical measurements within normal limits will improve Outcome: Progressing Goal: Will remain free from infection Outcome: Progressing   Problem: Safety: Goal: Ability to remain free from injury will improve Outcome: Progressing   Problem: Activity: Goal: Ability to tolerate increased activity will improve Outcome: Progressing   Problem: Clinical Measurements: Goal: Ability to maintain a body temperature in the normal range will improve Outcome: Progressing   Problem: Respiratory: Goal: Ability to maintain adequate ventilation will improve Outcome: Progressing Goal: Ability to maintain a clear airway will improve Outcome: Progressing   Problem: Activity: Goal: Ability to tolerate increased activity will improve Outcome: Progressing   Problem: Clinical Measurements: Goal: Ability to maintain a body temperature in the normal range will improve Outcome: Progressing   Problem: Respiratory: Goal: Ability to maintain adequate ventilation will improve Outcome: Progressing Goal: Ability to maintain a clear airway will improve Outcome: Progressing

## 2023-11-01 NOTE — Care Management Important Message (Signed)
 Important Message  Patient Details IM Letter given to the Patient. Name: Zachary Shannon. MRN: [id number] Date of Birth: 10-09-1950   Important Message Given:  Yes - Medicare IM     Caren Macadam 11/01/2023, 9:15 AM

## 2023-11-01 NOTE — Progress Notes (Signed)
 AVS reviewed w/ pt who verbalized an understanding - no other questions at this time- pt dressed for d/c-PIV removed by primary nurse.

## 2023-11-01 NOTE — Discharge Summary (Signed)
 Physician Discharge Summary   Patient: Zachary Shannon. MRN: [id number] DOB: 03/23/1951  Admit date:     10/27/2023  Discharge date: 11/01/23  Discharge Physician: Baron Hamper    PCP: Jarvis Morgan, DO    Discharge Diagnoses: Principal Problem:   CAP (community acquired pneumonia) Active Problems:   Bladder cancer (HCC)   AKI (acute kidney injury) (HCC)   PAF (paroxysmal atrial fibrillation) (HCC)   Hyperlipidemia   UTI (urinary tract infection)   Acute respiratory failure with hypoxia (HCC)  Resolved Problems:   * No resolved hospital problems. *  Hospital Course: 72 y.o. male with medical history significant of bladder cancer, diastolic dysfunction CHF, chronic kidney disease stage III, coronary artery disease, history of aortic insufficiency, atrial fibrillation on chronic anticoagulation, hyperlipidemia and recurrent UTIs presented with shortness of breath and cough along with hematuria.  On presentation, chest x-ray showed possible pneumonia; UA was consistent with UTI.  He required supplemental oxygen because of oxygen saturations of 89% on room air.  He was started on IV antibiotics for both R lower lobe CAP and UTI.  Pulmicort/brovana were added which significantly improved his breathing. WBC improved from 16 --> 7.8. Pt was requesting discharge home. He will be discharged home with 5 more days of PO antibx's, an albuterol inhaler and symbicort inhaler.    DISCHARGE MEDICATION: Allergies as of 11/01/2023   No Known Allergies      Medication List     TAKE these medications    acetaminophen 500 MG tablet Commonly known as: TYLENOL Take 1,000 mg by mouth every 6 (six) hours as needed for mild pain (pain score 1-3) or moderate pain (pain score 4-6).   albuterol 108 (90 Base) MCG/ACT inhaler Commonly known as: VENTOLIN HFA Inhale 2 puffs into the lungs every 6 (six) hours as needed for up to 7 days for wheezing or shortness of breath.   amoxicillin-clavulanate  875-125 MG tablet Commonly known as: AUGMENTIN Take 1 tablet by mouth 2 (two) times daily for 5 days.   atorvastatin 10 MG tablet Commonly known as: LIPITOR Take 10 mg by mouth at bedtime.   brompheniramine-pseudoephedrine-DM 30-2-10 MG/5ML syrup Take 30 mLs by mouth 4 (four) times daily as needed.   budesonide-formoterol 80-4.5 MCG/ACT inhaler Commonly known as: Symbicort Inhale 2 puffs into the lungs 2 (two) times daily.   Cholecalciferol 50 MCG (2000 UT) Caps Take 1 tablet by mouth daily.   cyanocobalamin 1000 MCG tablet Commonly known as: VITAMIN B12 Take 1,000 mcg by mouth every other day.   diltiazem 240 MG 24 hr capsule Commonly known as: DILACOR XR Take 240 mg by mouth at bedtime.   metoprolol tartrate 25 MG tablet Commonly known as: LOPRESSOR Take 25 mg by mouth 2 (two) times daily.   Xarelto 20 MG Tabs tablet Generic drug: rivaroxaban Take 20 mg by mouth at bedtime.        Discharge Exam: Filed Weights   10/30/23 0500 10/31/23 0500 11/01/23 0500  Weight: 108.1 kg 108.3 kg 106.7 kg   Physical Exam Constitutional:      Appearance: He is well-developed.  HENT:     Head: Normocephalic.  Cardiovascular:     Rate and Rhythm: Normal rate and regular rhythm.  Pulmonary:     Effort: Pulmonary effort is normal.     Breath sounds: Normal breath sounds.  Abdominal:     Palpations: Abdomen is soft.  Musculoskeletal:        General: Normal range of motion.  Skin:    General: Skin is warm.  Neurological:     Mental Status: He is alert and oriented to person, place, and time.  Psychiatric:        Mood and Affect: Mood normal.      Condition at discharge: fair  The results of significant diagnostics from this hospitalization (including imaging, microbiology, ancillary and laboratory) are listed below for reference.   Imaging Studies: CT ABDOMEN PELVIS W CONTRAST Result Date: 10/27/2023 CLINICAL DATA:  Hematuria, gross/macroscopic hx of bladder cancer  EXAM: CT ABDOMEN AND PELVIS WITH CONTRAST TECHNIQUE: Multidetector CT imaging of the abdomen and pelvis was performed using the standard protocol following bolus administration of intravenous contrast. RADIATION DOSE REDUCTION: This exam was performed according to the departmental dose-optimization program which includes automated exposure control, adjustment of the mA and/or kV according to patient size and/or use of iterative reconstruction technique. CONTRAST:  OMNIPAQUE IOHEXOL 300 MG/ML  SOLN COMPARISON:  CT 12/27/2022 FINDINGS: Lower chest: No basilar airspace disease or pleural effusion. There are coronary artery calcifications. Hepatobiliary: Mild diffuse hepatic steatosis. No focal liver abnormality. Layering gallstones or sludge in the gallbladder. No pericholecystic inflammation. No biliary dilatation. Pancreas: No ductal dilatation or inflammation. Spleen: Normal in size without focal abnormality. Adrenals/Urinary Tract: No adrenal nodule. No hydronephrosis or renal inflammation. No urolithiasis. Cortical scarring in the lower right kidney. Suspicious renal abnormality. Cystectomy with ileal conduit. The ileal conduit is nondilated, no evidence of inflammation. Stomach/Bowel: Unremarkable appearance of the stomach. Small duodenal diverticulum. Enteric sutures within small bowel in the pelvis. No obstruction or inflammation. Low lying cecum. Moderate colonic stool burden. Colonic diverticulosis, most prominent in the sigmoid. No diverticulitis or acute colonic inflammation. The appendix is not visualized. Vascular/Lymphatic: Aortic and branch atherosclerosis. No aortic aneurysm. The portal vein is patent. No enlarged lymph nodes in the abdomen or pelvis. Reproductive: Prostate is absent. Other: No free air or ascites. Small fat containing umbilical and supraumbilical fat containing ventral abdominal hernias. Fat in the left inguinal canal. Musculoskeletal: No acute osseous findings. Left greater  than right hip osteoarthritis. Degenerative change in the spine. IMPRESSION: 1. Cystectomy with ileal conduit. No urolithiasis or explanation for hematuria. 2. Colonic diverticulosis without diverticulitis. 3. Cholelithiasis or gallbladder sludge. 4. Hepatic steatosis. Aortic Atherosclerosis (ICD10-I70.0). Electronically Signed   By: Narda Rutherford M.D.   On: 10/27/2023 20:04   DG Chest 2 View Result Date: 10/27/2023 CLINICAL DATA:  Dyspnea EXAM: CHEST - 2 VIEW COMPARISON:  12/27/2022 FINDINGS: Focal opacity within the right lower lobe is in keeping with a focal pneumonic infiltrate in the appropriate clinical setting. No pneumothorax or pleural effusion. Cardiac size within normal limits. Osseous structures are age-appropriate. IMPRESSION: 1. Right lower lobe pneumonia. Follow-up chest radiograph is recommended in 3-4 weeks, following conservative therapy, to document resolution. If persistent at that time, dedicated contrast enhanced CT imaging of the chest is recommended for further evaluation. Electronically Signed   By: Helyn Numbers M.D.   On: 10/27/2023 19:12    Microbiology: Results for orders placed or performed during the hospital encounter of 10/27/23  Urine Culture     Status: Abnormal   Collection Time: 10/27/23  6:03 PM   Specimen: Urine, Clean Catch  Result Value Ref Range Status   Specimen Description   Final    URINE, CLEAN CATCH Performed at [facility], [address], [place], [place] [address]    Special Requests   Final    Normal Performed at [facility]  [facility], [address], [place], [place] [address]    Culture MULTIPLE SPECIES PRESENT, SUGGEST RECOLLECTION (A)  Final   Report Status 10/28/2023 FINAL  Final    Labs: CBC: Recent Labs  Lab 10/27/23 1804 10/28/23 0528 10/29/23 0523 10/30/23 0541 10/31/23 0513 11/01/23 0515  WBC 9.6 6.8 16.5* 10.3 8.0 7.8  NEUTROABS 8.1*  --  14.4* 8.4* 5.9  --   HGB 17.3* 15.9 14.4 14.9  15.9 16.6  HCT 53.2* 50.5 46.9 47.3 51.2 52.2*  MCV 101.9* 104.6* 106.1* 105.1* 106.9* 103.2*  PLT 166 156 176 176 179 196   Basic Metabolic Panel: Recent Labs  Lab 10/28/23 0528 10/29/23 0523 10/30/23 0541 10/31/23 0513 11/01/23 0515  NA 139 142 139 134* 137  K 4.7 4.3 3.9 3.8 3.9  CL 105 110 107 102 105  CO2 24 24 24 23 23   GLUCOSE 180* 101* 81 77 96  BUN 28* 33* 25* 22 21  CREATININE 1.32* 1.21 1.08 1.21 1.07  CALCIUM 8.7* 8.6* 8.5* 8.9 8.9  MG  --  2.2 2.0 2.2  --    Liver Function Tests: Recent Labs  Lab 10/27/23 1804 10/28/23 0528 11/01/23 0515  AST 28 21 17   ALT 16 14 22   ALKPHOS 100 85 83  BILITOT 1.6* 0.8 0.8  PROT 8.3* 7.4 7.2  ALBUMIN 4.0 3.3* 3.4*   CBG: No results for input(s): "GLUCAP" in the last 168 hours.  Discharge time spent: greater than 30 minutes.  Signed: Baron Hamper , MD [facility] 11/01/2023
